# Patient Record
Sex: Female | Born: 1950
Health system: Southern US, Community
[De-identification: ages and names within clinical notes are randomized; demographics above are authoritative.]

## PROBLEM LIST (undated history)

## (undated) DIAGNOSIS — E559 Vitamin D deficiency, unspecified: Secondary | ICD-10-CM

## (undated) DIAGNOSIS — H269 Unspecified cataract: Secondary | ICD-10-CM

## (undated) DIAGNOSIS — E876 Hypokalemia: Secondary | ICD-10-CM

## (undated) DIAGNOSIS — J302 Other seasonal allergic rhinitis: Secondary | ICD-10-CM

## (undated) DIAGNOSIS — E785 Hyperlipidemia, unspecified: Secondary | ICD-10-CM

## (undated) DIAGNOSIS — Z5189 Encounter for other specified aftercare: Secondary | ICD-10-CM

## (undated) DIAGNOSIS — R011 Cardiac murmur, unspecified: Secondary | ICD-10-CM

## (undated) DIAGNOSIS — I10 Essential (primary) hypertension: Secondary | ICD-10-CM

## (undated) DIAGNOSIS — H9191 Unspecified hearing loss, right ear: Secondary | ICD-10-CM

## (undated) HISTORY — DX: Unspecified hearing loss, right ear: H91.91

## (undated) HISTORY — DX: Cardiac murmur, unspecified: R01.1

## (undated) HISTORY — DX: Other seasonal allergic rhinitis: J30.2

## (undated) HISTORY — DX: Vitamin D deficiency, unspecified: E55.9

## (undated) HISTORY — DX: Hypokalemia: E87.6

## (undated) HISTORY — PX: BUNIONECTOMY: SHX129

## (undated) HISTORY — DX: Hyperlipidemia, unspecified: E78.5

## (undated) HISTORY — DX: Encounter for other specified aftercare: Z51.89

## (undated) HISTORY — PX: COLONOSCOPY: SHX174

## (undated) HISTORY — PX: APPENDECTOMY: SHX54

## (undated) HISTORY — DX: Unspecified cataract: H26.9

---

## 1998-06-29 ENCOUNTER — Encounter (INDEPENDENT_AMBULATORY_CARE_PROVIDER_SITE_OTHER): Payer: Self-pay | Admitting: *Deleted

## 1998-07-07 ENCOUNTER — Encounter: Admission: RE | Admit: 1998-07-07 | Discharge: 1998-07-07 | Payer: Self-pay | Admitting: Family Medicine

## 1998-07-28 ENCOUNTER — Encounter: Admission: RE | Admit: 1998-07-28 | Discharge: 1998-07-28 | Payer: Self-pay | Admitting: Family Medicine

## 1998-08-12 ENCOUNTER — Encounter: Admission: RE | Admit: 1998-08-12 | Discharge: 1998-08-12 | Payer: Self-pay | Admitting: Family Medicine

## 1998-11-30 ENCOUNTER — Encounter: Admission: RE | Admit: 1998-11-30 | Discharge: 1998-11-30 | Payer: Self-pay | Admitting: Family Medicine

## 1998-12-15 ENCOUNTER — Encounter: Admission: RE | Admit: 1998-12-15 | Discharge: 1998-12-15 | Payer: Self-pay | Admitting: Family Medicine

## 1999-05-17 ENCOUNTER — Encounter: Admission: RE | Admit: 1999-05-17 | Discharge: 1999-05-17 | Payer: Self-pay | Admitting: Family Medicine

## 1999-09-17 ENCOUNTER — Emergency Department (HOSPITAL_COMMUNITY): Admission: EM | Admit: 1999-09-17 | Discharge: 1999-09-17 | Payer: Self-pay

## 2000-01-11 ENCOUNTER — Encounter: Admission: RE | Admit: 2000-01-11 | Discharge: 2000-01-11 | Payer: Self-pay | Admitting: Family Medicine

## 2000-02-07 ENCOUNTER — Encounter: Admission: RE | Admit: 2000-02-07 | Discharge: 2000-02-07 | Payer: Self-pay | Admitting: Family Medicine

## 2000-04-21 ENCOUNTER — Encounter: Payer: Self-pay | Admitting: Emergency Medicine

## 2000-04-21 ENCOUNTER — Emergency Department (HOSPITAL_COMMUNITY): Admission: EM | Admit: 2000-04-21 | Discharge: 2000-04-21 | Payer: Self-pay | Admitting: Emergency Medicine

## 2001-10-13 ENCOUNTER — Emergency Department (HOSPITAL_COMMUNITY): Admission: EM | Admit: 2001-10-13 | Discharge: 2001-10-13 | Payer: Self-pay

## 2003-09-20 ENCOUNTER — Emergency Department (HOSPITAL_COMMUNITY): Admission: EM | Admit: 2003-09-20 | Discharge: 2003-09-20 | Payer: Self-pay | Admitting: Emergency Medicine

## 2003-12-31 ENCOUNTER — Emergency Department (HOSPITAL_COMMUNITY): Admission: EM | Admit: 2003-12-31 | Discharge: 2003-12-31 | Payer: Self-pay | Admitting: *Deleted

## 2004-02-29 ENCOUNTER — Ambulatory Visit (HOSPITAL_COMMUNITY): Admission: RE | Admit: 2004-02-29 | Discharge: 2004-02-29 | Payer: Self-pay | Admitting: Family Medicine

## 2004-05-11 ENCOUNTER — Ambulatory Visit (HOSPITAL_COMMUNITY): Admission: RE | Admit: 2004-05-11 | Discharge: 2004-05-11 | Payer: Self-pay | Admitting: Family Medicine

## 2004-08-04 ENCOUNTER — Ambulatory Visit: Payer: Self-pay | Admitting: *Deleted

## 2004-08-16 ENCOUNTER — Ambulatory Visit: Payer: Self-pay | Admitting: Family Medicine

## 2004-08-18 ENCOUNTER — Ambulatory Visit: Payer: Self-pay | Admitting: Family Medicine

## 2004-09-18 ENCOUNTER — Ambulatory Visit: Payer: Self-pay | Admitting: Family Medicine

## 2004-09-28 ENCOUNTER — Ambulatory Visit: Payer: Self-pay | Admitting: Family Medicine

## 2004-12-19 ENCOUNTER — Ambulatory Visit: Payer: Self-pay | Admitting: Family Medicine

## 2005-01-25 ENCOUNTER — Ambulatory Visit: Payer: Self-pay | Admitting: Family Medicine

## 2005-02-19 ENCOUNTER — Ambulatory Visit: Payer: Self-pay | Admitting: Internal Medicine

## 2005-03-09 ENCOUNTER — Ambulatory Visit: Payer: Self-pay | Admitting: Family Medicine

## 2005-06-13 ENCOUNTER — Ambulatory Visit: Payer: Self-pay | Admitting: Family Medicine

## 2005-09-11 ENCOUNTER — Ambulatory Visit: Payer: Self-pay | Admitting: Family Medicine

## 2005-09-26 ENCOUNTER — Ambulatory Visit: Payer: Self-pay | Admitting: Internal Medicine

## 2005-11-29 ENCOUNTER — Ambulatory Visit: Payer: Self-pay | Admitting: Family Medicine

## 2006-02-26 ENCOUNTER — Emergency Department (HOSPITAL_COMMUNITY): Admission: EM | Admit: 2006-02-26 | Discharge: 2006-02-26 | Payer: Self-pay | Admitting: Emergency Medicine

## 2006-03-05 ENCOUNTER — Ambulatory Visit: Payer: Self-pay | Admitting: Family Medicine

## 2006-06-11 ENCOUNTER — Emergency Department (HOSPITAL_COMMUNITY): Admission: EM | Admit: 2006-06-11 | Discharge: 2006-06-11 | Payer: Self-pay | Admitting: Emergency Medicine

## 2006-10-08 ENCOUNTER — Emergency Department (HOSPITAL_COMMUNITY): Admission: EM | Admit: 2006-10-08 | Discharge: 2006-10-08 | Payer: Self-pay | Admitting: Emergency Medicine

## 2006-12-27 ENCOUNTER — Encounter (INDEPENDENT_AMBULATORY_CARE_PROVIDER_SITE_OTHER): Payer: Self-pay | Admitting: *Deleted

## 2009-10-24 ENCOUNTER — Encounter: Payer: Self-pay | Admitting: Family Medicine

## 2009-11-24 ENCOUNTER — Encounter: Payer: Self-pay | Admitting: *Deleted

## 2010-11-19 ENCOUNTER — Encounter: Payer: Self-pay | Admitting: Internal Medicine

## 2010-11-28 NOTE — Miscellaneous (Signed)
Summary: Do Not Reschedule  Pt missed NP appt.  Per [facility] policy is not allowed to reschedule.  Dennison Nancy RN  November 24, 2009 10:40 AM

## 2015-02-18 ENCOUNTER — Encounter (HOSPITAL_COMMUNITY): Payer: Self-pay | Admitting: Emergency Medicine

## 2015-02-18 ENCOUNTER — Emergency Department (HOSPITAL_COMMUNITY): Payer: Self-pay

## 2015-02-18 ENCOUNTER — Emergency Department (HOSPITAL_COMMUNITY)
Admission: EM | Admit: 2015-02-18 | Discharge: 2015-02-18 | Disposition: A | Discharge status: Home / Self Care | Payer: Self-pay | Attending: Emergency Medicine | Admitting: Emergency Medicine

## 2015-02-18 DIAGNOSIS — Z9889 Other specified postprocedural states: Secondary | ICD-10-CM | POA: Insufficient documentation

## 2015-02-18 DIAGNOSIS — Z87891 Personal history of nicotine dependence: Secondary | ICD-10-CM | POA: Insufficient documentation

## 2015-02-18 DIAGNOSIS — R079 Chest pain, unspecified: Secondary | ICD-10-CM | POA: Insufficient documentation

## 2015-02-18 DIAGNOSIS — I10 Essential (primary) hypertension: Secondary | ICD-10-CM | POA: Insufficient documentation

## 2015-02-18 DIAGNOSIS — J069 Acute upper respiratory infection, unspecified: Secondary | ICD-10-CM | POA: Insufficient documentation

## 2015-02-18 HISTORY — DX: Essential (primary) hypertension: I10

## 2015-02-18 MED ORDER — BENZONATATE 100 MG PO CAPS: 100.0000 mg | ORAL_CAPSULE | Freq: Three times a day (TID) | ORAL | Status: DC

## 2015-02-18 NOTE — ED Provider Notes (Signed)
CSN: [id number]     Arrival date & time 02/18/15  8341 History   First MD Initiated Contact with Patient 02/18/15 0901     Chief Complaint  Patient presents with  . Cough     (Consider location/radiation/quality/duration/timing/severity/associated sxs/prior Treatment) HPI Comments: Patient presents with cough. She states the cough started 2 days ago. Spin worsening since that time. Her cough is productive of some yellow sputum. She has some soreness in the center of her chest which she describes as a sharp pain that only happens with the coughing. She denies any pain with breathing. She denies any shortness of breath. She denies any fevers but she is having some chills. She has been using some Mucinex without relief. She denies any leg pain or swelling. She denies any history of lung disease. She's a former smoker but quit smoking about 6 years ago.  Patient is a 64 y.o. female presenting with cough.  Cough Associated symptoms: chest pain   Associated symptoms: no chills, no diaphoresis, no fever, no headaches, no rash, no rhinorrhea and no shortness of breath     Past Medical History  Diagnosis Date  . Hypertension    History reviewed. No pertinent past surgical history. No family history on file. History  Substance Use Topics  . Smoking status: Former Smoker -- 2.00 packs/day for 20 years    Types: Cigarettes    Quit date: 01/27/2009  . Smokeless tobacco: Not on file  . Alcohol Use: No   OB History    No data available     Review of Systems  Constitutional: Negative for fever, chills, diaphoresis and fatigue.  HENT: Negative for congestion, rhinorrhea and sneezing.   Eyes: Negative.   Respiratory: Positive for cough. Negative for chest tightness and shortness of breath.   Cardiovascular: Positive for chest pain. Negative for leg swelling.  Gastrointestinal: Negative for nausea, vomiting, abdominal pain, diarrhea and blood in stool.  Genitourinary: Negative for frequency,  hematuria, flank pain and difficulty urinating.  Musculoskeletal: Negative for back pain and arthralgias.  Skin: Negative for rash.  Neurological: Negative for dizziness, speech difficulty, weakness, numbness and headaches.      Allergies  Review of patient's allergies indicates no known allergies.  Home Medications   Prior to Admission medications   Medication Sig Start Date End Date Taking? Authorizing Provider  dextromethorphan-guaiFENesin (MUCINEX DM) 30-600 MG per 12 hr tablet Take 1 tablet by mouth every 12 (twelve) hours as needed for cough (congestion).    Yes Historical Provider, MD  hydrochlorothiazide (HYDRODIURIL) 25 MG tablet Take 25 mg by mouth daily.   Yes Historical Provider, MD  naproxen sodium (ANAPROX) 220 MG tablet Take 220 mg by mouth every 6 (six) hours as needed (headache).   Yes Historical Provider, MD  pravastatin (PRAVACHOL) 80 MG tablet Take 80 mg by mouth daily.   Yes Historical Provider, MD  verapamil (VERELAN PM) 360 MG 24 hr capsule Take 360 mg by mouth daily.   Yes Historical Provider, MD  benzonatate (TESSALON) 100 MG capsule Take 1 capsule (100 mg total) by mouth every 8 (eight) hours. 02/18/15   Malvin Johns, MD   BP 121/74 mmHg  Pulse 90  Temp(Src) 98.5 F (36.9 C) (Oral)  Resp 21  Ht 5\' 4"  (1.626 m)  Wt 209 lb (94.802 kg)  BMI 35.86 kg/m2  SpO2 94% Physical Exam  Constitutional: She is oriented to person, place, and time. She appears well-developed and well-nourished.  HENT:  Head: Normocephalic and atraumatic.  Right Ear: External ear normal.  Left Ear: External ear normal.  Mouth/Throat: Oropharynx is clear and moist.  Eyes: Pupils are equal, round, and reactive to light.  Neck: Normal range of motion. Neck supple.  Cardiovascular: Normal rate, regular rhythm and normal heart sounds.   Pulmonary/Chest: Effort normal and breath sounds normal. No respiratory distress. She has no wheezes. She has no rales. She exhibits tenderness (mild  tenderness to the center of her chest on palpation).  Abdominal: Soft. Bowel sounds are normal. There is no tenderness. There is no rebound and no guarding.  Musculoskeletal: Normal range of motion. She exhibits no edema.  No edema or calf tenderness  Lymphadenopathy:    She has no cervical adenopathy.  Neurological: She is alert and oriented to person, place, and time.  Skin: Skin is warm and dry. No rash noted.  Psychiatric: She has a normal mood and affect.    ED Course  Procedures (including critical care time) Labs Review Labs Reviewed - No data to display  Imaging Review Dg Chest 2 View  02/18/2015   CLINICAL DATA:  64 year old female with a history of cough  EXAM: CHEST - 2 VIEW  COMPARISON:  No prior chest x-ray  FINDINGS: Cardiomediastinal silhouette within normal limits in size and contour.  Atherosclerotic calcification of the aortic arch.  No pulmonary vascular congestion. No pneumothorax. No pleural effusion. No confluent airspace disease.  No displaced fracture.  Unremarkable appearance of the upper abdomen.  IMPRESSION: No radiographic evidence of acute cardiopulmonary disease.  Atherosclerosis.  Signed,  Dulcy Fanny. Earleen Newport, DO  [facility]  [facility]   Electronically Signed   By: Corrie Mckusick D.O.   On: 02/18/2015 09:47     EKG Interpretation   Date/Time:  Friday February 18 2015 09:10:41 EDT Ventricular Rate:  100 PR Interval:  127 QRS Duration: 90 QT Interval:  388 QTC Calculation: 500 R Axis:   72 Text Interpretation:  Sinus tachycardia Borderline T abnormalities,  anterior leads Prolonged QT interval No old tracing to compare Confirmed  by Caliegh Middlekauff  MD, Kayvion Arneson ([id number]) on 02/18/2015 9:21:43 AM      MDM   Final diagnoses:  URI (upper respiratory infection)    Patient has no evidence of pneumonia. Her lungs are clear on exam. She has normal oxygen saturations. Her chest pain is reproducible and her EKG does not show  any ischemic changes. She has no suggestions of a PE. She likely has upper respiratory infection. She was discharged home in good condition. She was given prescription for [employer]. She was advised that she can continue using the Mucinex. She was advised to follow-up with her primary care physician if her symptoms are not improving or return here as needed for any worsening symptoms.    Malvin Johns, MD 02/18/15 [phone]

## 2015-02-18 NOTE — ED Notes (Addendum)
Pt states her symptoms started 2 days ago.  She has a cough associated with some mucous. The pain she states in her chest radiates to lower back, and she feels its related to her coughing.  She denies any SOB. Pt is alert and orientated.  Pt reports no BN & V.  She has taken Mucinex with no relief

## 2015-02-18 NOTE — ED Notes (Signed)
Patient is alert and orientated to discharge instructions.  She rates her pain score to a 5 in chest but it is from coughing.

## 2015-02-18 NOTE — Discharge Instructions (Signed)
Cough, Adult  A cough is a reflex that helps clear your throat and airways. It can help heal the body or may be a reaction to an irritated airway. A cough may only last 2 or 3 weeks (acute) or may last more than 8 weeks (chronic).  CAUSES Acute cough:  Viral or bacterial infections. Chronic cough:  Infections.  Allergies.  Asthma.  Post-nasal drip.  Smoking.  Heartburn or acid reflux.  Some medicines.  Chronic lung problems (COPD).  Cancer. SYMPTOMS   Cough.  Fever.  Chest pain.  Increased breathing rate.  High-pitched whistling sound when breathing (wheezing).  Colored mucus that you cough up (sputum). TREATMENT   A bacterial cough may be treated with antibiotic medicine.  A viral cough must run its course and will not respond to antibiotics.  Your caregiver may recommend other treatments if you have a chronic cough. HOME CARE INSTRUCTIONS   Only take over-the-counter or prescription medicines for pain, discomfort, or fever as directed by your caregiver. Use cough suppressants only as directed by your caregiver.  Use a cold steam vaporizer or humidifier in your bedroom or home to help loosen secretions.  Sleep in a semi-upright position if your cough is worse at night.  Rest as needed.  Stop smoking if you smoke. SEEK IMMEDIATE MEDICAL CARE IF:   You have pus in your sputum.  Your cough starts to worsen.  You cannot control your cough with suppressants and are losing sleep.  You begin coughing up blood.  You have difficulty breathing.  You develop pain which is getting worse or is uncontrolled with medicine.  You have a fever. MAKE SURE YOU:   Understand these instructions.  Will watch your condition.  Will get help right away if you are not doing well or get worse. Document Released: 04/13/2011 Document Revised: 01/07/2012 Document Reviewed: 04/13/2011 ExitCare Patient Information 2015 [employer]. This information is not intended  to replace advice given to you by your health care provider. Make sure you discuss any questions you have with your health care provider.  

## 2015-12-05 DIAGNOSIS — E669 Obesity, unspecified: Secondary | ICD-10-CM | POA: Insufficient documentation

## 2016-05-29 DIAGNOSIS — R7303 Prediabetes: Secondary | ICD-10-CM | POA: Insufficient documentation

## 2016-05-29 DIAGNOSIS — M545 Low back pain, unspecified: Secondary | ICD-10-CM | POA: Insufficient documentation

## 2016-07-09 ENCOUNTER — Emergency Department (HOSPITAL_COMMUNITY): Payer: Self-pay

## 2016-07-09 ENCOUNTER — Encounter (HOSPITAL_COMMUNITY): Payer: Self-pay

## 2016-07-09 DIAGNOSIS — S92312A Displaced fracture of first metatarsal bone, left foot, initial encounter for closed fracture: Secondary | ICD-10-CM | POA: Insufficient documentation

## 2016-07-09 DIAGNOSIS — Y9389 Activity, other specified: Secondary | ICD-10-CM | POA: Insufficient documentation

## 2016-07-09 DIAGNOSIS — Y999 Unspecified external cause status: Secondary | ICD-10-CM | POA: Insufficient documentation

## 2016-07-09 DIAGNOSIS — Z7982 Long term (current) use of aspirin: Secondary | ICD-10-CM | POA: Insufficient documentation

## 2016-07-09 DIAGNOSIS — S82492A Other fracture of shaft of left fibula, initial encounter for closed fracture: Secondary | ICD-10-CM | POA: Insufficient documentation

## 2016-07-09 DIAGNOSIS — Z87891 Personal history of nicotine dependence: Secondary | ICD-10-CM | POA: Insufficient documentation

## 2016-07-09 DIAGNOSIS — S92325A Nondisplaced fracture of second metatarsal bone, left foot, initial encounter for closed fracture: Secondary | ICD-10-CM | POA: Insufficient documentation

## 2016-07-09 DIAGNOSIS — I10 Essential (primary) hypertension: Secondary | ICD-10-CM | POA: Insufficient documentation

## 2016-07-09 DIAGNOSIS — Y92009 Unspecified place in unspecified non-institutional (private) residence as the place of occurrence of the external cause: Secondary | ICD-10-CM | POA: Insufficient documentation

## 2016-07-09 DIAGNOSIS — Z79899 Other long term (current) drug therapy: Secondary | ICD-10-CM | POA: Insufficient documentation

## 2016-07-09 DIAGNOSIS — S92332A Displaced fracture of third metatarsal bone, left foot, initial encounter for closed fracture: Secondary | ICD-10-CM | POA: Insufficient documentation

## 2016-07-09 DIAGNOSIS — W010XXA Fall on same level from slipping, tripping and stumbling without subsequent striking against object, initial encounter: Secondary | ICD-10-CM | POA: Insufficient documentation

## 2016-07-09 NOTE — ED Triage Notes (Signed)
Pt tripped over the gas pump this evening and she complains of left ankle and foot pain

## 2016-07-10 ENCOUNTER — Emergency Department (HOSPITAL_COMMUNITY): Payer: Self-pay

## 2016-07-10 ENCOUNTER — Emergency Department (HOSPITAL_COMMUNITY)
Admission: EM | Admit: 2016-07-10 | Discharge: 2016-07-10 | Disposition: A | Discharge status: Home / Self Care | Payer: Self-pay | Attending: Emergency Medicine | Admitting: Emergency Medicine

## 2016-07-10 ENCOUNTER — Other Ambulatory Visit: Payer: Self-pay | Admitting: Orthopaedic Surgery

## 2016-07-10 DIAGNOSIS — S92302A Fracture of unspecified metatarsal bone(s), left foot, initial encounter for closed fracture: Secondary | ICD-10-CM

## 2016-07-10 DIAGNOSIS — S82402A Unspecified fracture of shaft of left fibula, initial encounter for closed fracture: Secondary | ICD-10-CM

## 2016-07-10 MED ORDER — OXYCODONE-ACETAMINOPHEN 5-325 MG PO TABS
1.0000 | ORAL_TABLET | Freq: Once | ORAL | Status: AC
  Administered 2016-07-10: 1 via ORAL
  Filled 2016-07-10: qty 1

## 2016-07-10 MED ORDER — OXYCODONE-ACETAMINOPHEN 5-325 MG PO TABS: 2.0000 | ORAL_TABLET | ORAL | 0 refills | Status: DC | PRN

## 2016-07-10 MED ORDER — OXYCODONE-ACETAMINOPHEN 5-325 MG PO TABS
1.0000 | ORAL_TABLET | Freq: Once | ORAL | Status: AC
Start: 2016-07-10 — End: 2016-07-10
  Administered 2016-07-10: 1 via ORAL
  Filled 2016-07-10: qty 1

## 2016-07-10 MED ORDER — IBUPROFEN 200 MG PO TABS
600.0000 mg | ORAL_TABLET | Freq: Once | ORAL | Status: AC
Start: 2016-07-10 — End: 2016-07-10
  Administered 2016-07-10: 600 mg via ORAL
  Filled 2016-07-10: qty 3

## 2016-07-10 NOTE — ED Provider Notes (Signed)
[facility] DEPT Provider Note   CSN: [id number] Arrival date & time: 07/09/16  2249  By signing my name below, I, Ephriam Jenkins, attest that this documentation has been prepared under the direction and in the presence of [employer].  Electronically Signed: Ephriam Jenkins, ED Scribe. 07/10/16. 1:15 AM.  History   Chief Complaint Chief Complaint  Patient presents with  . Foot Pain    HPI HPI Comments: Teresa Collier is a 64 y.o. female who presents to the Emergency Department s/p an injury to her left foot approximately two hours ago. 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 She currently reports significant pain and swelling to her left foot with exacerbating pain upon ROM. She also reports exacerbated pain while bearing weight. Pt denies any deceased sensation of the extremity.      Past Medical History:  Diagnosis Date  . Hypertension     There are no active problems to display for this patient.   History reviewed. No pertinent surgical history.  OB History    No data available       Home Medications    Prior to Admission medications   Medication Sig Start Date End Date Taking? Authorizing Provider  aspirin-acetaminophen-caffeine (EXCEDRIN MIGRAINE) [phone] MG tablet Take 1-2 tablets by mouth every 6 (six) hours as needed for headache.   Yes Historical Provider, MD  hydrochlorothiazide (HYDRODIURIL) 25 MG tablet Take 25 mg by mouth daily.   Yes Historical Provider, MD  Multiple Vitamin (MULTIVITAMIN WITH MINERALS) TABS tablet Take 1 tablet by mouth daily.   Yes Historical Provider, MD  pravastatin (PRAVACHOL) 80 MG tablet Take 80 mg by mouth daily.   Yes Historical Provider, MD  verapamil (VERELAN PM) 360 MG 24 hr capsule Take 360 mg by mouth daily.   Yes Historical Provider, MD  oxyCODONE-acetaminophen (PERCOCET/ROXICET) 5-325 MG tablet Take 2 tablets by mouth every 4 (four) hours as  needed for severe pain. 07/10/16   Okey Regal, PA-C    Family History History reviewed. No pertinent family history.  Social History Social History  Substance Use Topics  . Smoking status: Former Smoker    Packs/day: 2.00    Years: 20.00    Types: Cigarettes    Quit date: 01/27/2009  . Smokeless tobacco: Never Used  . Alcohol use No     Allergies   Review of patient's allergies indicates no known allergies.   Review of Systems Review of Systems  Musculoskeletal: Positive for arthralgias (left foot, left ankle) and joint swelling (left ankle).  Neurological: Negative for numbness.  All other systems reviewed and are negative.    Physical Exam Updated Vital Signs BP 158/78 (BP Location: Left Arm)   Pulse 84   Temp 98.2 F (36.8 C) (Oral)   Resp 20   Ht 5\' 3"  (1.6 m)   Wt 180 lb (81.6 kg)   SpO2 97%   BMI 31.89 kg/m   Physical Exam  Constitutional: She is oriented to person, place, and time. She appears well-developed and well-nourished. No distress.  HENT:  Head: Normocephalic and atraumatic.  Neck: Normal range of motion.  Pulmonary/Chest: Effort normal.  Musculoskeletal: She exhibits edema and tenderness.  Significant swelling to left dorsal foot and lateral ankle. ROM intact. TTP of the dorsal foot and lateral ankle. Cap refill less than 3 sec. No signs of compartment syndrome. Sensation intact. No TTP of proximal fibula  Neurological: She is  alert and oriented to person, place, and time.  Skin: Skin is warm and dry. She is not diaphoretic.  Psychiatric: She has a normal mood and affect. Judgment normal.  Nursing note and vitals reviewed.    ED Treatments / Results  DIAGNOSTIC STUDIES: Oxygen Saturation is 97% on RA, normal by my interpretation.  COORDINATION OF CARE: 1:04 AM-Will order imaging of the foot. Discussed treatment plan with pt at bedside and pt agreed to plan.   Labs (all labs ordered are listed, but only abnormal results are  displayed) Labs Reviewed - No data to display  EKG  EKG Interpretation None       Radiology Dg Ankle Complete Left  Result Date: 07/09/2016 CLINICAL DATA:  Trip and fall injury at home tonight. EXAM: LEFT ANKLE COMPLETE - 3+ VIEW COMPARISON:  None. FINDINGS: There is an acute oblique distal fibular fracture with 1 cortex width posterolateral displacement. Good alignment. Mortise is symmetric. No other acute fracture is evident. IMPRESSION: Acute fracture of the distal fibula Electronically Signed   By: Andreas Newport M.D.   On: 07/09/2016 23:49   Ct Foot Left Wo Contrast  Result Date: 07/10/2016 CLINICAL DATA:  Tripped over gas pump this evening. EXAM: CT OF THE LEFT FOOT WITHOUT CONTRAST TECHNIQUE: Multidetector CT imaging of the left foot was performed according to the standard protocol. Multiplanar CT image reconstructions were also generated. COMPARISON:  None. FINDINGS: There is an acute nondisplaced posterior malleolar fracture in the coronal plane. Distal fibular fracture is again evident with 1 mm fracture fragment separation. Subtalar joints are intact.  Talus and calcaneus are intact. Minute nondisplaced chip fracture at the dorsal lateral margin of the cuboid. Mildly comminuted first metatarsal base fracture with 9 mm dorsal-medial displacement and subluxation at the first tarsometatarsal articulation. There also is a nondisplaced well aligned second metatarsal base fracture without disruption of the second tarsometatarsal articulation. The other metatarsal bases and tarsometatarsal articulations are intact. Mildly impacted fractures of the second and third metatarsal necks. IMPRESSION: 1. Mildly comminuted first metatarsal base fracture with dorsal-medial articular distraction of the first tarsometatarsal joint. The other tarsometatarsal joints are intact although there is a nondisplaced second metatarsal base fracture. 2. Mildly impacted fractures of the second and third metatarsal  necks. 3. Coronal fractures through the distal fibula and posterior malleolus. Electronically Signed   By: Andreas Newport M.D.   On: 07/10/2016 04:36   Dg Foot Complete Left  Result Date: 07/10/2016 CLINICAL DATA:  Foot pain and swelling. Injury, trip and fall over a gas line. Bruising and swelling. Known ankle fracture. EXAM: LEFT FOOT - COMPLETE 3+ VIEW COMPARISON:  Ankle radiographs 2 hours prior FINDINGS: Displaced proximal first metatarsal fracture without 6 mm of osseous distraction distally, mild comminution proximally. Fracture involves the tarsal metatarsal articulation, with likely 1 cm articular distraction. Comminuted fracture of the second metatarsal head neck extends into the metatarsal phalangeal joint. Impaction fracture of the third metatarsal neck without definite intra-articular extension. Dorsal soft tissue edema is seen. Distal fibular fractures better assessed on ankle exam. IMPRESSION: 1. Displaced mildly comminuted intra-articular fracture proximal first metatarsal involving the tarsal metatarsal joint. 2. Second and third metatarsal neck fractures, second metatarsal fracture extends to the metatarsal phalangeal joint. Electronically Signed   By: Jeb Levering M.D.   On: 07/10/2016 01:48    Procedures Procedures (including critical care time)  Medications Ordered in ED Medications  ibuprofen (ADVIL,MOTRIN) tablet 600 mg (600 mg Oral Given 07/10/16 0145)  oxyCODONE-acetaminophen (PERCOCET/ROXICET) 5-325  MG per tablet 1 tablet (1 tablet Oral Given 07/10/16 0145)  oxyCODONE-acetaminophen (PERCOCET/ROXICET) 5-325 MG per tablet 1 tablet (1 tablet Oral Given 07/10/16 0519)     Initial Impression / Assessment and Plan / ED Course  I have reviewed the triage vital signs and the nursing notes.  Pertinent labs & imaging results that were available during my care of the patient were reviewed by me and considered in my medical decision making (see chart for details).  Clinical  Course     Final Clinical Impressions(s) / ED Diagnoses   Final diagnoses:  Metatarsal fracture, left, closed, initial encounter  Fibula fracture, left, closed, initial encounter   Labs:  Imaging:CT foot, DG foot, DG ankle  Consults: XU  Therapeutics: Percocet  Discharge Meds: Percocet  Assessment/Plan:  65 year old female presents today with significant lower extremity fractures. Patient has 3 metatarsal fractures with intra-articular articular involvement with questionable Lisfranc injury. Patient also has distal fibula fracture. Patient consult with on-call orthopedics Dr. Erlinda Hong who instructed to have CT foot, Jones dressing and follow-up in the clinic. Patient has no signs of compartment syndrome, she will be discharged home with close follow-up, and strict return precautions. She verbalized understanding and agreement to today's plan had no further questions or concerns at time of discharge    New Prescriptions Discharge Medication List as of 07/10/2016  4:53 AM    I personally performed the services described in this documentation, which was scribed in my presence. The recorded information has been reviewed and is accurate.    Okey Regal, PA-C 07/10/16 0548    Veatrice Kells, MD 07/10/16 [phone]

## 2016-07-10 NOTE — ED Notes (Signed)
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 Pt reports pain with movement and unable to bear weight.

## 2016-07-10 NOTE — ED Notes (Signed)
Bulky jones dressing applied to left foot/ankle.

## 2016-07-10 NOTE — ED Notes (Signed)
Pt reporting improvement in pain currently awaiting consult with Orthopedics.

## 2016-07-10 NOTE — ED Notes (Signed)
Verbal order from Angie PA for Motrin 600mg  PO and Percocet 5/325mg  PO

## 2016-07-10 NOTE — Discharge Instructions (Signed)
Please contact Dr. Erlinda Hong today and schedule follow-up evaluation. Please return the emergency room if any new or worsening signs or symptoms present

## 2016-07-11 ENCOUNTER — Encounter (HOSPITAL_BASED_OUTPATIENT_CLINIC_OR_DEPARTMENT_OTHER): Payer: Self-pay | Admitting: *Deleted

## 2016-07-12 ENCOUNTER — Encounter (HOSPITAL_BASED_OUTPATIENT_CLINIC_OR_DEPARTMENT_OTHER)
Admission: RE | Admit: 2016-07-12 | Discharge: 2016-07-12 | Disposition: A | Discharge status: Home / Self Care | Payer: Self-pay | Source: Ambulatory Visit | Attending: Orthopaedic Surgery | Admitting: Orthopaedic Surgery

## 2016-07-12 ENCOUNTER — Other Ambulatory Visit: Payer: Self-pay

## 2016-07-12 DIAGNOSIS — I10 Essential (primary) hypertension: Secondary | ICD-10-CM | POA: Insufficient documentation

## 2016-07-12 LAB — BASIC METABOLIC PANEL
Anion gap: 8 (ref 5–15)
BUN: 8 mg/dL (ref 6–20)
CHLORIDE: 104 mmol/L (ref 101–111)
CO2: 29 mmol/L (ref 22–32)
Calcium: 9.9 mg/dL (ref 8.9–10.3)
Creatinine, Ser: 0.96 mg/dL (ref 0.44–1.00)
GFR calc Af Amer: >60 mL/min (ref >60)
GFR calc non Af Amer: >60 mL/min (ref >60)
GLUCOSE: 87 mg/dL (ref 65–99)
POTASSIUM: 4.9 mmol/L (ref 3.5–5.1)
Sodium: 141 mmol/L (ref 135–145)

## 2016-07-12 NOTE — Progress Notes (Signed)
ekg reviewed by dr Zenia Resides no further treatment

## 2016-07-17 ENCOUNTER — Encounter (HOSPITAL_BASED_OUTPATIENT_CLINIC_OR_DEPARTMENT_OTHER): Payer: Self-pay | Admitting: *Deleted

## 2016-07-25 ENCOUNTER — Encounter (HOSPITAL_BASED_OUTPATIENT_CLINIC_OR_DEPARTMENT_OTHER)
Admission: RE | Disposition: A | Discharge status: Home / Self Care | Payer: Self-pay | Source: Ambulatory Visit | Attending: Orthopaedic Surgery

## 2016-07-25 ENCOUNTER — Ambulatory Visit (HOSPITAL_BASED_OUTPATIENT_CLINIC_OR_DEPARTMENT_OTHER): Payer: Self-pay | Admitting: Anesthesiology

## 2016-07-25 ENCOUNTER — Encounter (HOSPITAL_BASED_OUTPATIENT_CLINIC_OR_DEPARTMENT_OTHER): Payer: Self-pay | Admitting: *Deleted

## 2016-07-25 ENCOUNTER — Ambulatory Visit (HOSPITAL_BASED_OUTPATIENT_CLINIC_OR_DEPARTMENT_OTHER)
Admission: RE | Admit: 2016-07-25 | Discharge: 2016-07-25 | Disposition: A | Discharge status: Home / Self Care | Payer: Self-pay | Source: Ambulatory Visit | Attending: Orthopaedic Surgery | Admitting: Orthopaedic Surgery

## 2016-07-25 ENCOUNTER — Ambulatory Visit (HOSPITAL_COMMUNITY): Payer: Self-pay

## 2016-07-25 DIAGNOSIS — S92312A Displaced fracture of first metatarsal bone, left foot, initial encounter for closed fracture: Secondary | ICD-10-CM | POA: Insufficient documentation

## 2016-07-25 DIAGNOSIS — X58XXXA Exposure to other specified factors, initial encounter: Secondary | ICD-10-CM | POA: Insufficient documentation

## 2016-07-25 DIAGNOSIS — S82842A Displaced bimalleolar fracture of left lower leg, initial encounter for closed fracture: Secondary | ICD-10-CM | POA: Insufficient documentation

## 2016-07-25 DIAGNOSIS — Z419 Encounter for procedure for purposes other than remedying health state, unspecified: Secondary | ICD-10-CM

## 2016-07-25 DIAGNOSIS — I10 Essential (primary) hypertension: Secondary | ICD-10-CM | POA: Insufficient documentation

## 2016-07-25 DIAGNOSIS — Z87891 Personal history of nicotine dependence: Secondary | ICD-10-CM | POA: Insufficient documentation

## 2016-07-25 HISTORY — PX: ORIF ANKLE FRACTURE: SHX5408

## 2016-07-25 HISTORY — PX: OPEN REDUCTION INTERNAL FIXATION (ORIF) FOOT LISFRANC FRACTURE: SHX5990

## 2016-07-25 SURGERY — OPEN REDUCTION INTERNAL FIXATION (ORIF) ANKLE FRACTURE
Anesthesia: Regional | Site: Foot | Laterality: Left

## 2016-07-25 MED ORDER — GLYCOPYRROLATE 0.2 MG/ML IJ SOLN: 0.2000 mg | Freq: Once | INTRAMUSCULAR | Status: DC | PRN

## 2016-07-25 MED ORDER — FENTANYL CITRATE (PF) 100 MCG/2ML IJ SOLN
50.0000 ug | INTRAMUSCULAR | Status: AC | PRN
  Administered 2016-07-25 (×2): 25 ug via INTRAVENOUS
  Administered 2016-07-25: 100 ug via INTRAVENOUS
  Administered 2016-07-25 (×2): 25 ug via INTRAVENOUS
  Administered 2016-07-25: 50 ug via INTRAVENOUS

## 2016-07-25 MED ORDER — CEFAZOLIN SODIUM-DEXTROSE 2-4 GM/100ML-% IV SOLN
INTRAVENOUS | Status: AC
  Filled 2016-07-25: qty 100

## 2016-07-25 MED ORDER — SENNOSIDES-DOCUSATE SODIUM 8.6-50 MG PO TABS: 1.0000 | ORAL_TABLET | Freq: Every evening | ORAL | 1 refills | Status: DC | PRN

## 2016-07-25 MED ORDER — ONDANSETRON HCL 4 MG/2ML IJ SOLN
INTRAMUSCULAR | Status: AC
  Filled 2016-07-25: qty 2

## 2016-07-25 MED ORDER — FENTANYL CITRATE (PF) 100 MCG/2ML IJ SOLN
INTRAMUSCULAR | Status: AC
Start: 2016-07-25 — End: 2016-07-25
  Filled 2016-07-25: qty 2

## 2016-07-25 MED ORDER — LIDOCAINE HCL (PF) 1 % IJ SOLN
INTRAMUSCULAR | Status: AC
  Filled 2016-07-25: qty 30

## 2016-07-25 MED ORDER — FENTANYL CITRATE (PF) 100 MCG/2ML IJ SOLN
INTRAMUSCULAR | Status: AC
  Filled 2016-07-25: qty 2

## 2016-07-25 MED ORDER — DEXAMETHASONE SODIUM PHOSPHATE 10 MG/ML IJ SOLN
INTRAMUSCULAR | Status: AC
  Filled 2016-07-25: qty 1

## 2016-07-25 MED ORDER — SCOPOLAMINE 1 MG/3DAYS TD PT72
1.0000 | MEDICATED_PATCH | Freq: Once | TRANSDERMAL | Status: DC | PRN
Start: 2016-07-25 — End: 2016-07-25

## 2016-07-25 MED ORDER — BUPIVACAINE-EPINEPHRINE (PF) 0.5% -1:200000 IJ SOLN
INTRAMUSCULAR | Status: DC | PRN
  Administered 2016-07-25: 30 mL via PERINEURAL

## 2016-07-25 MED ORDER — LACTATED RINGERS IV SOLN
INTRAVENOUS | Status: DC
  Administered 2016-07-25 (×2): via INTRAVENOUS

## 2016-07-25 MED ORDER — EPHEDRINE SULFATE 50 MG/ML IJ SOLN
INTRAMUSCULAR | Status: DC | PRN
  Administered 2016-07-25: 10 mg via INTRAVENOUS

## 2016-07-25 MED ORDER — OXYCODONE HCL ER 10 MG PO T12A: 10.0000 mg | EXTENDED_RELEASE_TABLET | Freq: Two times a day (BID) | ORAL | 0 refills | Status: DC

## 2016-07-25 MED ORDER — LIDOCAINE 2% (20 MG/ML) 5 ML SYRINGE
INTRAMUSCULAR | Status: AC
  Filled 2016-07-25: qty 5

## 2016-07-25 MED ORDER — MIDAZOLAM HCL 2 MG/2ML IJ SOLN
INTRAMUSCULAR | Status: AC
  Filled 2016-07-25: qty 2

## 2016-07-25 MED ORDER — MIDAZOLAM HCL 2 MG/2ML IJ SOLN
1.0000 mg | INTRAMUSCULAR | Status: DC | PRN
  Administered 2016-07-25 (×2): 1 mg via INTRAVENOUS

## 2016-07-25 MED ORDER — ONDANSETRON HCL 4 MG/2ML IJ SOLN
INTRAMUSCULAR | Status: DC | PRN
  Administered 2016-07-25: 4 mg via INTRAVENOUS

## 2016-07-25 MED ORDER — CEFAZOLIN SODIUM-DEXTROSE 2-4 GM/100ML-% IV SOLN
2.0000 g | INTRAVENOUS | Status: AC
  Administered 2016-07-25: 2 g via INTRAVENOUS

## 2016-07-25 MED ORDER — METHOCARBAMOL 750 MG PO TABS: 750.0000 mg | ORAL_TABLET | Freq: Two times a day (BID) | ORAL | 0 refills | Status: DC | PRN

## 2016-07-25 MED ORDER — BUPIVACAINE HCL (PF) 0.5 % IJ SOLN
INTRAMUSCULAR | Status: DC | PRN
  Administered 2016-07-25: 10 mL

## 2016-07-25 MED ORDER — LIDOCAINE 2% (20 MG/ML) 5 ML SYRINGE
INTRAMUSCULAR | Status: DC | PRN
  Administered 2016-07-25: 40 mg via INTRAVENOUS

## 2016-07-25 MED ORDER — HYDROMORPHONE HCL 1 MG/ML IJ SOLN: 0.2500 mg | INTRAMUSCULAR | Status: DC | PRN

## 2016-07-25 MED ORDER — ONDANSETRON HCL 4 MG PO TABS: 4.0000 mg | ORAL_TABLET | Freq: Three times a day (TID) | ORAL | 0 refills | Status: DC | PRN

## 2016-07-25 MED ORDER — PROPOFOL 10 MG/ML IV BOLUS
INTRAVENOUS | Status: DC | PRN
  Administered 2016-07-25: 150 mg via INTRAVENOUS

## 2016-07-25 MED ORDER — BUPIVACAINE HCL (PF) 0.5 % IJ SOLN
INTRAMUSCULAR | Status: AC
  Filled 2016-07-25: qty 30

## 2016-07-25 MED ORDER — DEXAMETHASONE SODIUM PHOSPHATE 4 MG/ML IJ SOLN
INTRAMUSCULAR | Status: DC | PRN
  Administered 2016-07-25: 5 mg via INTRAVENOUS

## 2016-07-25 MED ORDER — ASPIRIN EC 325 MG PO TBEC: 325.0000 mg | DELAYED_RELEASE_TABLET | Freq: Two times a day (BID) | ORAL | 0 refills | Status: DC

## 2016-07-25 MED ORDER — OXYCODONE HCL 5 MG PO TABS: 5.0000 mg | ORAL_TABLET | ORAL | 0 refills | Status: DC | PRN

## 2016-07-25 MED ORDER — PROPOFOL 10 MG/ML IV BOLUS
INTRAVENOUS | Status: AC
  Filled 2016-07-25: qty 20

## 2016-07-25 SURGICAL SUPPLY — 100 items
2.5MM DRILLBIT ×4 IMPLANT
BANDAGE ACE 4X5 VEL STRL LF (GAUZE/BANDAGES/DRESSINGS) IMPLANT
BANDAGE ACE 6X5 VEL STRL LF (GAUZE/BANDAGES/DRESSINGS) ×4 IMPLANT
BANDAGE ESMARK 6X9 LF (GAUZE/BANDAGES/DRESSINGS) ×2 IMPLANT
BIT DRILL CANN 2.7 (BIT) ×2
BIT DRILL CANN 2.7MM (BIT) ×1
BIT DRILL SRG 2.7XCANN AO CPLG (BIT) ×2 IMPLANT
BIT DRL SRG 2.7XCANN AO CPLNG (BIT) ×2
BLADE HEX COATED 2.75 (ELECTRODE) ×4 IMPLANT
BLADE SURG 15 STRL LF DISP TIS (BLADE) ×4 IMPLANT
BLADE SURG 15 STRL SS (BLADE) ×8
BNDG CMPR 9X6 STRL LF SNTH (GAUZE/BANDAGES/DRESSINGS) ×2
BNDG COHESIVE 6X5 TAN STRL LF (GAUZE/BANDAGES/DRESSINGS) ×4 IMPLANT
BNDG ESMARK 6X9 LF (GAUZE/BANDAGES/DRESSINGS) ×4
BRUSH SCRUB EZ PLAIN DRY (MISCELLANEOUS) ×4 IMPLANT
CANISTER SUCT 1200ML W/VALVE (MISCELLANEOUS) ×4 IMPLANT
COVER BACK TABLE 60X90IN (DRAPES) ×4 IMPLANT
COVER MAYO STAND STRL (DRAPES) ×4 IMPLANT
CUFF TOURNIQUET SINGLE 34IN LL (TOURNIQUET CUFF) IMPLANT
DECANTER SPIKE VIAL GLASS SM (MISCELLANEOUS) IMPLANT
DRAPE C-ARM 42X72 X-RAY (DRAPES) ×4 IMPLANT
DRAPE C-ARMOR (DRAPES) ×8 IMPLANT
DRAPE EXTREMITY T 121X128X90 (DRAPE) ×4 IMPLANT
DRAPE IMP U-DRAPE 54X76 (DRAPES) ×4 IMPLANT
DRAPE SURG 17X23 STRL (DRAPES) ×8 IMPLANT
DRILL 2.6X122MM WL AO SHAFT (BIT) ×4 IMPLANT
DRSG PAD ABDOMINAL 8X10 ST (GAUZE/BANDAGES/DRESSINGS) ×8 IMPLANT
DURAPREP 26ML APPLICATOR (WOUND CARE) ×8 IMPLANT
ELECT REM PT RETURN 9FT ADLT (ELECTROSURGICAL) ×4
ELECTRODE REM PT RTRN 9FT ADLT (ELECTROSURGICAL) ×2 IMPLANT
GAUZE SPONGE 4X4 12PLY STRL (GAUZE/BANDAGES/DRESSINGS) ×4 IMPLANT
GAUZE XEROFORM 1X8 LF (GAUZE/BANDAGES/DRESSINGS) ×4 IMPLANT
GLOVE BIO SURGEON STRL SZ 6.5 (GLOVE) ×3 IMPLANT
GLOVE BIO SURGEON STRL SZ7.5 (GLOVE) ×4 IMPLANT
GLOVE BIO SURGEONS STRL SZ 6.5 (GLOVE) ×1
GLOVE BIOGEL PI IND STRL 7.0 (GLOVE) ×2 IMPLANT
GLOVE BIOGEL PI IND STRL 8 (GLOVE) ×2 IMPLANT
GLOVE BIOGEL PI INDICATOR 7.0 (GLOVE) ×2
GLOVE BIOGEL PI INDICATOR 8 (GLOVE) ×2
GLOVE SKINSENSE NS SZ7.5 (GLOVE) ×2
GLOVE SKINSENSE STRL SZ7.5 (GLOVE) ×2 IMPLANT
GLOVE SURG SYN 7.5  E (GLOVE) ×2
GLOVE SURG SYN 7.5 E (GLOVE) ×2 IMPLANT
GOWN STRL REIN XL XLG (GOWN DISPOSABLE) ×4 IMPLANT
GOWN STRL REUS W/ TWL LRG LVL3 (GOWN DISPOSABLE) ×2 IMPLANT
GOWN STRL REUS W/TWL LRG LVL3 (GOWN DISPOSABLE) ×4
K-WIRE .035X4 (WIRE) ×8 IMPLANT
K-WIRE ORTHOPEDIC 1.4X150L (WIRE) ×20
KWIRE ORTHOPEDIC 1.4X150L (WIRE) ×10 IMPLANT
NEEDLE HYPO 22GX1.5 SAFETY (NEEDLE) IMPLANT
NS IRRIG 1000ML POUR BTL (IV SOLUTION) ×4 IMPLANT
PACK BASIN DAY SURGERY FS (CUSTOM PROCEDURE TRAY) ×4 IMPLANT
PAD CAST 3X4 CTTN HI CHSV (CAST SUPPLIES) IMPLANT
PAD CAST 4YDX4 CTTN HI CHSV (CAST SUPPLIES) IMPLANT
PADDING CAST COTTON 3X4 STRL (CAST SUPPLIES)
PADDING CAST COTTON 4X4 STRL (CAST SUPPLIES)
PADDING CAST COTTON 6X4 STRL (CAST SUPPLIES) IMPLANT
PADDING CAST SYN 6 (CAST SUPPLIES) ×2
PADDING CAST SYNTHETIC 4 (CAST SUPPLIES) ×2
PADDING CAST SYNTHETIC 4X4 STR (CAST SUPPLIES) ×2 IMPLANT
PADDING CAST SYNTHETIC 6X4 NS (CAST SUPPLIES) ×2 IMPLANT
PENCIL BUTTON HOLSTER BLD 10FT (ELECTRODE) ×4 IMPLANT
PLATE L.LAP STEP 1 (Plate) ×3 IMPLANT
PLATE STR 6HOLE (Plate) ×4 IMPLANT
SCREW ASNIS COUNTERSINK (BIT) ×4 IMPLANT
SCREW BN STD T8 14X3.5XTXHD (Screw) ×2 IMPLANT
SCREW BONE 14MMX3.5MM (Screw) ×4 IMPLANT
SCREW BONE 18 (Screw) ×4 IMPLANT
SCREW BONE 3.5X14MM (Screw) ×3 IMPLANT
SCREW BONE 3.5X20MM (Screw) ×4 IMPLANT
SCREW BONE 3.5X26MM (Screw) ×8 IMPLANT
SCREW BONE NON-LCKING 3.5X12MM (Screw) ×8 IMPLANT
SCREW CANNULATED 28MMX4.0MM (Screw) ×4 IMPLANT
SCREW LOCK 3.5X20MM (Screw) ×4 IMPLANT
SCREW LOCKING 3.5X12 (Screw) ×4 IMPLANT
SCREW LOCKING 3.5X16MM (Screw) ×8 IMPLANT
SCREW LOCKING 3.5X22MAX (Screw) ×12 IMPLANT
SCREW NON LOCK 3.5X16MM (Screw) ×4 IMPLANT
SHEET MEDIUM DRAPE 40X70 STRL (DRAPES) ×4 IMPLANT
SLEEVE SCD COMPRESS KNEE MED (MISCELLANEOUS) ×4 IMPLANT
SPLINT FIBERGLASS 4X30 (CAST SUPPLIES) ×8 IMPLANT
SPONGE LAP 18X18 X RAY DECT (DISPOSABLE) ×4 IMPLANT
SUCTION FRAZIER HANDLE 10FR (MISCELLANEOUS) ×2
SUCTION TUBE FRAZIER 10FR DISP (MISCELLANEOUS) ×2 IMPLANT
SUT ETHILON 3 0 PS 1 (SUTURE) IMPLANT
SUT VIC AB 0 CT1 27 (SUTURE)
SUT VIC AB 0 CT1 27XBRD ANBCTR (SUTURE) IMPLANT
SUT VIC AB 2-0 CT1 27 (SUTURE) ×4
SUT VIC AB 2-0 CT1 TAPERPNT 27 (SUTURE) ×2 IMPLANT
SUT VIC AB 3-0 SH 27 (SUTURE)
SUT VIC AB 3-0 SH 27X BRD (SUTURE) IMPLANT
SYR BULB 3OZ (MISCELLANEOUS) ×4 IMPLANT
SYR CONTROL 10ML LL (SYRINGE) IMPLANT
TOWEL OR 17X24 6PK STRL BLUE (TOWEL DISPOSABLE) ×4 IMPLANT
TOWEL OR NON WOVEN STRL DISP B (DISPOSABLE) ×4 IMPLANT
TRAY DSU PREP LF (CUSTOM PROCEDURE TRAY) ×4 IMPLANT
TUBE CONNECTING 20'X1/4 (TUBING) ×1
TUBE CONNECTING 20X1/4 (TUBING) ×3 IMPLANT
UNDERPAD 30X30 (UNDERPADS AND DIAPERS) ×4 IMPLANT
YANKAUER SUCT BULB TIP NO VENT (SUCTIONS) ×4 IMPLANT

## 2016-07-25 NOTE — Anesthesia Preprocedure Evaluation (Addendum)
Anesthesia Evaluation  Patient identified by MRN, date of birth, ID band Patient awake    Reviewed: Allergy & Precautions, H&P , NPO status , Patient's Chart, lab work & pertinent test results  Airway Mallampati: II  TM Distance: >3 FB Neck ROM: Full    Dental no notable dental hx. (+) Upper Dentures, Edentulous Lower, Dental Advisory Given   Pulmonary neg pulmonary ROS, former smoker,    Pulmonary exam normal breath sounds clear to auscultation       Cardiovascular hypertension, Pt. on medications  Rhythm:Regular Rate:Normal     Neuro/Psych negative neurological ROS  negative psych ROS   GI/Hepatic negative GI ROS, Neg liver ROS,   Endo/Other  negative endocrine ROS  Renal/GU negative Renal ROS  negative genitourinary   Musculoskeletal   Abdominal   Peds  Hematology negative hematology ROS (+)   Anesthesia Other Findings   Reproductive/Obstetrics negative OB ROS                            Anesthesia Physical Anesthesia Plan  ASA: II  Anesthesia Plan: General and Regional   Post-op Pain Management: GA combined w/ Regional for post-op pain   Induction: Intravenous  Airway Management Planned: LMA and Oral ETT  Additional Equipment:   Intra-op Plan:   Post-operative Plan: Extubation in OR  Informed Consent: I have reviewed the patients History and Physical, chart, labs and discussed the procedure including the risks, benefits and alternatives for the proposed anesthesia with the patient or authorized representative who has indicated his/her understanding and acceptance.   Dental advisory given  Plan Discussed with: CRNA  Anesthesia Plan Comments:        Anesthesia Quick Evaluation

## 2016-07-25 NOTE — Op Note (Signed)
   Date of Surgery: 07/25/2016  INDICATIONS: Teresa Collier is a 65 y.o.-year-old female with a left first metatarsal fracture and the bimalleolar ankle fracture;  The patient did consent to the procedure after discussion of the risks and benefits.  PREOPERATIVE DIAGNOSIS: 1. Left displaced first metatarsal fracture with disruption of first TMT joint 2. Left bimalleolar ankle fracture  POSTOPERATIVE DIAGNOSIS: Same.  PROCEDURE:  1. Open reduction internal fixation of left bimalleolar ankle fracture 2. Open reduction internal fixation of first metatarsal fracture including TMT joint  SURGEON: N. Eduard Roux, M.D.  ASSIST: None.  ANESTHESIA:  general, regional  IV FLUIDS AND URINE: See anesthesia.  ESTIMATED BLOOD LOSS: Minimal mL.  IMPLANTS: [employer]  DRAINS: None  COMPLICATIONS: None.  DESCRIPTION OF PROCEDURE: The patient was brought to the operating room and placed supine on the operating table.  The patient had been signed prior to the procedure and this was documented. The patient had the anesthesia placed by the anesthesiologist.  A time-out was performed to confirm that this was the correct patient, site, side and location. The patient did receive antibiotics prior to the incision and was re-dosed during the procedure as needed at indicated intervals.  A tourniquet was placed.  The patient had the operative extremity prepped and draped in the standard surgical fashion.    We first began with the Lisfranc injury. A dorsal incision over the first metatarsal was created. Dissection was carried down to the first TMT joint. Branches of the deep peroneal nerve were identified and mobilized. Some branches had to be sacrificed in order to gain exposure. Subperiosteal elevation was then performed and the fracture was exposed. There was widening of the Lisfranc complex from the base of the first metatarsal to the second cuneiform. Organized hematoma was removed. Immature callus was also  removed. The fracture was then reduced and held provisionally using K wires. With the first TMT joint reduced I then placed a medial plate spanning the cuneiform and base of the first metatarsal. This was confirmed under fluoroscopy. We then placed a series of nonlocking and locking screws in order to reconstruct the first TMT joint. I then placed a independent compression screw from the base of the first metatarsal into the first cuneiform in order to gain compression across the TMT joint.  I then turned my attention to the ankle fracture. A lateral incision over the distal fibula was created. Dissection was carried down to the bone. Subperiosteal elevation was performed. Organized hematoma and immature callus was removed. The fracture was then reduced and clamped. A posterior to anterior lag screw was placed. The clamp was removed. We then placed a semitubular straight plate on the lateral aspect of the fibula. Teresa Collier bone quality was poor therefore we had to use a few locking screws through the plate into the bone. Final x-rays were then taken. The wounds were thoroughly irrigated. The wounds were closed in layer fashion using using 3-0 Vicryl, 3-0 nylon for the foot and 0 Vicryl, 2-0 Vicryl, 3-0 nylon for the ankle. Sterile dressings were applied. Foot was immobilized in a short leg splint. Patient tolerated the procedure well and no immediate competitions.  POSTOPERATIVE PLAN: Nonweight bearing  N. Eduard Roux, MD [facility] 12:06 PM

## 2016-07-25 NOTE — Anesthesia Procedure Notes (Signed)
Procedure Name: LMA Insertion Date/Time: 07/25/2016 9:38 AM Performed by: Bufford Spikes Pre-anesthesia Checklist: Patient identified, Timeout performed, Emergency Drugs available, Suction available and Patient being monitored Patient Re-evaluated:Patient Re-evaluated prior to inductionOxygen Delivery Method: Circle system utilized Preoxygenation: Pre-oxygenation with 100% oxygen Intubation Type: IV induction Ventilation: Mask ventilation without difficulty LMA: LMA inserted LMA Size: 4.0 Tube type: Oral Number of attempts: 1 Placement Confirmation: positive ETCO2 Tube secured with: Tape Dental Injury: Teeth and Oropharynx as per pre-operative assessment

## 2016-07-25 NOTE — Discharge Instructions (Signed)
° ° °  1. Keep splint clean and dry °2. Elevate foot above level of the heart °3. Take aspirin to prevent blood clots °4. Take pain meds as needed °5. Strict non weight bearing to operative extremity ° ° ° ° °Regional Anesthesia Blocks ° °1. Numbness or the inability to move the "blocked" extremity may last from 3-48 hours after placement. The length of time depends on the medication injected and your individual response to the medication. If the numbness is not going away after 48 hours, call your surgeon. ° °2. The extremity that is blocked will need to be protected until the numbness is gone and the  Strength has returned. Because you cannot feel it, you will need to take extra care to avoid injury. Because it may be weak, you may have difficulty moving it or using it. You may not know what position it is in without looking at it while the block is in effect. ° °3. For blocks in the legs and feet, returning to weight bearing and walking needs to be done carefully. You will need to wait until the numbness is entirely gone and the strength has returned. You should be able to move your leg and foot normally before you try and bear weight or walk. You will need someone to be with you when you first try to ensure you do not fall and possibly risk injury. ° °4. Bruising and tenderness at the needle site are common side effects and will resolve in a few days. ° °5. Persistent numbness or new problems with movement should be communicated to the surgeon or the [facility] ([phone])/ [facility] ([phone]). ° ° ° ° ° °Post Anesthesia Home Care Instructions ° °Activity: °Get plenty of rest for the remainder of the day. A responsible adult should stay with you for 24 hours following the procedure.  °For the next 24 hours, DO NOT: °-Drive a car °-Operate machinery °-Drink alcoholic beverages °-Take any medication unless instructed by your physician °-Make any legal decisions or sign important  papers. ° °Meals: °Start with liquid foods such as gelatin or soup. Progress to regular foods as tolerated. Avoid greasy, spicy, heavy foods. If nausea and/or vomiting occur, drink only clear liquids until the nausea and/or vomiting subsides. Call your physician if vomiting continues. ° °Special Instructions/Symptoms: °Your throat may feel dry or sore from the anesthesia or the breathing tube placed in your throat during surgery. If this causes discomfort, gargle with warm salt water. The discomfort should disappear within 24 hours. ° °If you had a scopolamine patch placed behind your ear for the management of post- operative nausea and/or vomiting: ° °1. The medication in the patch is effective for 72 hours, after which it should be removed.  Wrap patch in a tissue and discard in the trash. Wash hands thoroughly with soap and water. °2. You may remove the patch earlier than 72 hours if you experience unpleasant side effects which may include dry mouth, dizziness or visual disturbances. °3. Avoid touching the patch. Wash your hands with soap and water after contact with the patch. °  ° °

## 2016-07-25 NOTE — Transfer of Care (Signed)
Immediate Anesthesia Transfer of Care Note  Patient: Teresa Collier  Procedure(s) Performed: Procedure(s): OPEN REDUCTION INTERNAL FIXATION (ORIF) LEFT BIMALLEOLAR  ANKLE AND LISFRANC FRACTURES (Left) OPEN REDUCTION INTERNAL FIXATION (ORIF) FOOT LISFRANC FRACTURE (Left)  Patient Location: PACU  Anesthesia Type:GA combined with regional for post-op pain  Level of Consciousness: awake, alert , oriented and patient cooperative  Airway & Oxygen Therapy: Patient Spontanous Breathing and Patient connected to face mask oxygen  Post-op Assessment: Report given to RN and Post -op Vital signs reviewed and stable  Post vital signs: Reviewed and stable  Last Vitals:  Vitals:   07/25/16 0855 07/25/16 0900  BP: 119/71 (!) 107/94  Pulse: 69 74  Resp: 19 19  Temp:      Last Pain:  Vitals:   07/25/16 0749  TempSrc: Oral         Complications: No apparent anesthesia complications

## 2016-07-25 NOTE — Anesthesia Procedure Notes (Addendum)
Anesthesia Regional Block:  Popliteal block  Pre-Anesthetic Checklist: ,, timeout performed, Correct Patient, Correct Site, Correct Laterality, Correct Procedure, Correct Position, site marked, Risks and benefits discussed, pre-op evaluation,  At surgeon's request and post-op pain management  Laterality: Left  Prep: Maximum Sterile Barrier Precautions used, chloraprep       Needles:  Injection technique: Single-shot  Needle Type: Echogenic Stimulator Needle     Needle Length: 9cm 9 cm Needle Gauge: 21 and 21 G    Additional Needles:  Procedures: ultrasound guided (picture in chart) and nerve stimulator Popliteal block  Nerve Stimulator or Paresthesia:  Response: Peroneal,  Response: Tibial,   Additional Responses:   Narrative:  Start time: 07/25/2016 8:43 AM End time: 07/25/2016 8:53 AM Injection made incrementally with aspirations every 5 mL. Anesthesiologist: Roderic Palau  Additional Notes: 2% Lidocaine skin wheel. Saphenous block with 10cc of 0.5% Bupivicaine plain.

## 2016-07-25 NOTE — Progress Notes (Signed)
Assisted Dr. Edmond Fitzgerald with left, ultrasound guided, popliteal/saphenous block. Side rails up, monitors on throughout procedure. See vital signs in flow sheet. Tolerated Procedure well. 

## 2016-07-25 NOTE — H&P (Signed)
    PREOPERATIVE H&P  Chief Complaint: Left 1st metatarsal fracture, left bimalleolar ankle fracture  HPI: Teresa Collier is a 65 y.o. female who presents for surgical treatment of Left 1st metatarsal fracture, left bimalleolar ankle fracture.  She denies any changes in medical history.  Past Medical History:  Diagnosis Date  . Hypertension    Past Surgical History:  Procedure Laterality Date  . BUNIONECTOMY     Social History   Social History  . Marital status: Married    Spouse name: N/A  . Number of children: N/A  . Years of education: N/A   Social History Main Topics  . Smoking status: Former Smoker    Packs/day: 2.00    Years: 20.00    Types: Cigarettes    Quit date: 01/27/2009  . Smokeless tobacco: Never Used  . Alcohol use No  . Drug use: Unknown  . Sexual activity: Not Asked   Other Topics Concern  . None   Social History Narrative  . None   History reviewed. No pertinent family history. No Known Allergies Prior to Admission medications   Medication Sig Start Date End Date Taking? Authorizing Provider  hydrochlorothiazide (HYDRODIURIL) 25 MG tablet Take 25 mg by mouth daily.   Yes Historical Provider, MD  Multiple Vitamin (MULTIVITAMIN WITH MINERALS) TABS tablet Take 1 tablet by mouth daily.   Yes Historical Provider, MD  oxyCODONE-acetaminophen (PERCOCET/ROXICET) 5-325 MG tablet Take 2 tablets by mouth every 4 (four) hours as needed for severe pain. 07/10/16  Yes Jeffrey Hedges, PA-C  pravastatin (PRAVACHOL) 80 MG tablet Take 80 mg by mouth daily.   Yes Historical Provider, MD  verapamil (VERELAN PM) 360 MG 24 hr capsule Take 360 mg by mouth daily.   Yes Historical Provider, MD  aspirin-acetaminophen-caffeine (EXCEDRIN MIGRAINE) [phone] MG tablet Take 1-2 tablets by mouth every 6 (six) hours as needed for headache.    Historical Provider, MD     Positive ROS: All other systems have been reviewed and were otherwise negative with the exception of those  mentioned in the HPI and as above.  Physical Exam: General: Alert, no acute distress Cardiovascular: No pedal edema Respiratory: No cyanosis, no use of accessory musculature GI: abdomen soft Skin: No lesions in the area of chief complaint Neurologic: Sensation intact distally Psychiatric: Patient is competent for consent with normal mood and affect Lymphatic: no lymphedema  MUSCULOSKELETAL: exam stable  Assessment: Left 1st metatarsal fracture, left bimalleolar ankle fracture  Plan: Plan for Procedure(s): OPEN REDUCTION INTERNAL FIXATION (ORIF) LEFT BIMALLEOLAR  ANKLE AND LISFRANC FRACTURES OPEN REDUCTION INTERNAL FIXATION (ORIF) FOOT LISFRANC FRACTURE  The risks benefits and alternatives were discussed with the patient including but not limited to the risks of nonoperative treatment, versus surgical intervention including infection, bleeding, nerve injury,  blood clots, cardiopulmonary complications, morbidity, mortality, among others, and they were willing to proceed.   Marianna Payment, MD   07/25/2016 8:22 AM

## 2016-07-25 NOTE — Anesthesia Postprocedure Evaluation (Signed)
Anesthesia Post Note  Patient: Teresa Collier  Procedure(s) Performed: Procedure(s) (LRB): OPEN REDUCTION INTERNAL FIXATION (ORIF) LEFT BIMALLEOLAR  ANKLE AND LISFRANC FRACTURES (Left) OPEN REDUCTION INTERNAL FIXATION (ORIF) FOOT LISFRANC FRACTURE (Left)  Patient location during evaluation: PACU Anesthesia Type: General and Regional Level of consciousness: awake and alert Pain management: pain level controlled Vital Signs Assessment: post-procedure vital signs reviewed and stable Respiratory status: spontaneous breathing, nonlabored ventilation and respiratory function stable Cardiovascular status: blood pressure returned to baseline and stable Postop Assessment: no signs of nausea or vomiting Anesthetic complications: no    Last Vitals:  Vitals:   07/25/16 1245 07/25/16 1255  BP: 128/68   Pulse: 86 87  Resp: 14 15  Temp:      Last Pain:  Vitals:   07/25/16 1245  TempSrc:   PainSc: 0-No pain        RLE Motor Response: No movement due to regional block (07/25/16 1245) RLE Sensation: No sensation (absent) (07/25/16 1245)      Aarav Burgett,W. EDMOND

## 2016-07-27 ENCOUNTER — Encounter (HOSPITAL_BASED_OUTPATIENT_CLINIC_OR_DEPARTMENT_OTHER): Payer: Self-pay | Admitting: Orthopaedic Surgery

## 2016-07-31 ENCOUNTER — Inpatient Hospital Stay (INDEPENDENT_AMBULATORY_CARE_PROVIDER_SITE_OTHER): Payer: Self-pay | Admitting: Orthopaedic Surgery

## 2016-08-07 ENCOUNTER — Inpatient Hospital Stay (INDEPENDENT_AMBULATORY_CARE_PROVIDER_SITE_OTHER): Payer: Self-pay | Admitting: Orthopaedic Surgery

## 2016-08-07 DIAGNOSIS — S92312D Displaced fracture of first metatarsal bone, left foot, subsequent encounter for fracture with routine healing: Secondary | ICD-10-CM

## 2016-08-07 DIAGNOSIS — S82842D Displaced bimalleolar fracture of left lower leg, subsequent encounter for closed fracture with routine healing: Secondary | ICD-10-CM

## 2016-08-20 ENCOUNTER — Telehealth (INDEPENDENT_AMBULATORY_CARE_PROVIDER_SITE_OTHER): Payer: Self-pay | Admitting: Orthopaedic Surgery

## 2016-08-20 NOTE — Telephone Encounter (Signed)
She should come in.  It's hard to evaluate a new problem over the phone

## 2016-08-20 NOTE — Telephone Encounter (Signed)
Patient is having pain on her bunion and would like a follow call regarding this pain.

## 2016-08-21 NOTE — Telephone Encounter (Signed)
Called pt to offer appt for tomorrow so appt is scheduled.

## 2016-08-23 ENCOUNTER — Ambulatory Visit (INDEPENDENT_AMBULATORY_CARE_PROVIDER_SITE_OTHER): Payer: Self-pay

## 2016-08-23 ENCOUNTER — Encounter (INDEPENDENT_AMBULATORY_CARE_PROVIDER_SITE_OTHER): Payer: Self-pay | Admitting: Orthopaedic Surgery

## 2016-08-23 ENCOUNTER — Ambulatory Visit (INDEPENDENT_AMBULATORY_CARE_PROVIDER_SITE_OTHER): Payer: Self-pay | Admitting: Orthopaedic Surgery

## 2016-08-23 DIAGNOSIS — M25572 Pain in left ankle and joints of left foot: Secondary | ICD-10-CM

## 2016-08-23 DIAGNOSIS — S8262XD Displaced fracture of lateral malleolus of left fibula, subsequent encounter for closed fracture with routine healing: Secondary | ICD-10-CM

## 2016-08-23 NOTE — Progress Notes (Signed)
   Office Visit Note   Patient: Teresa Collier           Date of Birth: [birth year]           MRN: [id number] Visit Date: 08/23/2016              Requested by: No referring provider defined for this encounter. PCP: No PCP Per Patient   Assessment & Plan: Visit Diagnoses:  1. Closed disp fracture of left lateral malleolus with routine healing   2. Pain in left ankle and joints of left foot     Plan:  - begin WBAT in CAM walker with PT - progressing well - f/u 6 weeks - repeat 3 view xrays of foot and ankle  Follow-Up Instructions: Return in about 6 weeks (around 10/04/2016) for recheck foot and ankle.   Orders:  Orders Placed This Encounter  Procedures  . XR Foot Complete Left  . XR Ankle Complete Left  . Ambulatory referral to Physical Therapy   No orders of the defined types were placed in this encounter.     Procedures: No procedures performed   Clinical Data: No additional findings.   Subjective: Chief Complaint  Patient presents with  . Left Ankle - Routine Post Op, Follow-up    4 WEEKS PO.    4 week follow up for ORIF lisfranc and lateral malleolus.  She's been weight bearing to the heel.  Mainly c/o pain in the bunion area.  Taking ibuprofen and oxy.  Denies any f/c.      Review of Systems   Objective: Vital Signs: There were no vitals taken for this visit.  Physical Exam  Ortho Exam - healed surgical scars - moderate swelling of the foot - no signs of infection  Specialty Comments:  No specialty comments available.  Imaging: No results found.   PMFS History: Patient Active Problem List   Diagnosis Date Noted  . Closed disp fracture of left lateral malleolus with routine healing 08/23/2016   Past Medical History:  Diagnosis Date  . Hypertension     History reviewed. No pertinent family history.  Past Surgical History:  Procedure Laterality Date  . BUNIONECTOMY    . OPEN REDUCTION INTERNAL FIXATION (ORIF) FOOT LISFRANC  FRACTURE Left 07/25/2016   Procedure: OPEN REDUCTION INTERNAL FIXATION (ORIF) FOOT LISFRANC FRACTURE;  Surgeon: Leandrew Koyanagi, MD;  Location: [facility];  Service: Orthopedics;  Laterality: Left;  . ORIF ANKLE FRACTURE Left 07/25/2016   Procedure: OPEN REDUCTION INTERNAL FIXATION (ORIF) LEFT BIMALLEOLAR  ANKLE AND LISFRANC FRACTURES;  Surgeon: Leandrew Koyanagi, MD;  Location: [facility];  Service: Orthopedics;  Laterality: Left;   Social History   Occupational History  . Not on file.   Social History Main Topics  . Smoking status: Former Smoker    Packs/day: 2.00    Years: 20.00    Types: Cigarettes    Quit date: 01/27/2009  . Smokeless tobacco: Never Used  . Alcohol use No  . Drug use: Unknown  . Sexual activity: Not on file

## 2016-08-24 ENCOUNTER — Telehealth (INDEPENDENT_AMBULATORY_CARE_PROVIDER_SITE_OTHER): Payer: Self-pay | Admitting: Orthopaedic Surgery

## 2016-08-24 MED ORDER — HYDROCODONE-ACETAMINOPHEN 5-325 MG PO TABS: 1.0000 | ORAL_TABLET | Freq: Two times a day (BID) | ORAL | 0 refills | Status: AC | PRN

## 2016-08-24 NOTE — Telephone Encounter (Signed)
Please advise 

## 2016-08-24 NOTE — Telephone Encounter (Signed)
Refill norco 1 tab po bid prn. #30

## 2016-08-24 NOTE — Telephone Encounter (Signed)
Patient requesting a RX refill on pain medication.  Contact Info: [phone]

## 2016-08-24 NOTE — Telephone Encounter (Signed)
RX PRINTED AND IS READY FOR PICK UP AT THE FRONT DESK, CALLED Pt. PT AWARE

## 2016-08-28 ENCOUNTER — Ambulatory Visit: Payer: Self-pay | Attending: Orthopaedic Surgery

## 2016-08-28 DIAGNOSIS — M25672 Stiffness of left ankle, not elsewhere classified: Secondary | ICD-10-CM

## 2016-08-28 DIAGNOSIS — X58XXXA Exposure to other specified factors, initial encounter: Secondary | ICD-10-CM | POA: Insufficient documentation

## 2016-08-28 DIAGNOSIS — R600 Localized edema: Secondary | ICD-10-CM

## 2016-08-28 DIAGNOSIS — R262 Difficulty in walking, not elsewhere classified: Secondary | ICD-10-CM

## 2016-08-28 DIAGNOSIS — S82842A Displaced bimalleolar fracture of left lower leg, initial encounter for closed fracture: Secondary | ICD-10-CM

## 2016-08-28 DIAGNOSIS — M6281 Muscle weakness (generalized): Secondary | ICD-10-CM

## 2016-08-28 NOTE — Therapy (Signed)
[place], [place], [address] Phone: [phone]   Fax:  [phone]  Physical Therapy Evaluation  Patient Details  Name: Teresa Collier MRN: [id number] Date of Birth: 11-Jul-1951 Referring Provider: Frankey Shown MD  Encounter Date: 08/28/2016      PT End of Session - 08/28/16 1159    Visit Number 1   Number of Visits 16   Date for PT Re-Evaluation 10/26/16   PT Start Time 1100   PT Stop Time 1153   PT Time Calculation (min) 53 min   Activity Tolerance Patient tolerated treatment well;No increased pain   Behavior During Therapy WFL for tasks assessed/performed      Past Medical History:  Diagnosis Date  . Hypertension     Past Surgical History:  Procedure Laterality Date  . BUNIONECTOMY    . OPEN REDUCTION INTERNAL FIXATION (ORIF) FOOT LISFRANC FRACTURE Left 07/25/2016   Procedure: OPEN REDUCTION INTERNAL FIXATION (ORIF) FOOT LISFRANC FRACTURE;  Surgeon: Leandrew Koyanagi, MD;  Location: [facility];  Service: Orthopedics;  Laterality: Left;  . ORIF ANKLE FRACTURE Left 07/25/2016   Procedure: OPEN REDUCTION INTERNAL FIXATION (ORIF) LEFT BIMALLEOLAR  ANKLE AND LISFRANC FRACTURES;  Surgeon: Leandrew Koyanagi, MD;  Location: [facility];  Service: Orthopedics;  Laterality: Left;    There were no vitals filed for this visit.       Subjective Assessment - 08/28/16 1104    Subjective [name] reports foot and ankle fracture when she stepped over gas hose and  caught foot.  07/09/2016   Limitations Walking;House hold activities   How long can you sit comfortably? As needed   How long can you stand comfortably? 30 min more weight to RT    How long can you walk comfortably? 200 feet   Diagnostic tests NA   Patient Stated Goals To get boot off and walk regular   Currently in Pain? Yes   Pain Score 2    Pain Location Ankle   Pain Orientation Left   Pain Descriptors / Indicators  --  aggravating   Pain Type Chronic pain   Pain Onset More than a month ago   Pain Frequency Constant   Aggravating Factors  Not sure   Pain Relieving Factors Resting , medication   Multiple Pain Sites No            [facility] PT Assessment - 08/28/16 0001      Assessment   Medical Diagnosis ORIF LT ankle bimalleolar  fracture   Referring Provider Frankey Shown MD   Onset Date/Surgical Date 07/09/16  surgery date 07/25/16   Next MD Visit 10/04/2016   Prior Therapy No     Precautions   Precautions None     Restrictions   Weight Bearing Restrictions --  WBAT     Balance Screen   Has the patient fallen in the past 6 months Yes   How many times? 1  with injury   Has the patient had a decrease in activity level because of a fear of falling?  Yes   Is the patient reluctant to leave their home because of a fear of falling?  No     Home Environment   Living Environment Private residence   Living Arrangements Alone   Type of Teresa Collier to enter   Entrance Stairs-Number of Steps 1   [place] One level     Prior Function  Level of Independence Needs assistance with homemaking  cleaning at home she is not able to do   Vocation Unemployed     Cognition   Overall Cognitive Status Within Functional Limits for tasks assessed     Observation/Other Assessments-Edema    Edema Figure 8     Figure 8 Edema   Figure 8 - Right  50 cm   Figure 8 - Left  51.5 cm     ROM / Strength   AROM / PROM / Strength AROM;PROM;Strength     AROM   AROM Assessment Site Ankle   Right/Left Ankle Right;Left   Right Ankle Dorsiflexion 102   Right Ankle Plantar Flexion 52   Right Ankle Inversion 18   Right Ankle Eversion 12   Left Ankle Dorsiflexion 88   Left Ankle Plantar Flexion 20   Left Ankle Inversion 8   Left Ankle Eversion 0     PROM   PROM Assessment Site Ankle   Right/Left Ankle Left   Left Ankle Dorsiflexion 98   Left Ankle Plantar Flexion 22   Left Ankle  Inversion 24   Left Ankle Eversion 6     Strength   Strength Assessment Site Ankle   Right/Left Ankle Left   Left Ankle Dorsiflexion 5/5   Left Ankle Plantar Flexion 3+/5   Left Ankle Inversion 4+/5   Left Ankle Eversion 4/5     Palpation   Palpation comment decreased joint mobility Lt metatarsals and all joints of LT foot and ankle  compared to RT.     Walking decr weight to LT LE with single crutch                       PT Education - 08/28/16 1158    Education provided Yes   Education Details POC, AROM amd DF stretch   Person(s) Educated Patient   Methods Explanation;Demonstration;Tactile cues;Verbal cues;Handout   Comprehension Returned demonstration;Verbalized understanding          PT Short Term Goals - 08/28/16 1203      PT SHORT TERM GOAL #1   Title Independent with inital HEP   Time 3   Period Weeks   Status New     PT SHORT TERM GOAL #2   Title She will walk full time without assitive device in and out of home   Time 4   Period Weeks   Status New     PT SHORT TERM GOAL #3   Title Pain level will remain mild 1-3/10   Time 4   Period Weeks   Status New     PT SHORT TERM GOAL #4   Title DF wil incr to 102 degrees LT to facilitae walking out of  CAM walker    Time 4   Period Weeks   Status New           PT Long Term Goals - 08/28/16 1205      PT LONG TERM GOAL #1   Title She will be independent with all HEP issued as of last visit   Time 8   Period Weeks   Status New     PT LONG TERM GOAL #2   Title She will have intermittant mild pain after walking  to do normal shopping tasks   Time 8   Period Weeks   Status New     PT LONG TERM GOAL #3   Title She will be able to walk steps step  over step with one rail safely   Time 8   Period Weeks   Status New     PT LONG TERM GOAL #4   Title she will be able to stand single leg for at least 10 seconds to improve safety with walking   Time 8   Period Weeks   Status New      PT LONG TERM GOAL #5   Title She will return to normal home tasks with 1-2/10 pain   Time 8   Period Weeks   Status New               Plan - 08/28/16 1159    Clinical Impression Statement [name] presents for low complexity eval post LT bimalleolar ankle fracture with ORIF and metasrsal fracture with ORIF.   She has decreased ROM , weakness , swelling and pain leading to decreased weight bearing and difficulty wlaking and doing normal activity.   Rehab Potential Good   PT Frequency 2x / week   PT Duration 8 weeks   PT Treatment/Interventions Cryotherapy;Electrical Stimulation;Moist Heat;Vasopneumatic Device;Taping;Manual techniques;Passive range of motion;Patient/family education;Balance training;Therapeutic exercise;Gait training;Stair training   PT Next Visit Plan Add band exercises for home , manual , modalities,    PT Home Exercise Plan AROM exer and DF stretching   Consulted and Agree with Plan of Care Patient      Patient will benefit from skilled therapeutic intervention in order to improve the following deficits and impairments:  Pain, Increased edema, Decreased strength, Decreased activity tolerance, Difficulty walking, Decreased range of motion  Visit Diagnosis: Closed bimalleolar fracture of left ankle, initial encounter - Plan: PT plan of care cert/re-cert  Stiffness of left ankle, not elsewhere classified - Plan: PT plan of care cert/re-cert  Localized edema - Plan: PT plan of care cert/re-cert  Muscle weakness (generalized) - Plan: PT plan of care cert/re-cert  Difficulty in walking, not elsewhere classified - Plan: PT plan of care cert/re-cert     Problem List Patient Active Problem List   Diagnosis Date Noted  . Closed disp fracture of left lateral malleolus with routine healing 08/23/2016    Darrel Hoover  PT 08/28/2016, 12:15 PM  [facility] [address] [place], [place],  [address] Phone: [phone]   Fax:  [phone]  Name: Macyn Rutz MRN: [id number] Date of Birth: May 07, 1951

## 2016-08-28 NOTE — Patient Instructions (Signed)
From cabinet issued Active 4 way ROM LT ankle 3x/day 3-10 reps 3-5 sec hold and also strap DF stretch

## 2016-09-03 ENCOUNTER — Ambulatory Visit: Payer: Self-pay | Attending: Orthopaedic Surgery

## 2016-09-03 DIAGNOSIS — M25672 Stiffness of left ankle, not elsewhere classified: Secondary | ICD-10-CM | POA: Insufficient documentation

## 2016-09-03 DIAGNOSIS — R600 Localized edema: Secondary | ICD-10-CM | POA: Insufficient documentation

## 2016-09-03 DIAGNOSIS — R262 Difficulty in walking, not elsewhere classified: Secondary | ICD-10-CM | POA: Insufficient documentation

## 2016-09-03 DIAGNOSIS — X58XXXA Exposure to other specified factors, initial encounter: Secondary | ICD-10-CM | POA: Insufficient documentation

## 2016-09-03 DIAGNOSIS — M6281 Muscle weakness (generalized): Secondary | ICD-10-CM | POA: Insufficient documentation

## 2016-09-03 DIAGNOSIS — S82842A Displaced bimalleolar fracture of left lower leg, initial encounter for closed fracture: Secondary | ICD-10-CM | POA: Insufficient documentation

## 2016-09-03 NOTE — Therapy (Signed)
[place], [place], [address] Phone: [phone]   Fax:  [phone]  Physical Therapy Treatment  Patient Details  Name: Teresa Collier MRN: [id number] Date of Birth: [birth year] Referring Provider: Frankey Shown MD  Encounter Date: 09/03/2016      PT End of Session - 09/03/16 1017    Visit Number 2   Number of Visits 16   Date for PT Re-Evaluation 10/26/16   PT Start Time [id number]   PT Stop Time 1103   PT Time Calculation (min) 48 min   Activity Tolerance Patient tolerated treatment well;No increased pain   Behavior During Therapy WFL for tasks assessed/performed      Past Medical History:  Diagnosis Date  . Hypertension     Past Surgical History:  Procedure Laterality Date  . BUNIONECTOMY    . OPEN REDUCTION INTERNAL FIXATION (ORIF) FOOT LISFRANC FRACTURE Left 07/25/2016   Procedure: OPEN REDUCTION INTERNAL FIXATION (ORIF) FOOT LISFRANC FRACTURE;  Surgeon: Leandrew Koyanagi, MD;  Location: [facility];  Service: Orthopedics;  Laterality: Left;  . ORIF ANKLE FRACTURE Left 07/25/2016   Procedure: OPEN REDUCTION INTERNAL FIXATION (ORIF) LEFT BIMALLEOLAR  ANKLE AND LISFRANC FRACTURES;  Surgeon: Leandrew Koyanagi, MD;  Location: [facility];  Service: Orthopedics;  Laterality: Left;    There were no vitals filed for this visit.          [facility] PT Assessment - 09/03/16 0001      AROM   Left Ankle Dorsiflexion 93  post stretching   Left Ankle Plantar Flexion 30  post stretch                     [facility] Adult PT Treatment/Exercise - 09/03/16 0001      Exercises   Exercises --     Modalities   Modalities Vasopneumatic     Vasopneumatic   Number Minutes Vasopneumatic  15 minutes   Vasopnuematic Location  Ankle   Vasopneumatic Pressure Low   Vasopneumatic Temperature  35 degrees     Manual Therapy   Manual Therapy Joint mobilization;Passive ROM;Manual Traction    Joint Mobilization AP and PA glides Gr 2-3    Passive ROM All 4 directions  20-30 sec   Manual Traction ankle joint with AP and PA glides and inversion/eversion  ROm     Ankle Exercises: Supine   Isometrics 5 sec x 10 4 way and gentle active assist for ROM      Also active ROM all directions and reviewed HEP and she was able to do this correctly           PT Education - 09/03/16 1050    Education provided Yes   Education Details disccussed placement of ice at home and to be sure her ankle has cold along with foot   Person(s) Educated Patient   Methods Explanation   Comprehension Verbalized understanding          PT Short Term Goals - 08/28/16 1203      PT SHORT TERM GOAL #1   Title Independent with inital HEP   Time 3   Period Weeks   Status New     PT SHORT TERM GOAL #2   Title She will walk full time without assitive device in and out of home   Time 4   Period Weeks   Status New     PT SHORT TERM GOAL #3   Title Pain  level will remain mild 1-3/10   Time 4   Period Weeks   Status New     PT SHORT TERM GOAL #4   Title DF wil incr to 102 degrees LT to facilitae walking out of  CAM walker    Time 4   Period Weeks   Status New           PT Long Term Goals - 08/28/16 1205      PT LONG TERM GOAL #1   Title She will be independent with all HEP issued as of last visit   Time 8   Period Weeks   Status New     PT LONG TERM GOAL #2   Title She will have intermittant mild pain after walking  to do normal shopping tasks   Time 8   Period Weeks   Status New     PT LONG TERM GOAL #3   Title She will be able to walk steps step over step with one rail safely   Time 8   Period Weeks   Status New     PT LONG TERM GOAL #4   Title she will be able to stand single leg for at least 10 seconds to improve safety with walking   Time 8   Period Weeks   Status New     PT LONG TERM GOAL #5   Title She will return to normal home tasks with 1-2/10 pain    Time 8   Period Weeks   Status New               Plan - 09/03/16 1019    Clinical Impression Statement Second visit . No significant changes except incr DF and PF ROM post manual. Pain managable    PT Treatment/Interventions Cryotherapy;Electrical Stimulation;Moist Heat;Vasopneumatic Device;Taping;Manual techniques;Passive range of motion;Patient/family education;Balance training;Therapeutic exercise;Gait training;Stair training   PT Next Visit Plan Add band exercises for home , manual , modalities,    PT Home Exercise Plan AROM exer and DF stretching   Consulted and Agree with Plan of Care Patient      Patient will benefit from skilled therapeutic intervention in order to improve the following deficits and impairments:  Pain, Increased edema, Decreased strength, Decreased activity tolerance, Difficulty walking, Decreased range of motion  Visit Diagnosis: Closed bimalleolar fracture of left ankle, initial encounter  Stiffness of left ankle, not elsewhere classified  Localized edema  Muscle weakness (generalized)  Difficulty in walking, not elsewhere classified     Problem List Patient Active Problem List   Diagnosis Date Noted  . Closed disp fracture of left lateral malleolus with routine healing 08/23/2016    Darrel Hoover  PT 09/03/2016, 10:57 AM  [facility] [address] [place], [place], [address] Phone: [phone]   Fax:  [phone]  Name: Teresa Collier MRN: [id number] Date of Birth: 08/23/1951

## 2016-09-04 ENCOUNTER — Ambulatory Visit (INDEPENDENT_AMBULATORY_CARE_PROVIDER_SITE_OTHER): Payer: Self-pay | Admitting: Orthopaedic Surgery

## 2016-09-05 ENCOUNTER — Ambulatory Visit: Payer: Self-pay

## 2016-09-05 DIAGNOSIS — S82842A Displaced bimalleolar fracture of left lower leg, initial encounter for closed fracture: Secondary | ICD-10-CM

## 2016-09-05 DIAGNOSIS — M25672 Stiffness of left ankle, not elsewhere classified: Secondary | ICD-10-CM

## 2016-09-05 DIAGNOSIS — M6281 Muscle weakness (generalized): Secondary | ICD-10-CM

## 2016-09-05 DIAGNOSIS — R262 Difficulty in walking, not elsewhere classified: Secondary | ICD-10-CM

## 2016-09-05 DIAGNOSIS — R600 Localized edema: Secondary | ICD-10-CM

## 2016-09-05 NOTE — Patient Instructions (Signed)
.  ankle red band exercises x 10-20 reps , 2x/day  4 way

## 2016-09-05 NOTE — Therapy (Signed)
[place], [place], [address] Phone: [phone]   Fax:  [phone]  Physical Therapy Treatment  Patient Details  Name: Teresa Collier MRN: [id number] Date of Birth: [birth year] Referring Provider: Frankey Shown MD  Encounter Date: 09/05/2016      PT End of Session - 09/05/16 0758    Visit Number 3   Number of Visits 16   Date for PT Re-Evaluation 10/26/16   PT Start Time 0750   PT Stop Time 0850   PT Time Calculation (min) 60 min   Activity Tolerance Patient tolerated treatment well   Behavior During Therapy [facility] for tasks assessed/performed      Past Medical History:  Diagnosis Date  . Hypertension     Past Surgical History:  Procedure Laterality Date  . BUNIONECTOMY    . OPEN REDUCTION INTERNAL FIXATION (ORIF) FOOT LISFRANC FRACTURE Left 07/25/2016   Procedure: OPEN REDUCTION INTERNAL FIXATION (ORIF) FOOT LISFRANC FRACTURE;  Surgeon: Leandrew Koyanagi, MD;  Location: [place];  Service: Orthopedics;  Laterality: Left;  . ORIF ANKLE FRACTURE Left 07/25/2016   Procedure: OPEN REDUCTION INTERNAL FIXATION (ORIF) LEFT BIMALLEOLAR  ANKLE AND LISFRANC FRACTURES;  Surgeon: Leandrew Koyanagi, MD;  Location: [facility];  Service: Orthopedics;  Laterality: Left;    There were no vitals filed for this visit.                       [facility] Adult PT Treatment/Exercise - 09/05/16 0802      Vasopneumatic   Number Minutes Vasopneumatic  15 minutes   Vasopnuematic Location  Ankle   Vasopneumatic Pressure Low   Vasopneumatic Temperature  35 degrees     Manual Therapy   Manual therapy comments STW to ant tib   Joint Mobilization AP and PA glides Gr 2-3    Passive ROM All 4 direstions  20-30 sec   Manual Traction ankle joint with AP and PA glides and inversion/eversion  ROm     Ankle Exercises: Aerobic   Stationary Bike Nustep L3 5 min LE only     Ankle Exercises:  Seated   Other Seated Ankle Exercises sit fit ROM all directions then active inversion and eversion x 33 reps with PT asssit to control knee motion to isolate ankle movement.    Other Seated Ankle Exercises 4 way red band x15 issued for home AROm post stretching x 10 x3 sets                PT Education - 09/05/16 0840    Education provided Yes   Education Details band exercises   Person(s) Educated Patient   Methods Explanation;Tactile cues;Verbal cues;Handout   Comprehension Returned demonstration;Verbalized understanding          PT Short Term Goals - 08/28/16 1203      PT SHORT TERM GOAL #1   Title Independent with inital HEP   Time 3   Period Weeks   Status New     PT SHORT TERM GOAL #2   Title She will walk full time without assitive device in and out of home   Time 4   Period Weeks   Status New     PT SHORT TERM GOAL #3   Title Pain level will remain mild 1-3/10   Time 4   Period Weeks   Status New     PT SHORT TERM GOAL #4   Title DF wil  incr to 102 degrees LT to facilitae walking out of  CAM walker    Time 4   Period Weeks   Status New           PT Long Term Goals - 08/28/16 1205      PT LONG TERM GOAL #1   Title She will be independent with all HEP issued as of last visit   Time 8   Period Weeks   Status New     PT LONG TERM GOAL #2   Title She will have intermittant mild pain after walking  to do normal shopping tasks   Time 8   Period Weeks   Status New     PT LONG TERM GOAL #3   Title She will be able to walk steps step over step with one rail safely   Time 8   Period Weeks   Status New     PT LONG TERM GOAL #4   Title she will be able to stand single leg for at least 10 seconds to improve safety with walking   Time 8   Period Weeks   Status New     PT LONG TERM GOAL #5   Title She will return to normal home tasks with 1-2/10 pain   Time 8   Period Weeks   Status New               Plan - 09/05/16 0758     Clinical Impression Statement No inicreased pain . Range appears improved activity . Tolerated band exercises so issued for home .    PT Treatment/Interventions Cryotherapy;Electrical Stimulation;Moist Heat;Vasopneumatic Device;Taping;Manual techniques;Passive range of motion;Patient/family education;Balance training;Therapeutic exercise;Gait training;Stair training   PT Next Visit Plan  band exercises for home review , manual , modalities, closed chaing /weight bearing exercise if not too painful   PT Home Exercise Plan AROM exer and DF stretching, Red band ankle strength 4 way   Consulted and Agree with Plan of Care Patient      Patient will benefit from skilled therapeutic intervention in order to improve the following deficits and impairments:  Pain, Increased edema, Decreased strength, Decreased activity tolerance, Difficulty walking, Decreased range of motion  Visit Diagnosis: Closed bimalleolar fracture of left ankle, initial encounter  Stiffness of left ankle, not elsewhere classified  Localized edema  Muscle weakness (generalized)  Difficulty in walking, not elsewhere classified     Problem List Patient Active Problem List   Diagnosis Date Noted  . Closed disp fracture of left lateral malleolus with routine healing 08/23/2016    Darrel Hoover PT 09/05/2016, 8:43 AM  [facility] [address] [place], [place], [address] Phone: [phone]   Fax:  [phone]  Name: Teresa Collier MRN: [id number] Date of Birth: 05-Oct-1951

## 2016-09-07 ENCOUNTER — Telehealth (INDEPENDENT_AMBULATORY_CARE_PROVIDER_SITE_OTHER): Payer: Self-pay | Admitting: *Deleted

## 2016-09-07 NOTE — Telephone Encounter (Signed)
Pt. Called stating she is having trouble with rehab, she doesn't think he knows about the plate in her leg because she is in a lot of pain after? Also pt is requesting a refill on hydrocodone. Pt call back number is [phone]

## 2016-09-07 NOTE — Telephone Encounter (Signed)
PT. Called back about message below

## 2016-09-07 NOTE — Telephone Encounter (Signed)
Pt. Called stating she was having problems at therapy and wanted to talk to someone about this, also pt is requesting refill of hydrocodone.

## 2016-09-07 NOTE — Telephone Encounter (Signed)
Pt. Called back about this RX

## 2016-09-07 NOTE — Telephone Encounter (Signed)
ERROR

## 2016-09-10 ENCOUNTER — Telehealth (INDEPENDENT_AMBULATORY_CARE_PROVIDER_SITE_OTHER): Payer: Self-pay | Admitting: Orthopaedic Surgery

## 2016-09-10 ENCOUNTER — Encounter (HOSPITAL_COMMUNITY): Payer: Self-pay | Admitting: Emergency Medicine

## 2016-09-10 ENCOUNTER — Ambulatory Visit: Payer: Self-pay

## 2016-09-10 ENCOUNTER — Ambulatory Visit (HOSPITAL_COMMUNITY)
Admission: EM | Admit: 2016-09-10 | Discharge: 2016-09-10 | Disposition: A | Discharge status: Home / Self Care | Payer: Self-pay | Attending: Emergency Medicine | Admitting: Emergency Medicine

## 2016-09-10 DIAGNOSIS — Z760 Encounter for issue of repeat prescription: Secondary | ICD-10-CM

## 2016-09-10 DIAGNOSIS — I10 Essential (primary) hypertension: Secondary | ICD-10-CM

## 2016-09-10 MED ORDER — VERAPAMIL HCL ER 360 MG PO CP24: 360.0000 mg | ORAL_CAPSULE | Freq: Every day | ORAL | 0 refills | Status: DC

## 2016-09-10 MED ORDER — PRAVASTATIN SODIUM 80 MG PO TABS: 80.0000 mg | ORAL_TABLET | Freq: Every day | ORAL | 0 refills | Status: DC

## 2016-09-10 MED ORDER — HYDROCODONE-ACETAMINOPHEN 5-325 MG PO TABS: 1.0000 | ORAL_TABLET | Freq: Four times a day (QID) | ORAL | 0 refills | Status: DC | PRN

## 2016-09-10 NOTE — Telephone Encounter (Signed)
Patient called needing Rx refilled (hydrocodone)  The number to contact her is [phone]

## 2016-09-10 NOTE — ED Triage Notes (Signed)
Pt is here for a refill of her Pravastatin and Verapamil.  She has an appointment with a new PCP on November 27.  Pt denies any issues at this time and is in NAD.

## 2016-09-10 NOTE — Telephone Encounter (Signed)
See message below °

## 2016-09-10 NOTE — ED Provider Notes (Signed)
HPI  SUBJECTIVE:  Teresa Collier is a 65 y.o. female who presents with for medication refill of her verapamil SR 360 mg daily and pravastatin 80 mg daily. She states that she has not yet run out of these medications, but she has an initial appointment with the [facility] to establish care on November 27 and states that she does not have enough medications to last her until then. She states that she does not need refills on any of her other medications. She currently has no complaints. States that she is compliant with all of her medications. She has a past medical history of hypertension hypercholesterolemia. PMD: [facility].    Past Medical History:  Diagnosis Date  . Hypertension     Past Surgical History:  Procedure Laterality Date  . BUNIONECTOMY    . OPEN REDUCTION INTERNAL FIXATION (ORIF) FOOT LISFRANC FRACTURE Left 07/25/2016   Procedure: OPEN REDUCTION INTERNAL FIXATION (ORIF) FOOT LISFRANC FRACTURE;  Surgeon: Leandrew Koyanagi, MD;  Location: [facility];  Service: Orthopedics;  Laterality: Left;  . ORIF ANKLE FRACTURE Left 07/25/2016   Procedure: OPEN REDUCTION INTERNAL FIXATION (ORIF) LEFT BIMALLEOLAR  ANKLE AND LISFRANC FRACTURES;  Surgeon: Leandrew Koyanagi, MD;  Location: [facility];  Service: Orthopedics;  Laterality: Left;    History reviewed. No pertinent family history.  Social History  Substance Use Topics  . Smoking status: Former Smoker    Packs/day: 2.00    Years: 20.00    Types: Cigarettes    Quit date: 01/27/2009  . Smokeless tobacco: Never Used  . Alcohol use No    No current facility-administered medications for this encounter.   Current Outpatient Prescriptions:  .  aspirin EC 325 MG tablet, Take 1 tablet (325 mg total) by mouth 2 (two) times daily., Disp: 84 tablet, Rfl: 0 .  hydrochlorothiazide (HYDRODIURIL) 25 MG tablet, Take 25 mg by mouth daily., Disp: , Rfl:  .  HYDROcodone-acetaminophen  (NORCO/VICODIN) 5-325 MG tablet, Take 1 tablet by mouth 2 (two) times daily as needed for moderate pain., Disp: 30 tablet, Rfl: 0 .  methocarbamol (ROBAXIN) 750 MG tablet, Take 1 tablet (750 mg total) by mouth 2 (two) times daily as needed for muscle spasms., Disp: 60 tablet, Rfl: 0 .  senna-docusate (SENOKOT S) 8.6-50 MG tablet, Take 1 tablet by mouth at bedtime as needed., Disp: 30 tablet, Rfl: 1 .  aspirin-acetaminophen-caffeine (EXCEDRIN MIGRAINE) 250-250-65 MG tablet, Take 1-2 tablets by mouth every 6 (six) hours as needed for headache., Disp: , Rfl:  .  Multiple Vitamin (MULTIVITAMIN WITH MINERALS) TABS tablet, Take 1 tablet by mouth daily., Disp: , Rfl:  .  pravastatin (PRAVACHOL) 80 MG tablet, Take 1 tablet (80 mg total) by mouth daily., Disp: 30 tablet, Rfl: 0 .  verapamil (VERELAN PM) 360 MG 24 hr capsule, Take 1 capsule (360 mg total) by mouth daily., Disp: 30 capsule, Rfl: 0  No Known Allergies   ROS  As noted in HPI.   Physical Exam  BP 156/79 (BP Location: Left Arm)   Pulse 87   Temp 98.6 F (37 C) (Oral)   SpO2 99%   Constitutional: Well developed, well nourished, no acute distress Eyes:  EOMI, conjunctiva normal bilaterally HENT: Normocephalic, atraumatic,mucus membranes moist Respiratory: Normal inspiratory effort Cardiovascular: Normal rate GI: nondistended skin: No rash, skin intact Musculoskeletal: no deformities Neurologic: Alert & oriented x 3, no focal neuro deficits Psychiatric: Speech and behavior appropriate   ED  Course   Medications - No data to display  No orders of the defined types were placed in this encounter.   No results found for this or any previous visit (from the past 24 hour(s)). No results found.  ED Clinical Impression  Medication refill   ED Assessment/Plan  Providing one month of the verapamil SR 360 mg daily at bedtime, pravastatin 80 mg daily. Follow up with her primary care physician as scheduled.  Meds ordered this  encounter  Medications  . pravastatin (PRAVACHOL) 80 MG tablet    Sig: Take 1 tablet (80 mg total) by mouth daily.    Dispense:  30 tablet    Refill:  0  . verapamil (VERELAN PM) 360 MG 24 hr capsule    Sig: Take 1 capsule (360 mg total) by mouth daily.    Dispense:  30 capsule    Refill:  0    *This clinic note was created using [employer]. Therefore, there may be occasional mistakes despite careful proofreading.  ?   Melynda Ripple, MD 09/10/16 1424

## 2016-09-10 NOTE — Telephone Encounter (Signed)
See other message

## 2016-09-10 NOTE — Telephone Encounter (Signed)
Refill #30

## 2016-09-10 NOTE — Discharge Instructions (Signed)
Go to www.goodrx.com to look up your medications. This will give you a list of where you can find your prescriptions at the most affordable prices.   The prasvastatin is $9.99 at [facility] with a free membership. The verapamil is $46.06 at [facility] aid but $35.53 at [facility].

## 2016-09-11 NOTE — Telephone Encounter (Signed)
Patient came to pick up rx

## 2016-09-12 ENCOUNTER — Ambulatory Visit: Payer: Self-pay

## 2016-09-12 DIAGNOSIS — M25672 Stiffness of left ankle, not elsewhere classified: Secondary | ICD-10-CM

## 2016-09-12 DIAGNOSIS — S82842A Displaced bimalleolar fracture of left lower leg, initial encounter for closed fracture: Secondary | ICD-10-CM

## 2016-09-12 DIAGNOSIS — M6281 Muscle weakness (generalized): Secondary | ICD-10-CM

## 2016-09-12 DIAGNOSIS — R262 Difficulty in walking, not elsewhere classified: Secondary | ICD-10-CM

## 2016-09-12 DIAGNOSIS — R600 Localized edema: Secondary | ICD-10-CM

## 2016-09-12 NOTE — Therapy (Signed)
[place], [place], [address] Phone: [phone]   Fax:  [phone]  Physical Therapy Treatment  Patient Details  Name: Teresa Collier MRN: [id number] Date of Birth: 04-Oct-1951 Referring Provider: Frankey Shown MD  Encounter Date: 09/12/2016      PT End of Session - 09/12/16 1105    Visit Number 4   Number of Visits 16   Date for PT Re-Evaluation 10/26/16   PT Start Time [id number]   PT Stop Time 1100   PT Time Calculation (min) 45 min   Activity Tolerance Patient tolerated treatment well;No increased pain   Behavior During Therapy WFL for tasks assessed/performed      Past Medical History:  Diagnosis Date  . Hypertension     Past Surgical History:  Procedure Laterality Date  . BUNIONECTOMY    . OPEN REDUCTION INTERNAL FIXATION (ORIF) FOOT LISFRANC FRACTURE Left 07/25/2016   Procedure: OPEN REDUCTION INTERNAL FIXATION (ORIF) FOOT LISFRANC FRACTURE;  Surgeon: Leandrew Koyanagi, MD;  Location: [place];  Service: Orthopedics;  Laterality: Left;  . ORIF ANKLE FRACTURE Left 07/25/2016   Procedure: OPEN REDUCTION INTERNAL FIXATION (ORIF) LEFT BIMALLEOLAR  ANKLE AND LISFRANC FRACTURES;  Surgeon: Leandrew Koyanagi, MD;  Location: [place];  Service: Orthopedics;  Laterality: Left;    There were no vitals filed for this visit.      Subjective Assessment - 09/12/16 1026    Subjective Doing Ok today ( Came in without device for walking. )   Currently in Pain? No/denies            [facility] PT Assessment - 09/12/16 0001      AROM   Left Ankle Dorsiflexion 98   Left Ankle Plantar Flexion 27   Left Ankle Inversion 20   Left Ankle Eversion 20     PROM   Left Ankle Plantar Flexion 27                     [facility] Adult PT Treatment/Exercise - 09/12/16 1027      Manual Therapy   Joint Mobilization AP and PA glides Gr 3-4 for DF and PF LT ankle    Passive ROM All 4  direstions  20-30 sec 5 reps each   Manual Traction ankle joint with distraction and talar/calcania tilts /stretching and inversion/eversion  ROm     Ankle Exercises: Aerobic   Stationary Bike Nustep L4 6 min LE only     Ankle Exercises: Seated   Other Seated Ankle Exercises sit fit ROM all directions then active inversion and eversion x 33 reps with PT asssit to control knee motion to isolate ankle movement.    Other Seated Ankle Exercises --                PT Education - 09/12/16 1104    Education provided Yes   Education Details DF stretch in standing   Person(s) Educated Patient   Methods Explanation;Verbal cues;Handout;Demonstration   Comprehension Returned demonstration;Verbalized understanding          PT Short Term Goals - 08/28/16 1203      PT SHORT TERM GOAL #1   Title Independent with inital HEP   Time 3   Period Weeks   Status New     PT SHORT TERM GOAL #2   Title She will walk full time without assitive device in and out of home   Time 4   Period Weeks  Status New     PT SHORT TERM GOAL #3   Title Pain level will remain mild 1-3/10   Time 4   Period Weeks   Status New     PT SHORT TERM GOAL #4   Title DF wil incr to 102 degrees LT to facilitae walking out of  CAM walker    Time 4   Period Weeks   Status New           PT Long Term Goals - 08/28/16 1205      PT LONG TERM GOAL #1   Title She will be independent with all HEP issued as of last visit   Time 8   Period Weeks   Status New     PT LONG TERM GOAL #2   Title She will have intermittant mild pain after walking  to do normal shopping tasks   Time 8   Period Weeks   Status New     PT LONG TERM GOAL #3   Title She will be able to walk steps step over step with one rail safely   Time 8   Period Weeks   Status New     PT LONG TERM GOAL #4   Title she will be able to stand single leg for at least 10 seconds to improve safety with walking   Time 8   Period Weeks   Status  New     PT LONG TERM GOAL #5   Title She will return to normal home tasks with 1-2/10 pain   Time 8   Period Weeks   Status New               Plan - 09/12/16 1105    Clinical Impression Statement No pain after session . She is able to walk comfortably without device now and her ROM is improveing activitly. PF is most painfut motion   PT Treatment/Interventions Cryotherapy;Electrical Stimulation;Moist Heat;Vasopneumatic Device;Taping;Manual techniques;Passive range of motion;Patient/family education;Balance training;Therapeutic exercise;Gait training;Stair training   PT Next Visit Plan  band exercises for home review , manual , modalities, closed chaing /weight bearing exercise if not too painful   PT Home Exercise Plan AROM exer and DF stretching, Red band ankle strength 4 way, standing DF stretch   Consulted and Agree with Plan of Care Patient      Patient will benefit from skilled therapeutic intervention in order to improve the following deficits and impairments:  Pain, Increased edema, Decreased strength, Decreased activity tolerance, Difficulty walking, Decreased range of motion  Visit Diagnosis: Closed bimalleolar fracture of left ankle, initial encounter  Stiffness of left ankle, not elsewhere classified  Localized edema  Muscle weakness (generalized)  Difficulty in walking, not elsewhere classified     Problem List Patient Active Problem List   Diagnosis Date Noted  . Closed disp fracture of left lateral malleolus with routine healing 08/23/2016    Darrel Hoover  PT 09/12/2016, 11:09 AM  [facility] [address] [place], [place], [address] Phone: [phone]   Fax:  [phone]  Name: Teresa Collier MRN: [id number] Date of Birth: [birth year]

## 2016-09-12 NOTE — Patient Instructions (Signed)
Standing DF stretch with knee extended and flexed  20-30 sec x 2-3 reps 2x/day without boot

## 2016-09-17 ENCOUNTER — Ambulatory Visit: Payer: Self-pay | Admitting: Physical Therapy

## 2016-09-17 DIAGNOSIS — M25672 Stiffness of left ankle, not elsewhere classified: Secondary | ICD-10-CM

## 2016-09-17 DIAGNOSIS — M6281 Muscle weakness (generalized): Secondary | ICD-10-CM

## 2016-09-17 DIAGNOSIS — S82842A Displaced bimalleolar fracture of left lower leg, initial encounter for closed fracture: Secondary | ICD-10-CM

## 2016-09-17 DIAGNOSIS — R600 Localized edema: Secondary | ICD-10-CM

## 2016-09-17 DIAGNOSIS — R262 Difficulty in walking, not elsewhere classified: Secondary | ICD-10-CM

## 2016-09-17 NOTE — Therapy (Signed)
[place], [place], [address] Phone: [phone]   Fax:  [phone]  Physical Therapy Treatment  Patient Details  Name: Teresa Collier MRN: [id number] Date of Birth: 10/09/1951 Referring Provider: Frankey Shown MD  Encounter Date: 09/17/2016    Past Medical History:  Diagnosis Date  . Hypertension     Past Surgical History:  Procedure Laterality Date  . BUNIONECTOMY    . OPEN REDUCTION INTERNAL FIXATION (ORIF) FOOT LISFRANC FRACTURE Left 07/25/2016   Procedure: OPEN REDUCTION INTERNAL FIXATION (ORIF) FOOT LISFRANC FRACTURE;  Surgeon: Leandrew Koyanagi, MD;  Location: [place];  Service: Orthopedics;  Laterality: Left;  . ORIF ANKLE FRACTURE Left 07/25/2016   Procedure: OPEN REDUCTION INTERNAL FIXATION (ORIF) LEFT BIMALLEOLAR  ANKLE AND LISFRANC FRACTURES;  Surgeon: Leandrew Koyanagi, MD;  Location: [place];  Service: Orthopedics;  Laterality: Left;    There were no vitals filed for this visit.      Subjective Assessment - 09/17/16 1026    Subjective I have started walking on my foot in the house a little.  Barefoot.  I wear my boot when I am outside.  5/10 pain today.  Swelling is improved in the AM increases as day goes on.  She is able to walk in community with boot.   Currently in Pain? Yes   Pain Score 5    Pain Location Ankle   Pain Orientation Medial;Anterior   Pain Descriptors / Indicators Shooting;Aching   Pain Type Chronic pain   Pain Frequency Intermittent   Aggravating Factors  weightbearing,  when boot is removed at night.    Pain Relieving Factors resting, elevation, medication , 2 pillows with sidelyin.    Effect of Pain on Daily Activities Has not done steps too much, needs rail. with assist.   Multiple Pain Sites No            [facility] PT Assessment - 09/17/16 0001      Figure 8 Edema   Figure 8 - Right  --  25 cm malloli Left, right 25 cm.                      [facility] Adult PT Treatment/Exercise - 09/17/16 0001      Manual Therapy   Manual therapy comments gentle stretch foot , toes,  foot is stiff     Ankle Exercises: Aerobic   Stationary Bike Nustep L4 6 min LE only     Ankle Exercises: Seated   Heel Raises 10 reps  toes stiff   Heel Slides 10 reps  flexion and extension,  stretch end ranges   Other Seated Ankle Exercises 3 way red band 10 X each   Other Seated Ankle Exercises hamstring flexion red band  10 x                PT Education - 09/17/16 1102    Education provided Yes   Education Details use tennis ball at night for foot mobility,  stretch toes   Person(s) Educated Patient   Methods Explanation;Demonstration   Comprehension Verbalized understanding;Returned demonstration          PT Short Term Goals - 09/17/16 1102      PT SHORT TERM GOAL #1   Title Independent with inital HEP   Baseline minor cues   Time 3   Period Weeks   Status On-going     PT SHORT TERM GOAL #2  Title She will walk full time without assitive device in and out of home   Time 4   Period Weeks   Status On-going     PT SHORT TERM GOAL #3   Title Pain level will remain mild 1-3/10   Baseline pain varies   Time 4   Period Weeks   Status On-going     PT SHORT TERM GOAL #4   Title DF wil incr to 102 degrees LT to facilitae walking out of  CAM walker    Time 4   Period Weeks   Status Unable to assess           PT Long Term Goals - 08/28/16 1205      PT LONG TERM GOAL #1   Title She will be independent with all HEP issued as of last visit   Time 8   Period Weeks   Status New     PT LONG TERM GOAL #2   Title She will have intermittant mild pain after walking  to do normal shopping tasks   Time 8   Period Weeks   Status New     PT LONG TERM GOAL #3   Title She will be able to walk steps step over step with one rail safely   Time 8   Period Weeks   Status New     PT LONG TERM GOAL  #4   Title she will be able to stand single leg for at least 10 seconds to improve safety with walking   Time 8   Period Weeks   Status New     PT LONG TERM GOAL #5   Title She will return to normal home tasks with 1-2/10 pain   Time 8   Period Weeks   Status New             Patient will benefit from skilled therapeutic intervention in order to improve the following deficits and impairments:     Visit Diagnosis: Closed bimalleolar fracture of left ankle, initial encounter  Stiffness of left ankle, not elsewhere classified  Localized edema  Muscle weakness (generalized)  Difficulty in walking, not elsewhere classified     Problem List Patient Active Problem List   Diagnosis Date Noted  . Closed disp fracture of left lateral malleolus with routine healing 08/23/2016    HARRIS,KAREN PTA 09/17/2016, 11:04 AM  [facility] [address] [place], [place], [address] Phone: [phone]   Fax:  [phone]  Name: Teresa Collier MRN: [id number] Date of Birth: [birth year]

## 2016-09-19 ENCOUNTER — Ambulatory Visit: Payer: Self-pay

## 2016-09-19 ENCOUNTER — Telehealth (INDEPENDENT_AMBULATORY_CARE_PROVIDER_SITE_OTHER): Payer: Self-pay | Admitting: *Deleted

## 2016-09-19 NOTE — Telephone Encounter (Signed)
Pt called stating she has some questions about her boot. She is wanting to know if this can be taken off Sunday for her to go to church. CB: [phone]

## 2016-09-19 NOTE — Telephone Encounter (Signed)
Called pt to advise

## 2016-09-24 ENCOUNTER — Encounter: Payer: Self-pay | Admitting: Family Medicine

## 2016-09-24 ENCOUNTER — Ambulatory Visit: Payer: Self-pay

## 2016-09-24 ENCOUNTER — Ambulatory Visit (INDEPENDENT_AMBULATORY_CARE_PROVIDER_SITE_OTHER): Payer: Self-pay | Admitting: Family Medicine

## 2016-09-24 VITALS — BP 121/67 | HR 82 | Temp 98.2°F | Resp 15 | Ht 64.0 in | Wt 175.0 lb

## 2016-09-24 DIAGNOSIS — R600 Localized edema: Secondary | ICD-10-CM

## 2016-09-24 DIAGNOSIS — R262 Difficulty in walking, not elsewhere classified: Secondary | ICD-10-CM

## 2016-09-24 DIAGNOSIS — S82842A Displaced bimalleolar fracture of left lower leg, initial encounter for closed fracture: Secondary | ICD-10-CM

## 2016-09-24 DIAGNOSIS — M25672 Stiffness of left ankle, not elsewhere classified: Secondary | ICD-10-CM

## 2016-09-24 DIAGNOSIS — Z1239 Encounter for other screening for malignant neoplasm of breast: Secondary | ICD-10-CM

## 2016-09-24 DIAGNOSIS — Z1231 Encounter for screening mammogram for malignant neoplasm of breast: Secondary | ICD-10-CM

## 2016-09-24 DIAGNOSIS — Z0000 Encounter for general adult medical examination without abnormal findings: Secondary | ICD-10-CM

## 2016-09-24 DIAGNOSIS — M6281 Muscle weakness (generalized): Secondary | ICD-10-CM

## 2016-09-24 DIAGNOSIS — Z23 Encounter for immunization: Secondary | ICD-10-CM

## 2016-09-24 LAB — CBC WITH DIFFERENTIAL/PLATELET
BASOS PCT: 0 %
Basophils Absolute: 0 cells/uL (ref 0–200)
EOS ABS: 96 {cells}/uL (ref 15–500)
Eosinophils Relative: 1 %
HEMATOCRIT: 38.4 % (ref 35.0–45.0)
Hemoglobin: 12.6 g/dL (ref 11.7–15.5)
Lymphocytes Relative: 45 %
Lymphs Abs: 4320 cells/uL — ABNORMAL HIGH (ref 850–3900)
MCH: 29.8 pg (ref 27.0–33.0)
MCHC: 32.8 g/dL (ref 32.0–36.0)
MCV: 90.8 fL (ref 80.0–100.0)
MONO ABS: 576 {cells}/uL (ref 200–950)
MPV: 10.1 fL (ref 7.5–12.5)
Monocytes Relative: 6 %
NEUTROS ABS: 4608 {cells}/uL (ref 1500–7800)
Neutrophils Relative %: 48 %
PLATELETS: 307 10*3/uL (ref 140–400)
RBC: 4.23 MIL/uL (ref 3.80–5.10)
RDW: 14.1 % (ref 11.0–15.0)
WBC: 9.6 10*3/uL (ref 3.8–10.8)

## 2016-09-24 LAB — LIPID PANEL
CHOL/HDL RATIO: 3.4 ratio (ref <5.0)
Cholesterol: 150 mg/dL (ref <200)
HDL: 44 mg/dL — AB (ref >50)
LDL CALC: 70 mg/dL (ref <100)
Triglycerides: 181 mg/dL — ABNORMAL HIGH (ref <150)
VLDL: 36 mg/dL — ABNORMAL HIGH (ref <30)

## 2016-09-24 LAB — HEMOGLOBIN A1C
HEMOGLOBIN A1C: 5.4 % (ref <5.7)
Mean Plasma Glucose: 108 mg/dL

## 2016-09-24 MED ORDER — VERAPAMIL HCL ER 360 MG PO CP24: 360.0000 mg | ORAL_CAPSULE | Freq: Every day | ORAL | 1 refills | Status: DC

## 2016-09-24 MED ORDER — PRAVASTATIN SODIUM 80 MG PO TABS: 80.0000 mg | ORAL_TABLET | Freq: Every day | ORAL | 0 refills | Status: DC

## 2016-09-24 MED ORDER — HYDROCHLOROTHIAZIDE 25 MG PO TABS: 25.0000 mg | ORAL_TABLET | Freq: Every day | ORAL | 1 refills | Status: DC

## 2016-09-24 MED ORDER — PNEUMOCOCCAL 13-VAL CONJ VACC IM SUSP
0.5000 mL | INTRAMUSCULAR | Status: AC
  Administered 2016-09-24: 0.5 mL via INTRAMUSCULAR

## 2016-09-24 MED FILL — HYDROCHLOROTHIAZIDE 25 MG T: 25 | 30 days supply | Qty: 30 | Fill #0

## 2016-09-24 MED FILL — VERAPAMIL 360 MG CAP PELLET: 360 | 90 days supply | Qty: 90 | Fill #0

## 2016-09-24 MED FILL — PRAVASTATIN NA 80 MG TAB: 80 | 30 days supply | Qty: 30 | Fill #0

## 2016-09-24 NOTE — Progress Notes (Addendum)
Teresa Collier, is a 65 y.o. female  [id number]  [id number]  DOB - Mar 14, 1951  CC:  Chief Complaint  Patient presents with  . new patient/get established    needs refill on meds, verapamil, HCTZ and pravastatin, Dr Erlinda Hong ortho did surgery 07/25/16 [facility] still wearing boot but improving       HPI: Teresa Collier is a 65 y.o. female here to establish care. She has a history of hypertension and hypercholesterolemia. She needs refills on HCtZ, verapamil and pravachol. She also takes ASA. She reports no other complaints today.  Health maintenance: Needs tdap and pneumonia. Needs PAP and mammogram, needs screening for colon cancer.   No Known Allergies Past Medical History:  Diagnosis Date  . Hyperlipidemia   . Hypertension    Current Outpatient Prescriptions on File Prior to Visit  Medication Sig Dispense Refill  . aspirin EC 325 MG tablet Take 1 tablet (325 mg total) by mouth 2 (two) times daily. 84 tablet 0   No current facility-administered medications on file prior to visit.    History reviewed. No pertinent family history. Social History   Social History  . Marital status: Married    Spouse name: N/A  . Number of children: N/A  . Years of education: N/A   Occupational History  . Not on file.   Social History Main Topics  . Smoking status: Former Smoker    Packs/day: 2.00    Years: 20.00    Types: Cigarettes    Quit date: 01/27/2009  . Smokeless tobacco: Never Used  . Alcohol use No     Comment: stopped drinking 2010  . Drug use: No  . Sexual activity: Not on file   Other Topics Concern  . Not on file   Social History Narrative  . No narrative on file    Review of Systems: Constitutional: Negative Skin: Negative HENT: Negative  Eyes: Negative  Neck: Negative Respiratory: Negative Cardiovascular: Negative Gastrointestinal: Negative Genitourinary: Negative  Musculoskeletal: Negative except pain in left leg and foot following surgery  recently Neurological: Negative  Hematological: Negative  Psychiatric/Behavioral: Negative    Objective:   Vitals:   09/24/16 1417  BP: 121/67  Pulse: 82  Resp: 15  Temp: 98.2 F (36.8 C)    Physical Exam: Constitutional: Patient appears well-developed and well-nourished. No distress. HENT: Normocephalic, atraumatic, External right and left ear normal. Oropharynx is clear and moist.  Eyes: Conjunctivae and EOM are normal. PERRLA, no scleral icterus. Neck: Normal ROM. Neck supple. No lymphadenopathy, No thyromegaly. CVS: RRR, S1/S2 +, no murmurs, no gallops, no rubs Pulmonary: Effort and breath sounds normal, no stridor, rhonchi, wheezes, rales.  Abdominal: Soft. Normoactive BS,, no distension, tenderness, rebound or guarding.  Musculoskeletal: Normal range of motion. No edema and no tenderness. L. Lower leg in splint Neuro: Alert.Normal muscle tone coordination. Non-focal Skin: Skin is warm and dry. No rash noted. Not diaphoretic. No erythema. No pallor. Psychiatric: Normal mood and affect. Behavior, judgment, thought content normal.  Lab Results  Component Value Date   WBC 9.6 09/24/2016   HGB 12.6 09/24/2016   HCT 38.4 09/24/2016   MCV 90.8 09/24/2016   PLT 307 09/24/2016   Lab Results  Component Value Date   CREATININE 0.96 07/12/2016   BUN 8 07/12/2016   NA 141 07/12/2016   K 4.9 07/12/2016   CL 104 07/12/2016   CO2 29 07/12/2016    Lab Results  Component Value Date   HGBA1C 5.4 09/24/2016   Lipid Panel  Component Value Date/Time   CHOL 150 09/24/2016 1509   TRIG 181 (H) 09/24/2016 1509   HDL 44 (L) 09/24/2016 1509   CHOLHDL 3.4 09/24/2016 1509   VLDL 36 (H) 09/24/2016 1509   LDLCALC 70 09/24/2016 1509        Assessment and plan:   1. Healthcare maintenance  - Hemoglobin A1c - CBC with Differential - Lipid panel - Hepatitis C Antibody - POC Hemoccult Bld/Stl (3-Cd Home Screen); Future  2. Screening for breast cancer  - MM DIGITAL  SCREENING BILATERAL; Future  3. Need for prophylactic vaccination against Streptococcus pneumoniae (pneumococcus)  - pneumococcal 13-valent conjugate vaccine (PREVNAR 13) injection 0.5 mL; Inject 0.5 mLs into the muscle tomorrow at 10 am.  4. Need for Tdap vaccination  - Tdap vaccine greater than or equal to 7yo IM   Return in about 6 months (around 03/24/2017) for PAP.  The patient was given clear instructions to go to ER or return to medical center if symptoms don't improve, worsen or new problems develop. The patient verbalized understanding.    Micheline Chapman FNP  09/25/2016, 8:02 AM

## 2016-09-24 NOTE — Therapy (Signed)
[place], [place], [address] Phone: [phone]   Fax:  [phone]  Physical Therapy Treatment  Patient Details  Name: Maili Shutters MRN: [id number] Date of Birth: 09-10-1951 Referring Provider: Frankey Shown MD  Encounter Date: 09/24/2016      PT End of Session - 09/24/16 1023    Visit Number 5   Number of Visits 16   Date for PT Re-Evaluation 10/26/16   PT Start Time [id number]   PT Stop Time 1100   PT Time Calculation (min) 45 min   Activity Tolerance Patient tolerated treatment well;No increased pain   Behavior During Therapy WFL for tasks assessed/performed      Past Medical History:  Diagnosis Date  . Hypertension     Past Surgical History:  Procedure Laterality Date  . BUNIONECTOMY    . OPEN REDUCTION INTERNAL FIXATION (ORIF) FOOT LISFRANC FRACTURE Left 07/25/2016   Procedure: OPEN REDUCTION INTERNAL FIXATION (ORIF) FOOT LISFRANC FRACTURE;  Surgeon: Leandrew Koyanagi, MD;  Location: [place];  Service: Orthopedics;  Laterality: Left;  . ORIF ANKLE FRACTURE Left 07/25/2016   Procedure: OPEN REDUCTION INTERNAL FIXATION (ORIF) LEFT BIMALLEOLAR  ANKLE AND LISFRANC FRACTURES;  Surgeon: Leandrew Koyanagi, MD;  Location: [place];  Service: Orthopedics;  Laterality: Left;    There were no vitals filed for this visit.      Subjective Assessment - 09/24/16 1029    Subjective MD OK breif periods without CAM walker She went to church and did not have increased pain.    Currently in Pain? No/denies  pain mostly with with exercise and pressure to great toe .                         [facility] Adult PT Treatment/Exercise - 09/24/16 0001      Ambulation/Gait   Assistive device None   Gait Pattern Decreased trunk rotation;Antalgic;Decreased stance time - left;Decreased step length - right     Neuro Re-ed    Neuro Re-ed Details  Worked on gait pattern with slight  splay to both feet and work on DF then foot flat with weight shift RT and Lt to equalize patten and decr pain  into RT big toe.      Ankle Exercises: Aerobic   Stationary Bike Nustep L4    7 min LE only     Ankle Exercises: Supine   T-Band Green bband x 20 reps 4 way LT ankle.                 PT Education - 09/24/16 1100    Education provided Yes   Education Details walking with slight splay of feet with weight shift  equal R to L to R   Person(s) Educated Patient   Methods Explanation;Demonstration;Verbal cues   Comprehension Verbalized understanding;Returned demonstration          PT Short Term Goals - 09/24/16 1112      PT SHORT TERM GOAL #1   Title Independent with inital HEP   Baseline minor cues   Status Partially Met     PT SHORT TERM GOAL #2   Title She will walk full time without assitive device in and out of home   Baseline No UE support but still with CAM walker   Status Partially Met     PT SHORT TERM GOAL #3   Title Pain level will remain mild 1-3/10  Baseline ankle pain acheived , toe pain > 3/10 at times   Status Partially Met     PT SHORT TERM GOAL #4   Title DF wil incr to 102 degrees LT to facilitae walking out of  CAM walker    Status Unable to assess           PT Long Term Goals - 08/28/16 1205      PT LONG TERM GOAL #1   Title She will be independent with all HEP issued as of last visit   Time 8   Period Weeks   Status New     PT LONG TERM GOAL #2   Title She will have intermittant mild pain after walking  to do normal shopping tasks   Time 8   Period Weeks   Status New     PT LONG TERM GOAL #3   Title She will be able to walk steps step over step with one rail safely   Time 8   Period Weeks   Status New     PT LONG TERM GOAL #4   Title she will be able to stand single leg for at least 10 seconds to improve safety with walking   Time 8   Period Weeks   Status New     PT LONG TERM GOAL #5   Title She will return to  normal home tasks with 1-2/10 pain   Time 8   Period Weeks   Status New               Plan - 09/24/16 1023    Clinical Impression Statement MD allowed her to go brief periods without CAM walker per pt.    PAin in great toe causes difficulty walking without boot so we worked on pattern to decrease pressure to RT toe but to equalize  weight shifting and stride length . She will practice at home with something to have contact with for balance . She reported less pain with this pattern   PT Treatment/Interventions Cryotherapy;Electrical Stimulation;Moist Heat;Vasopneumatic Device;Taping;Manual techniques;Passive range of motion;Patient/family education;Balance training;Therapeutic exercise;Gait training;Stair training   PT Next Visit Plan  band exercises for home review , manual , modalities, closed chain /weight bearing exercise if not too painful, Measure ROM next visit   PT Home Exercise Plan AROM exer and DF stretching, Red band ankle strength 4 way, standing DF stretch, weight shifting RT to TL with stepping with slight splay feet to decr pressure to big toe   Consulted and Agree with Plan of Care Patient      Patient will benefit from skilled therapeutic intervention in order to improve the following deficits and impairments:  Pain, Increased edema, Decreased strength, Decreased activity tolerance, Difficulty walking, Decreased range of motion  Visit Diagnosis: Closed bimalleolar fracture of left ankle, initial encounter  Stiffness of left ankle, not elsewhere classified  Localized edema  Muscle weakness (generalized)  Difficulty in walking, not elsewhere classified     Problem List Patient Active Problem List   Diagnosis Date Noted  . Closed disp fracture of left lateral malleolus with routine healing 08/23/2016    Darrel Hoover  PT 09/24/2016, 11:15 AM  [facility] [address] [place], [place],  [address] Phone: [phone]   Fax:  [phone]  Name: Dandria Griego MRN: [id number] Date of Birth: Sep 03, 1951

## 2016-09-24 NOTE — Patient Instructions (Signed)
Continue current treatment at this time Return for PAP in next few weeks.

## 2016-09-25 LAB — HEPATITIS C ANTIBODY: HCV AB: NEGATIVE

## 2016-09-26 ENCOUNTER — Ambulatory Visit: Payer: Self-pay | Admitting: Physical Therapy

## 2016-09-26 DIAGNOSIS — R600 Localized edema: Secondary | ICD-10-CM

## 2016-09-26 DIAGNOSIS — R262 Difficulty in walking, not elsewhere classified: Secondary | ICD-10-CM

## 2016-09-26 DIAGNOSIS — M6281 Muscle weakness (generalized): Secondary | ICD-10-CM

## 2016-09-26 DIAGNOSIS — M25672 Stiffness of left ankle, not elsewhere classified: Secondary | ICD-10-CM

## 2016-09-26 DIAGNOSIS — S82842A Displaced bimalleolar fracture of left lower leg, initial encounter for closed fracture: Secondary | ICD-10-CM

## 2016-09-26 NOTE — Therapy (Signed)
[place], [place], [address] Phone: [phone]   Fax:  [phone]  Physical Therapy Treatment  Patient Details  Name: Teresa Collier MRN: [id number] Date of Birth: [birth year] Referring Provider: Frankey Shown MD  Encounter Date: 09/26/2016      PT End of Session - 09/26/16 1219    Visit Number 6   Number of Visits 16   Date for PT Re-Evaluation 10/26/16   PT Start Time 1017   PT Stop Time 1103   PT Time Calculation (min) 46 min   Activity Tolerance Patient tolerated treatment well   Behavior During Therapy [facility] for tasks assessed/performed      Past Medical History:  Diagnosis Date  . Hyperlipidemia   . Hypertension     Past Surgical History:  Procedure Laterality Date  . APPENDECTOMY    . BUNIONECTOMY    . OPEN REDUCTION INTERNAL FIXATION (ORIF) FOOT LISFRANC FRACTURE Left 07/25/2016   Procedure: OPEN REDUCTION INTERNAL FIXATION (ORIF) FOOT LISFRANC FRACTURE;  Surgeon: Leandrew Koyanagi, MD;  Location: [place];  Service: Orthopedics;  Laterality: Left;  . ORIF ANKLE FRACTURE Left 07/25/2016   Procedure: OPEN REDUCTION INTERNAL FIXATION (ORIF) LEFT BIMALLEOLAR  ANKLE AND LISFRANC FRACTURES;  Surgeon: Leandrew Koyanagi, MD;  Location: [place];  Service: Orthopedics;  Laterality: Left;    There were no vitals filed for this visit.      Subjective Assessment - 09/26/16 1031    Subjective Sees MD next week.  She has been walking some out of the boot.  She was out of the boot most of the day Yesterday.     Currently in Pain? --  Nonumber given when asked,  sore vs pain   Pain Orientation --  ball of foot, near great toe   Pain Descriptors / Indicators Sore  brief   Pain Frequency Intermittent   Aggravating Factors  weightbearing   Pain Relieving Factors shifting weight to the outside sometimes helps   Effect of Pain on Daily Activities boot intermittantly   Multiple Pain Sites No                         [facility] Adult PT Treatment/Exercise - 09/26/16 0001      Ambulation/Gait   Assistive device None  single point cane   Gait Comments walking practiced with 2 shoes .  with and without the cane,  patient able to improve her foot position and feeling of safety improved with the use of a cane..     Ankle Exercises: Seated   Heel Raises 10 reps  foot on airex   Heel Slides 10 reps  foot sliding on pillowcase for DF/PF     Ankle Exercises: Supine   T-Band Green band x 20 reps 4 way LT ankle.   issued green band for home     Ankle Exercises: Aerobic   Stationary Bike       Ankle Exercises: Stretches   Other Stretch 2 minutes ball rolling under foot .                PT Education - 09/26/16 1219    Education Details gait training   Person(s) Educated Patient   Methods Demonstration;Verbal cues   Comprehension Verbalized understanding;Returned demonstration          PT Short Term Goals - 09/24/16 1112      PT SHORT TERM GOAL #1  Title Independent with inital HEP   Baseline minor cues   Status Partially Met     PT SHORT TERM GOAL #2   Title She will walk full time without assitive device in and out of home   Baseline No UE support but still with CAM walker   Status Partially Met     PT SHORT TERM GOAL #3   Title Pain level will remain mild 1-3/10   Baseline ankle pain acheived , toe pain > 3/10 at times   Status Partially Met     PT SHORT TERM GOAL #4   Title DF wil incr to 102 degrees LT to facilitae walking out of  CAM walker    Status Unable to assess           PT Long Term Goals - 08/28/16 1205      PT LONG TERM GOAL #1   Title She will be independent with all HEP issued as of last visit   Time 8   Period Weeks   Status New     PT LONG TERM GOAL #2   Title She will have intermittant mild pain after walking  to do normal shopping tasks   Time 8   Period Weeks   Status New      PT LONG TERM GOAL #3   Title She will be able to walk steps step over step with one rail safely   Time 8   Period Weeks   Status New     PT LONG TERM GOAL #4   Title she will be able to stand single leg for at least 10 seconds to improve safety with walking   Time 8   Period Weeks   Status New     PT LONG TERM GOAL #5   Title She will return to normal home tasks with 1-2/10 pain   Time 8   Period Weeks   Status New               Plan - 09/26/16 1220    Clinical Impression Statement Patient had less splay of feet with use of cane.  Extra time required during session as patient works slowly and deliberately.  Progress toward gait goal.  Patient plans to get a cane.  N0 pain increased at end of session.   PT Next Visit Plan  Measure , manual , modalities, closed chain /weight bearing exercise if not too painful,   PT Home Exercise Plan AROM exer and DF stretching, Red band ankle strength 4 way, standing DF stretch, weight shifting RT to TL with stepping with slight splay feet to decr pressure to big toe      Patient will benefit from skilled therapeutic intervention in order to improve the following deficits and impairments:  Pain, Increased edema, Decreased strength, Decreased activity tolerance, Difficulty walking, Decreased range of motion  Visit Diagnosis: Closed bimalleolar fracture of left ankle, initial encounter  Stiffness of left ankle, not elsewhere classified  Localized edema  Muscle weakness (generalized)  Difficulty in walking, not elsewhere classified     Problem List Patient Active Problem List   Diagnosis Date Noted  . Closed disp fracture of left lateral malleolus with routine healing 08/23/2016    Khy Pitre PTA 09/26/2016, 12:24 PM  [facility] [address] [place], [place], [address] Phone: [phone]   Fax:  [phone]  Name: Teresa Collier MRN: [id number] Date of Birth:  1951/01/27

## 2016-10-02 ENCOUNTER — Ambulatory Visit: Payer: Self-pay | Attending: Orthopaedic Surgery | Admitting: Physical Therapy

## 2016-10-02 DIAGNOSIS — R262 Difficulty in walking, not elsewhere classified: Secondary | ICD-10-CM | POA: Insufficient documentation

## 2016-10-02 DIAGNOSIS — S82842A Displaced bimalleolar fracture of left lower leg, initial encounter for closed fracture: Secondary | ICD-10-CM | POA: Insufficient documentation

## 2016-10-02 DIAGNOSIS — M25672 Stiffness of left ankle, not elsewhere classified: Secondary | ICD-10-CM | POA: Insufficient documentation

## 2016-10-02 DIAGNOSIS — M6281 Muscle weakness (generalized): Secondary | ICD-10-CM | POA: Insufficient documentation

## 2016-10-02 DIAGNOSIS — R600 Localized edema: Secondary | ICD-10-CM | POA: Insufficient documentation

## 2016-10-02 NOTE — Therapy (Signed)
[place], [place], [address] Phone: [phone]   Fax:  [phone]  Physical Therapy Treatment  Patient Details  Name: Teresa Collier MRN: [id number] Date of Birth: 1951/01/29 Referring Provider: Frankey Shown MD  Encounter Date: 10/02/2016      PT End of Session - 10/02/16 0911    Visit Number 7   Number of Visits 16   Date for PT Re-Evaluation 10/26/16   PT Start Time 0800   PT Stop Time 0845   PT Time Calculation (min) 45 min   Activity Tolerance Patient tolerated treatment well   Behavior During Therapy [facility] for tasks assessed/performed      Past Medical History:  Diagnosis Date  . Hyperlipidemia   . Hypertension     Past Surgical History:  Procedure Laterality Date  . APPENDECTOMY    . BUNIONECTOMY    . OPEN REDUCTION INTERNAL FIXATION (ORIF) FOOT LISFRANC FRACTURE Left 07/25/2016   Procedure: OPEN REDUCTION INTERNAL FIXATION (ORIF) FOOT LISFRANC FRACTURE;  Surgeon: Leandrew Koyanagi, MD;  Location: [place];  Service: Orthopedics;  Laterality: Left;  . ORIF ANKLE FRACTURE Left 07/25/2016   Procedure: OPEN REDUCTION INTERNAL FIXATION (ORIF) LEFT BIMALLEOLAR  ANKLE AND LISFRANC FRACTURES;  Surgeon: Leandrew Koyanagi, MD;  Location: [place];  Service: Orthopedics;  Laterality: Left;    There were no vitals filed for this visit.      Subjective Assessment - 10/02/16 0808    Subjective Less pain with walking under the toes.  Able to wear regular boots to church, zip ups and I was just a little sore (2.5 hours)  Swelling is a lot better.   Currently in Pain? No/denies   Pain Score --  mild   Pain Location Ankle   Pain Orientation Left   Pain Descriptors / Indicators Sore   Pain Frequency Intermittent   Aggravating Factors  end of day,  weaning from boot to other shoes   Pain Relieving Factors rest, elevation, stretches   Effect of Pain on Daily Activities needs  boot mostly in community per MD per Patient            [facility] PT Assessment - 10/02/16 0001      Assessment   Medical Diagnosis `     AROM   Left Ankle Dorsiflexion 96   Left Ankle Plantar Flexion 30   Left Ankle Inversion 22   Left Ankle Eversion 20                     [facility] Adult PT Treatment/Exercise - 10/02/16 0001      Manual Therapy   Manual therapy comments scar tissue anerior foot.     Ankle Exercises: Stretches   Other Stretch 2 minutes ball rolling under foot .     Ankle Exercises: Supine   T-Band Green band x 20 reps 4 way LT ankle.   issued green band for home     Ankle Exercises: Seated   Towel Crunch --  10 X   Towel Inversion/Eversion --  10 x pulling 2, 1 LBS   Heel Raises 10 reps  5 LBS on knee 10 X 2 sets                  PT Short Term Goals - 10/02/16 [id number]      PT SHORT TERM GOAL #1   Title Independent with inital HEP   Baseline independent  Time 3   Period Weeks   Status Achieved     PT SHORT TERM GOAL #2   Title She will walk full time without assitive device in and out of home   Baseline uses cane to help with pain and balance   Time 4   Period Weeks   Status On-going     PT SHORT TERM GOAL #3   Title Pain level will remain mild 1-3/10   Baseline up to 4-5/10 nat end of the day.   Time 4     PT SHORT TERM GOAL #4   Title DF wil incr to 102 degrees LT to facilitae walking out of  CAM walker    Baseline 96   Time 4   Period Weeks   Status On-going           PT Long Term Goals - 08/28/16 1205      PT LONG TERM GOAL #1   Title She will be independent with all HEP issued as of last visit   Time 8   Period Weeks   Status New     PT LONG TERM GOAL #2   Title She will have intermittant mild pain after walking  to do normal shopping tasks   Time 8   Period Weeks   Status New     PT LONG TERM GOAL #3   Title She will be able to walk steps step over step with one rail safely   Time 8   Period  Weeks   Status New     PT LONG TERM GOAL #4   Title she will be able to stand single leg for at least 10 seconds to improve safety with walking   Time 8   Period Weeks   Status New     PT LONG TERM GOAL #5   Title She will return to normal home tasks with 1-2/10 pain   Time 8   Period Weeks   Status New               Plan - 10/02/16 1452    Clinical Impression Statement Patient forgot to bring her shoes to PT so her exercises were NWB.  ROM DF 6, PF 30 , IV 22, EV 20. progress toward ROM goals.  Patient has less pain and less fear of falling with use of cane.    PT Next Visit Plan Patrient to bring shoes for closed chain exercises, see what MD says.     PT Home Exercise Plan AROM exer and DF stretching, Red band ankle strength 4 way, standing DF stretch, weight shifting RT to TL with stepping with slight splay feet to decr pressure to big toe   Consulted and Agree with Plan of Care Patient      Patient will benefit from skilled therapeutic intervention in order to improve the following deficits and impairments:  Pain, Increased edema, Decreased strength, Decreased activity tolerance, Difficulty walking, Decreased range of motion  Visit Diagnosis: Closed bimalleolar fracture of left ankle, initial encounter  Stiffness of left ankle, not elsewhere classified  Localized edema  Muscle weakness (generalized)  Difficulty in walking, not elsewhere classified     Problem List Patient Active Problem List   Diagnosis Date Noted  . Closed disp fracture of left lateral malleolus with routine healing 08/23/2016    HARRIS,KAREN PTA 10/02/2016, 2:57 PM  [facility] [address] [place], [place], [address] Phone: [phone]   Fax:  [phone]  Name: Teresa Collier MRN: [id number] Date of Birth: 01/05/1951

## 2016-10-04 ENCOUNTER — Ambulatory Visit (INDEPENDENT_AMBULATORY_CARE_PROVIDER_SITE_OTHER): Payer: Self-pay | Admitting: Orthopaedic Surgery

## 2016-10-04 ENCOUNTER — Ambulatory Visit (INDEPENDENT_AMBULATORY_CARE_PROVIDER_SITE_OTHER): Payer: Self-pay

## 2016-10-04 ENCOUNTER — Encounter (INDEPENDENT_AMBULATORY_CARE_PROVIDER_SITE_OTHER): Payer: Self-pay | Admitting: Orthopaedic Surgery

## 2016-10-04 DIAGNOSIS — S8262XD Displaced fracture of lateral malleolus of left fibula, subsequent encounter for closed fracture with routine healing: Secondary | ICD-10-CM

## 2016-10-04 NOTE — Progress Notes (Signed)
Patient is 10 weeks status post ORIF left lateral malleolus fracture and Lisfranc fracture. She is doing much better now she is ambulating with a fracture boot. She is doing physical therapy twice a week and we'll like to wean the cam boot. She has mild pain at night. Physical exam the scars are all well-healed. She has no swelling. No signs of infection. X-ray show healed fractures with stable fixation. Plan is to wean the Cam Walker and continue with physical therapy to the end of this month follow up with me as needed she has reached MMI

## 2016-10-05 ENCOUNTER — Ambulatory Visit: Payer: Self-pay

## 2016-10-05 DIAGNOSIS — S82842A Displaced bimalleolar fracture of left lower leg, initial encounter for closed fracture: Secondary | ICD-10-CM

## 2016-10-05 DIAGNOSIS — R262 Difficulty in walking, not elsewhere classified: Secondary | ICD-10-CM

## 2016-10-05 DIAGNOSIS — M25672 Stiffness of left ankle, not elsewhere classified: Secondary | ICD-10-CM

## 2016-10-05 DIAGNOSIS — R600 Localized edema: Secondary | ICD-10-CM

## 2016-10-05 DIAGNOSIS — M6281 Muscle weakness (generalized): Secondary | ICD-10-CM

## 2016-10-05 NOTE — Therapy (Signed)
[place], [place], [address] Phone: [phone]   Fax:  [phone]  Physical Therapy Treatment  Patient Details  Name: Teresa Collier MRN: [id number] Date of Birth: [birth year] Referring Provider: Frankey Shown MD  Encounter Date: 10/05/2016      PT End of Session - 10/05/16 0805    Visit Number 8   Number of Visits 16   Date for PT Re-Evaluation 10/26/16   PT Start Time 0800   PT Stop Time 0845   PT Time Calculation (min) 45 min   Activity Tolerance Patient tolerated treatment well   Behavior During Therapy [facility] for tasks assessed/performed      Past Medical History:  Diagnosis Date  . Hyperlipidemia   . Hypertension     Past Surgical History:  Procedure Laterality Date  . APPENDECTOMY    . BUNIONECTOMY    . OPEN REDUCTION INTERNAL FIXATION (ORIF) FOOT LISFRANC FRACTURE Left 07/25/2016   Procedure: OPEN REDUCTION INTERNAL FIXATION (ORIF) FOOT LISFRANC FRACTURE;  Surgeon: Leandrew Koyanagi, MD;  Location: [place];  Service: Orthopedics;  Laterality: Left;  . ORIF ANKLE FRACTURE Left 07/25/2016   Procedure: OPEN REDUCTION INTERNAL FIXATION (ORIF) LEFT BIMALLEOLAR  ANKLE AND LISFRANC FRACTURES;  Surgeon: Leandrew Koyanagi, MD;  Location: [place];  Service: Orthopedics;  Laterality: Left;    There were no vitals filed for this visit.      Subjective Assessment - 10/05/16 6144    Subjective MD said everything looked good . Follow with him if needed in future . No visit  scheduled. Mild pain and wore shoe today   Pain Score --  mild                         [facility] Adult PT Treatment/Exercise - 10/05/16 0806      Ambulation/Gait   Assistive device Straight cane   Gait Pattern Step-through pattern   Ambulation Surface Level;Indoor   Gait Comments decr weight to LT/     Balance   Balance Assessed No     High Level Balance   High Level Balance  Activities Side stepping   High Level Balance Comments cross over RT and Lt  walking heel and toe side steps with weight shift RT and LT      Manual Therapy   Passive ROM PF stretching 30 sec x 5     Ankle Exercises: Aerobic   Stationary Bike Nustep L5 LE  5 min     Ankle Exercises: Standing   Heel Raises 15 reps   Toe Raise 15 reps   Tai Chi side steps with delaye weight shift RT and LT x 20 cued to      Ankle Exercises: Stretches   Soleus Stretch 3 reps;30 seconds   Gastroc Stretch 3 reps;30 seconds     Ankle Exercises: Seated   Other Seated Ankle Exercises on heel with active DF/PF/IV/EV stretching                 PT Education - 10/05/16 0846    Education provided Yes   Education Details asked to apply CAM boot if ankle gets sore with weight beariing or get off feet.    Person(s) Educated Patient   Methods Explanation   Comprehension Verbalized understanding          PT Short Term Goals - 10/02/16 0837      PT SHORT TERM GOAL #  1   Title Independent with inital HEP   Baseline independent   Time 3   Period Weeks   Status Achieved     PT SHORT TERM GOAL #2   Title She will walk full time without assitive device in and out of home   Baseline uses cane to help with pain and balance   Time 4   Period Weeks   Status On-going     PT SHORT TERM GOAL #3   Title Pain level will remain mild 1-3/10   Baseline up to 4-5/10 nat end of the day.   Time 4     PT SHORT TERM GOAL #4   Title DF wil incr to 102 degrees LT to facilitae walking out of  CAM walker    Baseline 96   Time 4   Period Weeks   Status On-going           PT Long Term Goals - 10/05/16 0848      PT LONG TERM GOAL #1   Title She will be independent with all HEP issued as of last visit   Status On-going     PT LONG TERM GOAL #2   Title She will have intermittant mild pain after walking  to do normal shopping tasks   Status On-going     PT LONG TERM GOAL #3   Title She will be able  to walk steps step over step with one rail safely   Status On-going     PT LONG TERM GOAL #4   Title she will be able to stand single leg for at least 10 seconds to improve safety with walking   Status On-going     PT LONG TERM GOAL #5   Title She will return to normal home tasks with 1-2/10 pain   Status On-going               Plan - 10/05/16 0758    Clinical Impression Statement She is doing very well on feet with balance and weight o LT leg though cont to use cane and decr weight when doing normal walking. Progressing well/   PT Treatment/Interventions Cryotherapy;Electrical Stimulation;Moist Heat;Vasopneumatic Device;Taping;Manual techniques;Passive range of motion;Patient/family education;Balance training;Therapeutic exercise;Gait training;Stair training   PT Home Exercise Plan AROM exer and DF stretching, Red band ankle strength 4 way, standing DF stretch, weight shifting RT to TL with stepping with slight splay feet to decr pressure to big toe   Consulted and Agree with Plan of Care Patient      Patient will benefit from skilled therapeutic intervention in order to improve the following deficits and impairments:  Pain, Increased edema, Decreased strength, Decreased activity tolerance, Difficulty walking, Decreased range of motion  Visit Diagnosis: Closed bimalleolar fracture of left ankle, initial encounter  Stiffness of left ankle, not elsewhere classified  Localized edema  Muscle weakness (generalized)  Difficulty in walking, not elsewhere classified     Problem List Patient Active Problem List   Diagnosis Date Noted  . Closed disp fracture of left lateral malleolus with routine healing 08/23/2016    Darrel Hoover  PT 10/05/2016, 8:49 AM  [facility] [address] [place], [place], [address] Phone: [phone]   Fax:  [phone]  Name: Teresa Collier MRN: [id number] Date of Birth:  12-Dec-1950

## 2016-10-09 ENCOUNTER — Ambulatory Visit: Payer: Self-pay | Attending: Internal Medicine

## 2016-10-09 ENCOUNTER — Ambulatory Visit: Payer: Self-pay | Admitting: Physical Therapy

## 2016-10-09 DIAGNOSIS — R262 Difficulty in walking, not elsewhere classified: Secondary | ICD-10-CM

## 2016-10-09 DIAGNOSIS — R600 Localized edema: Secondary | ICD-10-CM

## 2016-10-09 DIAGNOSIS — S82842A Displaced bimalleolar fracture of left lower leg, initial encounter for closed fracture: Secondary | ICD-10-CM

## 2016-10-09 DIAGNOSIS — M25672 Stiffness of left ankle, not elsewhere classified: Secondary | ICD-10-CM

## 2016-10-09 DIAGNOSIS — M6281 Muscle weakness (generalized): Secondary | ICD-10-CM

## 2016-10-09 NOTE — Therapy (Signed)
[place], [place], [address] Phone: [phone]   Fax:  [phone]  Physical Therapy Treatment  Patient Details  Name: Teresa Collier MRN: [id number] Date of Birth: 05-Jul-1951 Referring Provider: Frankey Shown MD  Encounter Date: 10/09/2016      PT End of Session - 10/09/16 1327    Visit Number 9   Number of Visits 16   Date for PT Re-Evaluation 10/26/16   PT Start Time 0803   PT Stop Time 0845   PT Time Calculation (min) 42 min   Activity Tolerance Patient tolerated treatment well   Behavior During Therapy [facility] for tasks assessed/performed      Past Medical History:  Diagnosis Date  . Hyperlipidemia   . Hypertension     Past Surgical History:  Procedure Laterality Date  . APPENDECTOMY    . BUNIONECTOMY    . OPEN REDUCTION INTERNAL FIXATION (ORIF) FOOT LISFRANC FRACTURE Left 07/25/2016   Procedure: OPEN REDUCTION INTERNAL FIXATION (ORIF) FOOT LISFRANC FRACTURE;  Surgeon: Leandrew Koyanagi, MD;  Location: [place];  Service: Orthopedics;  Laterality: Left;  . ORIF ANKLE FRACTURE Left 07/25/2016   Procedure: OPEN REDUCTION INTERNAL FIXATION (ORIF) LEFT BIMALLEOLAR  ANKLE AND LISFRANC FRACTURES;  Surgeon: Leandrew Koyanagi, MD;  Location: [place];  Service: Orthopedics;  Laterality: Left;    There were no vitals filed for this visit.      Subjective Assessment - 10/09/16 0841    Subjective I am having mild pain only.  I  quit the boot cold [place]!! I was so busy over the weekend I did not get a chance to do the exercises.  I want to work on walking today.   Currently in Pain? Yes   Pain Score --  mild   Pain Location Ankle   Pain Orientation Left;Anterior   Pain Descriptors / Indicators Burning;Sharp   Pain Frequency Intermittent   Aggravating Factors  end of day, and intermittantly    Pain Relieving Factors rest elevation   Effect of Pain on Daily Activities slow  gait                         [facility] Adult PT Treatment/Exercise - 10/09/16 0001      Manual Therapy   Manual therapy comments mobilization with movement to ankle joint grade 2 to assist DF wiile stretching in prostretch.     Ankle Exercises: Stretches   Other Stretch prostretch 4 minutes with intermittant manual     Ankle Exercises: Seated   Heel Raises 10 reps  cues for more great toe pressure      Ankle Exercises: Supine   T-Band red band 3 way 2 sets of 10     Ankle Exercises: Standing   Heel Raises 10 reps  on step dropping  heels and lifting as high as she can   Other Standing Ankle Exercises gait training in , out of parallel bars,  in clinic 100+ feet.  She was able to decrease pain with instruction and follow through.                PT Education - 10/09/16 1327    Education provided Yes   Education Details gait training   Person(s) Educated Patient   Methods Explanation;Demonstration;Verbal cues   Comprehension Verbalized understanding;Returned demonstration          PT Short Term Goals - 10/09/16 1437  PT SHORT TERM GOAL #1   Title Independent with inital HEP   Status Achieved     PT SHORT TERM GOAL #2   Title She will walk full time without assitive device in and out of home   Baseline No boot in and out of home, full time. cane intermittantly   Time 4   Period Weeks   Status On-going     PT SHORT TERM GOAL #3   Title Pain level will remain mild 1-3/10   Baseline 3/10 today, not sure if consistant   Time 4   Period Weeks   Status Partially Met     PT SHORT TERM GOAL #4   Title DF wil incr to 102 degrees LT to facilitae walking out of  CAM walker    Time 4   Period Weeks   Status Unable to assess           PT Long Term Goals - 10/05/16 0848      PT LONG TERM GOAL #1   Title She will be independent with all HEP issued as of last visit   Status On-going     PT LONG TERM GOAL #2   Title She will have  intermittant mild pain after walking  to do normal shopping tasks   Status On-going     PT LONG TERM GOAL #3   Title She will be able to walk steps step over step with one rail safely   Status On-going     PT LONG TERM GOAL #4   Title she will be able to stand single leg for at least 10 seconds to improve safety with walking   Status On-going     PT LONG TERM GOAL #5   Title She will return to normal home tasks with 1-2/10 pain   Status On-going               Plan - 10/09/16 1328    Clinical Impression Statement Patient able to walk without foot pain after session today.  Strength, stretch and gait focus.  Patrient has been able to wean from her boot.     PT Next Visit Plan   FOTO if not done earlier. continue gait training,  closed chain. stretch as able   PT Home Exercise Plan AROM exer and DF stretching, Red band ankle strength 4 way, standing DF stretch, weight shifting RT to TL with stepping with slight splay feet to decr pressure to big toe   Consulted and Agree with Plan of Care Patient      Patient will benefit from skilled therapeutic intervention in order to improve the following deficits and impairments:  Pain, Increased edema, Decreased strength, Decreased activity tolerance, Difficulty walking, Decreased range of motion  Visit Diagnosis: Closed bimalleolar fracture of left ankle, initial encounter  Stiffness of left ankle, not elsewhere classified  Localized edema  Muscle weakness (generalized)  Difficulty in walking, not elsewhere classified     Problem List Patient Active Problem List   Diagnosis Date Noted  . Closed disp fracture of left lateral malleolus with routine healing 08/23/2016    HARRIS,KAREN PTA 10/09/2016, 2:39 PM  [facility] [address] [place], [place], [address] Phone: [phone]   Fax:  [phone]  Name: Teresa Collier MRN: [id number] Date of Birth:  [birth year]

## 2016-10-11 ENCOUNTER — Ambulatory Visit: Payer: Self-pay | Admitting: Physical Therapy

## 2016-10-16 ENCOUNTER — Ambulatory Visit: Payer: Self-pay

## 2016-10-16 ENCOUNTER — Encounter: Payer: Self-pay | Admitting: Family Medicine

## 2016-10-16 ENCOUNTER — Ambulatory Visit (INDEPENDENT_AMBULATORY_CARE_PROVIDER_SITE_OTHER): Payer: Self-pay | Admitting: Family Medicine

## 2016-10-16 ENCOUNTER — Other Ambulatory Visit (HOSPITAL_COMMUNITY)
Admission: RE | Admit: 2016-10-16 | Discharge: 2016-10-16 | Disposition: A | Discharge status: Home / Self Care | Payer: Self-pay | Source: Ambulatory Visit | Attending: Family Medicine | Admitting: Family Medicine

## 2016-10-16 VITALS — BP 128/63 | HR 91 | Temp 98.7°F | Resp 18 | Ht 64.0 in | Wt 177.0 lb

## 2016-10-16 DIAGNOSIS — R600 Localized edema: Secondary | ICD-10-CM

## 2016-10-16 DIAGNOSIS — R262 Difficulty in walking, not elsewhere classified: Secondary | ICD-10-CM

## 2016-10-16 DIAGNOSIS — E785 Hyperlipidemia, unspecified: Secondary | ICD-10-CM

## 2016-10-16 DIAGNOSIS — Z113 Encounter for screening for infections with a predominantly sexual mode of transmission: Secondary | ICD-10-CM | POA: Insufficient documentation

## 2016-10-16 DIAGNOSIS — Z1211 Encounter for screening for malignant neoplasm of colon: Secondary | ICD-10-CM

## 2016-10-16 DIAGNOSIS — R82998 Other abnormal findings in urine: Secondary | ICD-10-CM

## 2016-10-16 DIAGNOSIS — M6281 Muscle weakness (generalized): Secondary | ICD-10-CM

## 2016-10-16 DIAGNOSIS — S82842A Displaced bimalleolar fracture of left lower leg, initial encounter for closed fracture: Secondary | ICD-10-CM

## 2016-10-16 DIAGNOSIS — M25672 Stiffness of left ankle, not elsewhere classified: Secondary | ICD-10-CM

## 2016-10-16 DIAGNOSIS — I10 Essential (primary) hypertension: Secondary | ICD-10-CM | POA: Insufficient documentation

## 2016-10-16 DIAGNOSIS — Z01419 Encounter for gynecological examination (general) (routine) without abnormal findings: Secondary | ICD-10-CM

## 2016-10-16 DIAGNOSIS — R8299 Other abnormal findings in urine: Secondary | ICD-10-CM

## 2016-10-16 LAB — POC HEMOCCULT BLD/STL (HOME/3-CARD/SCREEN)
Card #3 Fecal Occult Blood, POC: POSITIVE
FECAL OCCULT BLD: POSITIVE — AB

## 2016-10-16 LAB — POCT URINALYSIS DIP (DEVICE)
BILIRUBIN URINE: NEGATIVE
GLUCOSE, UA: NEGATIVE mg/dL
Hgb urine dipstick: NEGATIVE
KETONES UR: NEGATIVE mg/dL
NITRITE: NEGATIVE
PH: 5.5 (ref 5.0–8.0)
Protein, ur: NEGATIVE mg/dL
Specific Gravity, Urine: 1.025 (ref 1.005–1.030)
Urobilinogen, UA: 2 mg/dL — ABNORMAL HIGH (ref 0.0–1.0)

## 2016-10-16 NOTE — Patient Instructions (Addendum)
Dyslipidemia Dyslipidemia is an imbalance of waxy, fat-like substances (lipids) in the blood. The body needs lipids in small amounts. Dyslipidemia often involves a high level of cholesterol or triglycerides, which are types of lipids. Common forms of dyslipidemia include:  High levels of bad cholesterol (LDL cholesterol). LDL is the type of cholesterol that causes fatty deposits (plaques) to build up in the blood vessels that carry blood away from your heart (arteries).  Low levels of good cholesterol (HDL cholesterol). HDL cholesterol is the type of cholesterol that protects against heart disease. High levels of HDL remove the LDL buildup from arteries.  High levels of triglycerides. Triglycerides are a fatty substance in the blood that is linked to a buildup of plaques in the arteries. You can develop dyslipidemia because of the genes you are born with (primary dyslipidemia) or changes that occur during your life (secondary dyslipidemia), or as a side effect of certain medical treatments. What are the causes? Primary dyslipidemia is caused by changes (mutations) in genes that are passed down through families (inherited). These mutations cause several types of dyslipidemia. Mutations can result in disorders that make the body produce too much LDL cholesterol or triglycerides, or not enough HDL cholesterol. These disorders may lead to heart disease, arterial disease, or stroke at an early age. Causes of secondary dyslipidemia include certain lifestyle choices and diseases that lead to dyslipidemia, such as:  Eating a diet that is high in animal fat.  Not getting enough activity or exercise (having a sedentary lifestyle).  Having diabetes, kidney disease, liver disease, or thyroid disease.  Drinking large amounts of alcohol.  Using certain types of drugs. What increases the risk? You may be at greater risk for dyslipidemia if you are an older man or if you are a woman who has gone through  menopause. Other risk factors include:  Having a family history of dyslipidemia.  Taking certain medicines, including birth control pills, steroids, some diuretics, beta-blockers, and some medicines forHIV.  Smoking cigarettes.  Eating a high-fat diet.  Drinking large amounts of alcohol.  Having certain medical conditions such as diabetes, polycystic ovary syndrome (PCOS), pregnancy, kidney disease, liver disease, or hypothyroidism.  Not exercising regularly.  Being overweight or obese with too much belly fat. What are the signs or symptoms? Dyslipidemia does not usually cause any symptoms. Very high lipid levels can cause fatty bumps under the skin (xanthomas) or a white or gray ring around the black center (pupil) of the eye. Very high triglyceride levels can cause inflammation of the pancreas (pancreatitis). How is this diagnosed? Your health care provider may diagnose dyslipidemia based on a routine blood test (fasting blood test). Because most people do not have symptoms of the condition, this blood testing (lipid profile) is done on adults age 47 and older and is repeated every 5 years. This test checks:  Total cholesterol. This is a measure of the total amount of cholesterol in your blood, including LDL cholesterol, HDL cholesterol, and triglycerides. A healthy number is below 200.  LDL cholesterol. The target number for LDL cholesterol is different for each person, depending on individual risk factors. For most people, a number below 100 is healthy. Ask your health care provider what your LDL cholesterol number should be.  HDL cholesterol. An HDL level of 60 or higher is best because it helps to protect against heart disease. A number below 68 for men or below 69 for women increases the risk for heart disease.  Triglycerides. A healthy triglyceride number  is below 150. If your lipid profile is abnormal, your health care provider may do other blood tests to get more information  about your condition. How is this treated? Treatment depends on the type of dyslipidemia that you have and your other risk factors for heart disease and stroke. Your health care provider will have a target range for your lipid levels based on this information. For many people, treatment starts with lifestyle changes, such as diet and exercise. Your health care provider may recommend that you:  Get regular exercise.  Make changes to your diet.  Quit smoking if you smoke. If diet changes and exercise do not help you reach your goals, your health care provider may also prescribe medicine to lower lipids. The most commonly prescribed type of medicine lowers your LDL cholesterol (statin drug). If you have a high triglyceride level, your provider may prescribe another type of drug (fibrate) or an omega-3 fish oil supplement, or both. Follow these instructions at home:  Take over-the-counter and prescription medicines only as told by your health care provider. This includes supplements.  Get regular exercise. Start an aerobic exercise and strength training program as told by your health care provider. Ask your health care provider what activities are safe for you. Your health care provider may recommend:  30 minutes of aerobic activity 4-6 days a week. Brisk walking is an example of aerobic activity.  Strength training 2 days a week.  Eat a healthy diet as told by your health care provider. This can help you reach and maintain a healthy weight, lower your LDL cholesterol, and raise your HDL cholesterol. It may help to work with a diet and nutrition specialist (dietitian) to make a plan that is right for you. Your dietitian or health care provider may recommend:  Limiting your calories, if you are overweight.  Eating more fruits, vegetables, whole grains, fish, and lean meats.  Limiting saturated fat, trans fat, and cholesterol.  Follow instructions from your health care provider or dietitian  about eating or drinking restrictions.  Limit alcohol intake to no more than one drink per day for nonpregnant women and two drinks per day for men. One drink equals 12 oz of beer, 5 oz of wine, or 1 oz of hard liquor.  Do not use any products that contain nicotine or tobacco, such as cigarettes and e-cigarettes. If you need help quitting, ask your health care provider.  Keep all follow-up visits as told by your health care provider. This is important. Contact a health care provider if:  You are having trouble sticking to your exercise or diet plan.  You are struggling to quit smoking or control your use of alcohol. Summary  Dyslipidemia is an imbalance of waxy, fat-like substances (lipids) in the blood. The body needs lipids in small amounts. Dyslipidemia often involves a high level of cholesterol or triglycerides, which are types of lipids.  Treatment depends on the type of dyslipidemia that you have and your other risk factors for heart disease and stroke.  For many people, treatment starts with lifestyle changes, such as diet and exercise. Your health care provider may also prescribe medicine to lower lipids. This information is not intended to replace advice given to you by your health care provider. Make sure you discuss any questions you have with your health care provider. Document Released: 10/20/2013 Document Revised: 06/11/2016 Document Reviewed: 06/11/2016 Elsevier Interactive Patient Education  2017 [place].  Hypertension Hypertension is another name for high blood pressure. High  blood pressure forces your heart to work harder to pump blood. A blood pressure reading has two numbers, which includes a higher number over a lower number (example: 110/72). Follow these instructions at home:  Have your blood pressure rechecked by your doctor.  Only take medicine as told by your doctor. Follow the directions carefully. The medicine does not work as well if you skip doses.  Skipping doses also puts you at risk for problems.  Do not smoke.  Monitor your blood pressure at home as told by your doctor. Contact a doctor if:  You think you are having a reaction to the medicine you are taking.  You have repeat headaches or feel dizzy.  You have puffiness (swelling) in your ankles.  You have trouble with your vision. Get help right away if:  You get a very bad headache and are confused.  You feel weak, numb, or faint.  You get chest or belly (abdominal) pain.  You throw up (vomit).  You cannot breathe very well. This information is not intended to replace advice given to you by your health care provider. Make sure you discuss any questions you have with your health care provider. Document Released: 04/02/2008 Document Revised: 03/22/2016 Document Reviewed: 08/07/2013 Elsevier Interactive Patient Education  2017 [employer].

## 2016-10-16 NOTE — Therapy (Addendum)
[place], [place], [address] Phone: [phone]   Fax:  [phone]  Physical Therapy Treatment/Discharge  Patient Details  Name: Teresa Collier MRN: [id number] Date of Birth: [birth year] Referring Provider: Frankey Shown MD  Encounter Date: 10/16/2016      PT End of Session - 10/16/16 0756    Visit Number 10   Number of Visits 18   Date for PT Re-Evaluation 11/09/16   Authorization Type CAFA   PT Start Time 0754   PT Stop Time 0840   PT Time Calculation (min) 46 min   Activity Tolerance Patient tolerated treatment well;No increased pain   Behavior During Therapy WFL for tasks assessed/performed      Past Medical History:  Diagnosis Date  . Hyperlipidemia   . Hypertension     Past Surgical History:  Procedure Laterality Date  . APPENDECTOMY    . BUNIONECTOMY    . OPEN REDUCTION INTERNAL FIXATION (ORIF) FOOT LISFRANC FRACTURE Left 07/25/2016   Procedure: OPEN REDUCTION INTERNAL FIXATION (ORIF) FOOT LISFRANC FRACTURE;  Surgeon: Leandrew Koyanagi, MD;  Location: [facility];  Service: Orthopedics;  Laterality: Left;  . ORIF ANKLE FRACTURE Left 07/25/2016   Procedure: OPEN REDUCTION INTERNAL FIXATION (ORIF) LEFT BIMALLEOLAR  ANKLE AND LISFRANC FRACTURES;  Surgeon: Leandrew Koyanagi, MD;  Location: [facility];  Service: Orthopedics;  Laterality: Left;    There were no vitals filed for this visit.      Subjective Assessment - 10/16/16 0803    Subjective Trying to walk better. ( She looks better with walking incr weight )   Currently in Pain? Yes   Pain Score --  mild   Pain Location Ankle   Pain Orientation Left;Anterior   Pain Descriptors / Indicators Burning;Sharp   Pain Type Chronic pain   Pain Onset More than a month ago   Pain Frequency Intermittent   Aggravating Factors  end of day worse   Pain Relieving Factors rest elevation   Multiple Pain Sites No             [facility] PT Assessment - 10/16/16 0001      AROM   Left Ankle Dorsiflexion 93   Left Ankle Plantar Flexion 35   Left Ankle Inversion 20   Left Ankle Eversion 12     PROM   Left Ankle Dorsiflexion 99   Left Ankle Plantar Flexion 32   Left Ankle Inversion 30   Left Ankle Eversion 23     Strength   Left Ankle Dorsiflexion 5/5   Left Ankle Plantar Flexion 3+/5   Left Ankle Inversion 5/5   Left Ankle Eversion 5/5                     [facility] Adult PT Treatment/Exercise - 10/16/16 0001      Ambulation/Gait   Assistive device None   Gait Pattern Step-through pattern   Ambulation Surface Level;Indoor     Neuro Re-ed    Neuro Re-ed Details  single leg stance  practice RT and LT as both decr from norm. Cued to have weight to lateral foot and time incr 2 sec LT and RT.      Manual Therapy   Manual therapy comments mobilization with movement to ankle joint grade 4 to assist DF while stretching on step    Joint Mobilization AP and PA glides Gr 3-4 for DF and PF/IN/EV  LT ankle    Passive ROM  PF/ DF stretching 30 sec x 5     Ankle Exercises: Stretches   Soleus Stretch 3 reps;30 seconds   Gastroc Stretch 3 reps;30 seconds   Slant Board Stretch 3 reps;30 seconds     Ankle Exercises: Standing   Other Standing Ankle Exercises marching with slow knee reiases for balance and strength.                    PT Short Term Goals - 10/16/16 0818      PT SHORT TERM GOAL #1   Title Independent with inital HEP     PT SHORT TERM GOAL #2   Title She will walk full time without assitive device in and out of home   Baseline No boot in and out of home, full time. cane intermittantly   Status Partially Met     PT SHORT TERM GOAL #3   Title Pain level will remain mild 1-3/10   Status Achieved     PT SHORT TERM GOAL #4   Title DF wil incr to 102 degrees LT to facilitae walking out of  CAM walker    Status On-going           PT Long Term Goals - 10/16/16 0819       PT LONG TERM GOAL #1   Title She will be independent with all HEP issued as of last visit   Status On-going     PT LONG TERM GOAL #2   Title She will have intermittant mild pain after walking  to do normal shopping tasks   Status On-going     PT LONG TERM GOAL #3   Title She will be able to walk steps step over step with one rail safely   Baseline decr control on descent but able to do without LOB   Status Partially Met     PT LONG TERM GOAL #4   Title she will be able to stand single leg for at least 10 seconds to improve safety with walking   Baseline 3 sec max today   Status On-going     PT LONG TERM GOAL #5   Title She will return to normal home tasks with 1-2/10 pain   Status On-going               Plan - 10/16/16 0757    Clinical Impression Statement She is walking smoother but ROM limits makes stairs and squatting difficult . She is walking without cane 95% of time and swelling only at distal digit 1-2.  Declined mdalities  Goos progress but may need more sessions to max ROM to improve function on feet.    PT Treatment/Interventions Cryotherapy;Electrical Stimulation;Moist Heat;Vasopneumatic Device;Taping;Manual techniques;Passive range of motion;Patient/family education;Balance training;Therapeutic exercise;Gait training;Stair training   PT Next Visit Plan Continue manual and balance and stretching , gait   PT Home Exercise Plan AROM exer and DF stretching, Red band ankle strength 4 way, standing DF stretch, weight shifting RT to TL with stepping with slight splay feet to decr pressure to big toe   Consulted and Agree with Plan of Care Patient      Patient will benefit from skilled therapeutic intervention in order to improve the following deficits and impairments:  Pain, Increased edema, Decreased strength, Decreased activity tolerance, Difficulty walking, Decreased range of motion  Visit Diagnosis: Closed bimalleolar fracture of left ankle, initial encounter -  Plan: PT plan of care cert/re-cert  Stiffness of left ankle, not elsewhere classified -  Plan: PT plan of care cert/re-cert  Localized edema - Plan: PT plan of care cert/re-cert  Muscle weakness (generalized) - Plan: PT plan of care cert/re-cert  Difficulty in walking, not elsewhere classified - Plan: PT plan of care cert/re-cert     Problem List Patient Active Problem List   Diagnosis Date Noted  . Closed disp fracture of left lateral malleolus with routine healing 08/23/2016    Darrel Hoover  PT 10/16/2016, 8:49 AM  [facility] [address] [place], [place], [address] Phone: [phone]   Fax:  [phone]  Name: Teresa Collier MRN: [id number] Date of Birth: [birth year]   PHYSICAL THERAPY DISCHARGE SUMMARY  Visits from Start of Care: 10  Current functional level related to goals / functional outcomes: Unknown as she canceled 4 appointments for family emergency and has not returned. We would be happy to resume PT if warented. She will need a new order for PT    Remaining deficits: Unknown    Education / Equipment: HEP Plan:                                                    Patient goals were partially met. Patient is being discharged due to not returning since the last visit.  ?????   Lillette Boxer Adair Lemar PT  11/27/16    8:39 AM

## 2016-10-16 NOTE — Progress Notes (Signed)
Subjective:    Patient ID: Teresa Collier, female    DOB: [birth year], 65 y.o.   MRN: [id number]  Gynecologic Exam  The patient's pertinent negatives include no genital itching, genital lesions, genital odor, genital rash, missed menses, pelvic pain, vaginal bleeding or vaginal discharge. Pertinent negatives include no abdominal pain, anorexia, back pain, chills, constipation, diarrhea, discolored urine, dysuria, fever, headaches, joint swelling or nausea. She is not sexually active.  Hypertension  Pertinent negatives include no anxiety, blurred vision, headaches, orthopnea, palpitations or shortness of breath. Risk factors for coronary artery disease include sedentary lifestyle. Past treatments include diuretics and ACE inhibitors. There are no compliance problems.  There is no history of angina, kidney disease, CAD/MI, heart failure, left ventricular hypertrophy, PVD, renovascular disease or a thyroid problem. There is no history of chronic renal disease, coarctation of the aorta, hyperaldosteronism, hypercortisolism, hyperparathyroidism, a hypertension causing med, pheochromocytoma or sleep apnea.    Past Medical History:  Diagnosis Date  . Hyperlipidemia   . Hypertension    Social History   Social History Narrative  . No narrative on file   Immunization History  Administered Date(s) Administered  . Pneumococcal Conjugate-13 09/24/2016  . Tdap 09/24/2016   Review of Systems  Constitutional: Negative.  Negative for chills and fever.  HENT: Negative.   Eyes: Negative.  Negative for blurred vision.  Respiratory: Negative.  Negative for chest tightness and shortness of breath.   Cardiovascular: Negative.  Negative for palpitations, orthopnea and leg swelling.  Gastrointestinal: Negative.  Negative for abdominal pain, anorexia, constipation, diarrhea and nausea.  Endocrine: Negative.  Negative for polydipsia, polyphagia and polyuria.  Genitourinary: Negative.  Negative for dysuria,  missed menses, pelvic pain and vaginal discharge.  Musculoskeletal: Negative.  Negative for back pain.  Skin: Negative.   Allergic/Immunologic: Negative.  Negative for immunocompromised state.  Neurological: Negative.  Negative for headaches.  Hematological: Negative.   Psychiatric/Behavioral: Negative.       Objective:   Physical Exam  Constitutional: She appears well-developed and well-nourished.  HENT:  Head: Normocephalic and atraumatic.  Right Ear: External ear normal.  Left Ear: External ear normal.  Nose: Nose normal.  Mouth/Throat: Oropharynx is clear and moist.  Eyes: Conjunctivae and EOM are normal. Pupils are equal, round, and reactive to light.  Neck: Normal range of motion. Neck supple.  Abdominal: Soft. Bowel sounds are normal.  Genitourinary: There is no tenderness or lesion on the right labia. There is no tenderness or lesion on the left labia. Cervix exhibits discharge. Cervix exhibits no motion tenderness and no friability. No tenderness in the vagina. No vaginal discharge found.      BP 128/63 (BP Location: Left Arm, Patient Position: Sitting, Cuff Size: Normal)   Pulse 91   Temp 98.7 F (37.1 C) (Oral)   Resp 18   Ht 5\' 4"  (1.626 m)   Wt 177 lb (80.3 kg)   SpO2 100%   BMI 30.38 kg/m  Assessment & Plan:  1. Encounter for well woman exam with routine gynecological exam - Cytology - PAP [place] - POC Hemoccult Bld/Stl (3-Cd Home Screen)  2. Essential hypertension Blood pressure is at goal on current medication regimen.  - POCT urinalysis dip (device)  3. Hyperlipidemia, unspecified hyperlipidemia type Will continue statin therapy at current dosage. The patient is asked to make an attempt to improve diet and exercise patterns to aid in medical management of this problem.  4. Urine leukocytes - Urine culture  5. Colon cancer screening - Ambulatory  referral to Gastroenterology   RTC: 3 months for hypertension Hollis,Lachina M, FNP   The  patient was given clear instructions to go to ER or return to medical center if symptoms do not improve, worsen or new problems develop. The patient verbalized understanding. Will notify patient with laboratory results.

## 2016-10-16 NOTE — Progress Notes (Signed)
Patient is here for PAP. Patient denies pain at this time.  Patient has taken medication today. Patient has eaten today.

## 2016-10-17 ENCOUNTER — Other Ambulatory Visit: Payer: Self-pay | Admitting: Family Medicine

## 2016-10-17 LAB — CYTOLOGY - PAP
CHLAMYDIA, DNA PROBE: NEGATIVE
Diagnosis: NEGATIVE
NEISSERIA GONORRHEA: NEGATIVE

## 2016-10-17 LAB — URINE CULTURE

## 2016-10-18 ENCOUNTER — Encounter: Payer: Self-pay | Admitting: Gastroenterology

## 2016-10-18 ENCOUNTER — Ambulatory Visit: Payer: Self-pay | Admitting: Physical Therapy

## 2016-10-18 ENCOUNTER — Other Ambulatory Visit: Payer: Self-pay | Admitting: Family Medicine

## 2016-10-18 ENCOUNTER — Telehealth: Payer: Self-pay

## 2016-10-18 DIAGNOSIS — B952 Enterococcus as the cause of diseases classified elsewhere: Secondary | ICD-10-CM

## 2016-10-18 DIAGNOSIS — N390 Urinary tract infection, site not specified: Principal | ICD-10-CM

## 2016-10-18 MED ORDER — AMOXICILLIN 500 MG PO CAPS: 500.0000 mg | ORAL_CAPSULE | Freq: Two times a day (BID) | ORAL | 0 refills | Status: DC

## 2016-10-18 MED FILL — AMOXICILLIN 500 MG CAPSULE: 500 | 10 days supply | Qty: 20 | Fill #0

## 2016-10-18 NOTE — Progress Notes (Signed)
Called and left message advising that urine showed infection and she needs to start amoxicillin for this as prescribed [facility] Advised that Pap was normal and only needs to be repeated in 3 years. Asked if any other questions to call back. Thanks!

## 2016-10-18 NOTE — Telephone Encounter (Signed)
See previous note. I have called and left message for patient. Thanks!

## 2016-10-18 NOTE — Progress Notes (Signed)
Meds ordered this encounter  Medications  . amoxicillin (AMOXIL) 500 MG capsule    Sig: Take 1 capsule (500 mg total) by mouth 2 (two) times daily.    Dispense:  20 capsule    Refill:  0

## 2016-10-19 LAB — CERVICOVAGINAL ANCILLARY ONLY
BACTERIAL VAGINITIS: NEGATIVE
CANDIDA VAGINITIS: NEGATIVE

## 2016-10-24 ENCOUNTER — Ambulatory Visit: Payer: Self-pay | Admitting: Physical Therapy

## 2016-10-26 ENCOUNTER — Ambulatory Visit: Payer: Self-pay

## 2016-11-13 ENCOUNTER — Ambulatory Visit
Admission: RE | Admit: 2016-11-13 | Discharge: 2016-11-13 | Disposition: A | Discharge status: Home / Self Care | Payer: Medicaid Other | Source: Ambulatory Visit | Attending: Family Medicine | Admitting: Family Medicine

## 2016-11-13 DIAGNOSIS — Z1239 Encounter for other screening for malignant neoplasm of breast: Secondary | ICD-10-CM

## 2016-11-19 MED FILL — HYDROCHLOROTHIAZIDE 25 MG T: 25 | 30 days supply | Qty: 30 | Fill #1

## 2016-11-21 ENCOUNTER — Other Ambulatory Visit: Payer: Self-pay | Admitting: Family Medicine

## 2016-11-21 MED FILL — PRAVASTATIN NA 80 MG TAB: 80 | 30 days supply | Qty: 30 | Fill #0

## 2016-12-03 ENCOUNTER — Ambulatory Visit (AMBULATORY_SURGERY_CENTER): Payer: Self-pay | Admitting: *Deleted

## 2016-12-03 VITALS — Ht 63.0 in | Wt 181.6 lb

## 2016-12-03 DIAGNOSIS — Z1211 Encounter for screening for malignant neoplasm of colon: Secondary | ICD-10-CM

## 2016-12-03 MED ORDER — NA SULFATE-K SULFATE-MG SULF 17.5-3.13-1.6 GM/177ML PO SOLN: ORAL | 0 refills | Status: DC

## 2016-12-03 MED FILL — SUPREP BOWEL PREP KIT: 17.5-3.13-1 | 1 days supply | Qty: 354 | Fill #0

## 2016-12-03 NOTE — Progress Notes (Signed)
No egg or soy allergy  No anesthesia or intubation problems per pt  No diet medications taken  Registered in [place]  No home oxygen used or hx of sleep apnea  Pt is unsure of who did last colonoscopy, states "it was a long time ago."

## 2016-12-12 ENCOUNTER — Telehealth: Payer: Self-pay

## 2016-12-12 NOTE — Telephone Encounter (Signed)
Spoke with patient and she went ahead and picked up her prep at the pharmacy

## 2016-12-17 ENCOUNTER — Ambulatory Visit (AMBULATORY_SURGERY_CENTER): Payer: Medicaid Other | Admitting: Gastroenterology

## 2016-12-17 ENCOUNTER — Encounter: Payer: Self-pay | Admitting: Gastroenterology

## 2016-12-17 VITALS — BP 121/59 | HR 74 | Temp 96.9°F | Resp 22 | Ht 63.0 in | Wt 187.0 lb

## 2016-12-17 DIAGNOSIS — Z1212 Encounter for screening for malignant neoplasm of rectum: Secondary | ICD-10-CM | POA: Diagnosis not present

## 2016-12-17 DIAGNOSIS — Z1211 Encounter for screening for malignant neoplasm of colon: Secondary | ICD-10-CM | POA: Diagnosis not present

## 2016-12-17 DIAGNOSIS — K635 Polyp of colon: Secondary | ICD-10-CM

## 2016-12-17 DIAGNOSIS — D123 Benign neoplasm of transverse colon: Secondary | ICD-10-CM | POA: Diagnosis not present

## 2016-12-17 MED ORDER — SODIUM CHLORIDE 0.9 % IV SOLN: 500.0000 mL | INTRAVENOUS | Status: AC

## 2016-12-17 NOTE — Progress Notes (Signed)
Called to room to assist during endoscopic procedure.  Patient ID and intended procedure confirmed with present staff. Received instructions for my participation in the procedure from the performing physician.  

## 2016-12-17 NOTE — Op Note (Signed)
[name] Patient Name: Teresa Collier Procedure Date: 12/17/2016 12:06 PM MRN: [id number] Endoscopist: Remo Lipps P. Slyvester Latona MD, MD Age: 66 Referring MD:  Date of Birth: October 04, 1951 Gender: Female Account #: [id number] Procedure:                Colonoscopy Indications:              Screening for colorectal malignant neoplasm Medicines:                Monitored Anesthesia Care Procedure:                Pre-Anesthesia Assessment:                           - Prior to the procedure, a History and Physical                            was performed, and patient medications and                            allergies were reviewed. The patient's tolerance of                            previous anesthesia was also reviewed. The risks                            and benefits of the procedure and the sedation                            options and risks were discussed with the patient.                            All questions were answered, and informed consent                            was obtained. Prior Anticoagulants: The patient has                            taken no previous anticoagulant or antiplatelet                            agents. ASA Grade Assessment: II - A patient with                            mild systemic disease. After reviewing the risks                            and benefits, the patient was deemed in                            satisfactory condition to undergo the procedure.                           After obtaining informed consent, the colonoscope  was passed under direct vision. Throughout the                            procedure, the patient's blood pressure, pulse, and                            oxygen saturations were monitored continuously. The                            Model PCF-H190DL [id number]) scope was introduced                            through the anus and advanced to the the cecum,   identified by appendiceal orifice and ileocecal                            valve. The colonoscopy was performed without                            difficulty. The patient tolerated the procedure                            well. The quality of the bowel preparation was                            good. The ileocecal valve, appendiceal orifice, and                            rectum were photographed. Scope In: 1:32:20 PM Scope Out: 1:46:50 PM Scope Withdrawal Time: 0 hours 12 minutes 2 seconds  Total Procedure Duration: 0 hours 14 minutes 30 seconds  Findings:                 The perianal and digital rectal examinations were                            normal.                           A 6 mm polyp was found in the hepatic flexure. The                            polyp was sessile. The polyp was removed with a                            cold snare. Resection and retrieval were complete.                           The appendiceal orifice appeared atypical. Unclear                            if there was a diverticulum at the opening of the                            appendix (  there were multiple diverticulum in the                            cecum), but the base of it had an appearance                            concerning for possible sessile serrated polyp.                            Biopsies were taken with a cold forceps for                            histology.                           Many medium-mouthed diverticula were found in the                            entire colon.                           Internal hemorrhoids were found during retroflexion.                           The exam was otherwise without abnormality. Complications:            No immediate complications. Estimated blood loss:                            Minimal. Estimated Blood Loss:     Estimated blood loss was minimal. Impression:               - One 6 mm polyp at the hepatic flexure, removed                             with a cold snare. Resected and retrieved.                           - Mucosa at the appendiceal orifice as outlined                            above. Biopsied to ensure no adenomatous change.                           - Diverticulosis in the entire examined colon.                           - Internal hemorrhoids.                           - The examination was otherwise normal. Recommendation:           - Patient has a contact number available for                            emergencies. The signs and symptoms of potential  delayed complications were discussed with the                            patient. Return to normal activities tomorrow.                            Written discharge instructions were provided to the                            patient.                           - Resume previous diet.                           - Continue present medications.                           - No ibuprofen, naproxen, or other non-steroidal                            anti-inflammatory drugs for 2 weeks after polyp                            removal.                           - Await pathology results.                           - Repeat colonoscopy is recommended for                            surveillance. The colonoscopy date will be                            determined after pathology results from today's                            exam become available for review. Remo Lipps P. Alice Burnside MD, MD 12/17/2016 1:52:53 PM This report has been signed electronically.

## 2016-12-17 NOTE — Patient Instructions (Signed)
YOU HAD AN ENDOSCOPIC PROCEDURE TODAY AT THE [facility]:   Refer to the procedure report that was given to you for any specific questions about what was found during the examination.  If the procedure report does not answer your questions, please call your gastroenterologist to clarify.  If you requested that your care partner not be given the details of your procedure findings, then the procedure report has been included in a sealed envelope for you to review at your convenience later.  YOU SHOULD EXPECT: Some feelings of bloating in the abdomen. Passage of more gas than usual.  Walking can help get rid of the air that was put into your GI tract during the procedure and reduce the bloating. If you had a lower endoscopy (such as a colonoscopy or flexible sigmoidoscopy) you may notice spotting of blood in your stool or on the toilet paper. If you underwent a bowel prep for your procedure, you may not have a normal bowel movement for a few days.  Please Note:  You might notice some irritation and congestion in your nose or some drainage.  This is from the oxygen used during your procedure.  There is no need for concern and it should clear up in a day or so.  SYMPTOMS TO REPORT IMMEDIATELY:   Following lower endoscopy (colonoscopy or flexible sigmoidoscopy):  Excessive amounts of blood in the stool  Significant tenderness or worsening of abdominal pains  Swelling of the abdomen that is new, acute  Fever of 100F or higher   For urgent or emergent issues, a gastroenterologist can be reached at any hour by calling [phone].   DIET:  We do recommend a small meal at first, but then you may proceed to your regular diet.  Drink plenty of fluids but you should avoid alcoholic beverages for 24 hours. Try to increase the fiber in your diet, and drink plenty of water.  ACTIVITY:  You should plan to take it easy for the rest of today and you should NOT DRIVE or use heavy machinery until  tomorrow (because of the sedation medicines used during the test).    FOLLOW UP: Our staff will call the number listed on your records the next business day following your procedure to check on you and address any questions or concerns that you may have regarding the information given to you following your procedure. If we do not reach you, we will leave a message.  However, if you are feeling well and you are not experiencing any problems, there is no need to return our call.  We will assume that you have returned to your regular daily activities without incident.  If any biopsies were taken you will be contacted by phone or by letter within the next 1-3 weeks.  Please call us at [phone] if you have not heard about the biopsies in 3 weeks.    SIGNATURES/CONFIDENTIALITY: You and/or your care partner have signed paperwork which will be entered into your electronic medical record.  These signatures attest to the fact that that the information above on your After Visit Summary has been reviewed and is understood.  Full responsibility of the confidentiality of this discharge information lies with you and/or your care-partner.  Read all of the handouts given to you by your recovery room nurse. 

## 2016-12-18 ENCOUNTER — Telehealth: Payer: Self-pay | Admitting: *Deleted

## 2016-12-18 ENCOUNTER — Telehealth: Payer: Self-pay

## 2016-12-18 NOTE — Telephone Encounter (Signed)
Left message on answering machine. 

## 2016-12-18 NOTE — Telephone Encounter (Signed)
  Follow up Call-  Call back number 12/17/2016  Post procedure Call Back phone  # [phone]  Permission to leave phone message Yes  Some recent data might be hidden     Patient questions:  Do you have a fever, pain , or abdominal swelling? No. Pain Score  0 *  Have you tolerated food without any problems? Yes.    Have you been able to return to your normal activities? Yes.    Do you have any questions about your discharge instructions: Diet   No. Medications  No. Follow up visit  No.  Do you have questions or concerns about your Care? No.  Actions: * If pain score is 4 or above: No action needed, pain <4.

## 2016-12-19 MED FILL — HYDROCHLOROTHIAZIDE 25 MG T: 25 | 30 days supply | Qty: 30 | Fill #2

## 2016-12-20 ENCOUNTER — Other Ambulatory Visit: Payer: Self-pay | Admitting: Family Medicine

## 2016-12-20 MED FILL — PRAVASTATIN NA 80 MG TAB: 80 | 30 days supply | Qty: 30 | Fill #0

## 2016-12-21 ENCOUNTER — Encounter: Payer: Self-pay | Admitting: Gastroenterology

## 2016-12-25 MED FILL — VERAPAMIL 360 MG CAP PELLET: 360 | 90 days supply | Qty: 90 | Fill #1

## 2016-12-31 ENCOUNTER — Emergency Department (HOSPITAL_COMMUNITY)
Admission: EM | Admit: 2016-12-31 | Discharge: 2016-12-31 | Disposition: A | Discharge status: Home / Self Care | Payer: Medicaid Other | Attending: Emergency Medicine | Admitting: Emergency Medicine

## 2016-12-31 ENCOUNTER — Encounter (HOSPITAL_COMMUNITY): Payer: Self-pay | Admitting: Emergency Medicine

## 2016-12-31 DIAGNOSIS — R51 Headache: Secondary | ICD-10-CM | POA: Diagnosis present

## 2016-12-31 DIAGNOSIS — J0100 Acute maxillary sinusitis, unspecified: Secondary | ICD-10-CM | POA: Insufficient documentation

## 2016-12-31 DIAGNOSIS — I10 Essential (primary) hypertension: Secondary | ICD-10-CM | POA: Diagnosis not present

## 2016-12-31 DIAGNOSIS — Z87891 Personal history of nicotine dependence: Secondary | ICD-10-CM | POA: Diagnosis not present

## 2016-12-31 MED ORDER — FLUTICASONE PROPIONATE 50 MCG/ACT NA SUSP: 2.0000 | Freq: Every day | NASAL | 2 refills | Status: DC

## 2016-12-31 MED ORDER — LORATADINE 10 MG PO TABS: 10.0000 mg | ORAL_TABLET | Freq: Every day | ORAL | 0 refills | Status: DC

## 2016-12-31 MED FILL — FLUTICASONE PROP 50 MCG SPR: 50 | 30 days supply | Qty: 16 | Fill #0

## 2016-12-31 NOTE — ED Triage Notes (Signed)
Patient reports sinus pain and congestion since Friday. Patient has little cough that is non-productive.

## 2016-12-31 NOTE — ED Notes (Signed)
Discharge instructions, follow up care, and rx x2 reviewed with patient. Patient verbalized understanding. 

## 2016-12-31 NOTE — ED Provider Notes (Signed)
[facility] DEPT Provider Note   CSN: [id number] Arrival date & time: 12/31/16  0755    History   Chief Complaint Chief Complaint  Patient presents with  . Facial Pain    HPI Teresa Collier is a 66 y.o. female with a h/o of HTN who presents to the Emergency Department with complaints sinus congestion with associated sinus pain, sneezing, watery eyes, and  non-productive cough. She reports the symptoms have been worsening since they began 3 days ago. No fever, chills, rash, dyspnea, chest pain, sore throat, or headache.  She began treatment at home with Tylenol Cold & and Sinus, but d/c'ed the medication yesterday after her blood pressure elevated.   Sick contacts include her grandson who was recently seen by his PCP for similar symptoms. Pt's flu vaccination is UTD. She is a former smoker,   The history is provided by the patient. No language interpreter was used.    Past Medical History:  Diagnosis Date  . Blood transfusion without reported diagnosis    1973  . Cataract    "beginnings"  . Heart murmur   . Hyperlipidemia   . Hypertension     Patient Active Problem List   Diagnosis Date Noted  . Essential hypertension 10/16/2016  . Hyperlipidemia 10/16/2016  . Closed disp fracture of left lateral malleolus with routine healing 08/23/2016    Past Surgical History:  Procedure Laterality Date  . APPENDECTOMY    . BUNIONECTOMY    . COLONOSCOPY    . OPEN REDUCTION INTERNAL FIXATION (ORIF) FOOT LISFRANC FRACTURE Left 07/25/2016   Procedure: OPEN REDUCTION INTERNAL FIXATION (ORIF) FOOT LISFRANC FRACTURE;  Surgeon: Leandrew Koyanagi, MD;  Location: [facility];  Service: Orthopedics;  Laterality: Left;  . ORIF ANKLE FRACTURE Left 07/25/2016   Procedure: OPEN REDUCTION INTERNAL FIXATION (ORIF) LEFT BIMALLEOLAR  ANKLE AND LISFRANC FRACTURES;  Surgeon: Leandrew Koyanagi, MD;  Location: [facility];  Service: Orthopedics;  Laterality: Left;    OB History    No data available     Home Medications    Prior to Admission medications   Medication Sig Start Date End Date Taking? Authorizing Provider  hydrochlorothiazide (HYDRODIURIL) 25 MG tablet Take 1 tablet (25 mg total) by mouth daily. 09/24/16   Micheline Chapman, NP  pravastatin (PRAVACHOL) 80 MG tablet TAKE 1 TABLET BY MOUTH ONCE DAILY 12/20/16   Dorena Dew, FNP  verapamil (VERELAN PM) 360 MG 24 hr capsule Take 1 capsule (360 mg total) by mouth daily. 09/24/16   Micheline Chapman, NP    Family History Family History  Problem Relation Age of Onset  . Esophageal cancer Father   . Colon cancer Neg Hx   . Rectal cancer Neg Hx   . Stomach cancer Neg Hx     Social History Social History  Substance Use Topics  . Smoking status: Former Smoker    Packs/day: 2.00    Years: 20.00    Types: Cigarettes    Quit date: 01/27/2009  . Smokeless tobacco: Never Used  . Alcohol use No     Comment: stopped drinking 2010   Allergies   Patient has no known allergies.   Review of Systems Review of Systems  Constitutional: Negative for chills and fever.  HENT: Positive for congestion, rhinorrhea, sinus pain, sinus pressure and sneezing. Negative for ear pain and sore throat.   Eyes: Negative for discharge and visual disturbance.  Respiratory: Positive for cough. Negative for shortness of breath.  Cardiovascular: Negative for chest pain.  Gastrointestinal: Negative for abdominal pain, diarrhea, nausea and vomiting.  Musculoskeletal: Negative for arthralgias.  Skin: Negative for rash.  Neurological: Negative for headaches.  Hematological: Negative for adenopathy.   Physical Exam Updated Vital Signs BP 123/77 (BP Location: Left Arm)   Pulse 94   Temp 98.2 F (36.8 C) (Oral)   Resp 17   SpO2 98%   Physical Exam  Constitutional: She is oriented to person, place, and time. She appears well-developed and well-nourished.  HENT:  Head: Normocephalic and atraumatic.  Right Ear: External  ear normal.  Left Ear: External ear normal.  Nose: Mucosal edema and rhinorrhea present. No epistaxis. Right sinus exhibits maxillary sinus tenderness and frontal sinus tenderness. Left sinus exhibits maxillary sinus tenderness and frontal sinus tenderness.  Mouth/Throat: Uvula is midline, oropharynx is clear and moist and mucous membranes are normal. No oropharyngeal exudate or posterior oropharyngeal erythema.  Bilateral mucosal erythema. TTP over the bilateral maxillary sinuses. No tenderness over the bilateral frontal sinuses.   Eyes: Conjunctivae are normal. Right eye exhibits no discharge. Left eye exhibits no discharge.  Neck: Normal range of motion. Neck supple.  Cardiovascular: Normal rate, regular rhythm and normal heart sounds.  Exam reveals no gallop and no friction rub.   No murmur heard. Pulmonary/Chest: Effort normal and breath sounds normal. No respiratory distress. She has no wheezes. She has no rales. She exhibits no tenderness.  Neurological: She is alert and oriented to person, place, and time.  Skin: Skin is warm and dry. No rash noted. No erythema.  Nursing note and vitals reviewed.  ED Treatments / Results  Labs (all labs ordered are listed, but only abnormal results are displayed) Labs Reviewed - No data to display  Procedures Procedures (including critical care time)  Medications Ordered in ED Medications - No data to display   Initial Impression / Assessment and Plan / ED Course  I have reviewed the triage vital signs and the nursing notes.  Pertinent labs & imaging results that were available during my care of the patient were reviewed by me and considered in my medical decision making (see chart for details).    Teresa Collier is a 66 y.o. Female with a 3-day h/o of sinus congestion, sinus pain, sneezing, rhinorrhea, and a non-productive cough. She is TTP over the bilateral maxillary sinuses with bilateral erythema and edema of the nasal mucosa. No  tenderness to palpation over the bilateral frontal sinuses. No fever, chills, dyspnea, or rash.  Patient denies a  h/o of PUD, GI bleeds, or chronic kidney disease. She treated her symptoms at home with Tylenol Cold & Sinus at home for 2 days, but d/c'ed the medication yesterday after her blood pressure elevated. No other contraindications to NSAIDs. Avoid antibiotics at this time since symptoms have only been present for 3 days. Will treat symptoms with Flonase and Claritin.  Counseled the patient to return to the ED or follow-up with her PCP in one week if symptoms worsen or persist.   Final Clinical Impressions(s) / ED Diagnoses   Final diagnoses:  None    New Prescriptions New Prescriptions   No medications on file     Ravin Bendall Ophelia Charter, PA-C 12/31/16 [place], MD 12/31/16 1154

## 2016-12-31 NOTE — ED Provider Notes (Signed)
8:48 AM Patient seen in conjunction with McDonald PA-C.   Pt with h/o HTN, sinus pressure and congestion x 3 days. OTC meds made her BP high at home.   On exam, bilateral maxillary sinus tenderness. Well-appearing. BP 123/77 (BP Location: Left Arm)   Pulse 94   Temp 98.2 F (36.8 C) (Oral)   Resp 17   SpO2 98%   Will treat symptomatically. No contraindications to NSAIDs other than HTN but seems to be reasonably controlled. Avoid abx as symptoms have been present only 3 days.    Carlisle Cater, PA-C 12/31/16 [place], MD 12/31/16 1154

## 2017-01-15 ENCOUNTER — Ambulatory Visit: Payer: Self-pay | Admitting: Family Medicine

## 2017-01-18 ENCOUNTER — Ambulatory Visit: Payer: Self-pay | Admitting: Family Medicine

## 2017-01-21 MED FILL — HYDROCHLOROTHIAZIDE 25 MG T: 25 | 30 days supply | Qty: 30 | Fill #3

## 2017-02-06 ENCOUNTER — Other Ambulatory Visit: Payer: Self-pay | Admitting: Family Medicine

## 2017-02-06 MED FILL — PRAVASTATIN NA 80 MG TAB: 80 | 30 days supply | Qty: 30 | Fill #0

## 2017-02-21 MED FILL — HYDROCHLOROTHIAZIDE 25 MG T: 25 | 30 days supply | Qty: 30 | Fill #4

## 2017-03-13 ENCOUNTER — Other Ambulatory Visit: Payer: Self-pay | Admitting: Family Medicine

## 2017-03-15 ENCOUNTER — Other Ambulatory Visit: Payer: Self-pay | Admitting: Family Medicine

## 2017-03-18 MED FILL — PRAVASTATIN NA 80 MG TAB: 80 | 30 days supply | Qty: 30 | Fill #0

## 2017-04-01 MED FILL — HYDROCHLOROTHIAZIDE 25 MG T: 25 | 30 days supply | Qty: 30 | Fill #5

## 2017-04-03 ENCOUNTER — Other Ambulatory Visit: Payer: Self-pay | Admitting: Family Medicine

## 2017-04-03 ENCOUNTER — Telehealth: Payer: Self-pay

## 2017-04-03 MED ORDER — VERAPAMIL HCL ER 360 MG PO CP24: 360.0000 mg | ORAL_CAPSULE | Freq: Every day | ORAL | 0 refills | Status: DC

## 2017-04-03 NOTE — Telephone Encounter (Signed)
Refilled verapamil # 5 tablets only. Patient has an appointment scheduled 04/08/17

## 2017-04-04 MED FILL — VERAPAMIL ER 180 MG TABLET: 180 | 5 days supply | Qty: 10 | Fill #0

## 2017-04-08 ENCOUNTER — Encounter: Payer: Self-pay | Admitting: Family Medicine

## 2017-04-08 ENCOUNTER — Ambulatory Visit (INDEPENDENT_AMBULATORY_CARE_PROVIDER_SITE_OTHER): Payer: Medicaid Other | Admitting: Family Medicine

## 2017-04-08 VITALS — BP 136/68 | HR 78 | Temp 98.4°F | Resp 14 | Ht 63.0 in | Wt 193.0 lb

## 2017-04-08 DIAGNOSIS — E785 Hyperlipidemia, unspecified: Secondary | ICD-10-CM | POA: Diagnosis not present

## 2017-04-08 DIAGNOSIS — I10 Essential (primary) hypertension: Secondary | ICD-10-CM

## 2017-04-08 LAB — CBC WITH DIFFERENTIAL/PLATELET
BASOS PCT: 1 %
Basophils Absolute: 85 cells/uL (ref 0–200)
EOS ABS: 85 {cells}/uL (ref 15–500)
Eosinophils Relative: 1 %
HEMATOCRIT: 41.1 % (ref 35.0–45.0)
HEMOGLOBIN: 13.4 g/dL (ref 11.7–15.5)
LYMPHS ABS: 3910 {cells}/uL — AB (ref 850–3900)
LYMPHS PCT: 46 %
MCH: 30 pg (ref 27.0–33.0)
MCHC: 32.6 g/dL (ref 32.0–36.0)
MCV: 92.2 fL (ref 80.0–100.0)
MONO ABS: 510 {cells}/uL (ref 200–950)
MPV: 10.1 fL (ref 7.5–12.5)
Monocytes Relative: 6 %
NEUTROS PCT: 46 %
Neutro Abs: 3910 cells/uL (ref 1500–7800)
Platelets: 278 10*3/uL (ref 140–400)
RBC: 4.46 MIL/uL (ref 3.80–5.10)
RDW: 13.9 % (ref 11.0–15.0)
WBC: 8.5 10*3/uL (ref 3.8–10.8)

## 2017-04-08 LAB — POCT URINALYSIS DIP (DEVICE)
BILIRUBIN URINE: NEGATIVE
GLUCOSE, UA: NEGATIVE mg/dL
Hgb urine dipstick: NEGATIVE
KETONES UR: NEGATIVE mg/dL
NITRITE: NEGATIVE
PROTEIN: NEGATIVE mg/dL
Specific Gravity, Urine: 1.015 (ref 1.005–1.030)
Urobilinogen, UA: 0.2 mg/dL (ref 0.0–1.0)
pH: 7.5 (ref 5.0–8.0)

## 2017-04-08 LAB — COMPLETE METABOLIC PANEL WITH GFR
ALT: 10 U/L (ref 6–29)
AST: 12 U/L (ref 10–35)
Albumin: 4 g/dL (ref 3.6–5.1)
Alkaline Phosphatase: 108 U/L (ref 33–130)
BUN: 11 mg/dL (ref 7–25)
CALCIUM: 9.9 mg/dL (ref 8.6–10.4)
CHLORIDE: 103 mmol/L (ref 98–110)
CO2: 25 mmol/L (ref 20–31)
Creat: 1.01 mg/dL — ABNORMAL HIGH (ref 0.50–0.99)
GFR, EST NON AFRICAN AMERICAN: 59 mL/min — AB (ref >=60)
GFR, Est African American: 68 mL/min (ref >=60)
Glucose, Bld: 94 mg/dL (ref 65–99)
POTASSIUM: 3.7 mmol/L (ref 3.5–5.3)
Sodium: 139 mmol/L (ref 135–146)
Total Bilirubin: 0.4 mg/dL (ref 0.2–1.2)
Total Protein: 7 g/dL (ref 6.1–8.1)

## 2017-04-08 LAB — LIPID PANEL
CHOL/HDL RATIO: 3.2 ratio (ref <5.0)
CHOLESTEROL: 158 mg/dL (ref <200)
HDL: 50 mg/dL — ABNORMAL LOW (ref >50)
LDL Cholesterol: 82 mg/dL (ref <100)
Triglycerides: 128 mg/dL (ref <150)
VLDL: 26 mg/dL (ref <30)

## 2017-04-08 MED ORDER — VERAPAMIL HCL ER 360 MG PO CP24: 360.0000 mg | ORAL_CAPSULE | Freq: Every day | ORAL | 1 refills | Status: DC

## 2017-04-08 MED ORDER — LORATADINE 10 MG PO TABS: 10.0000 mg | ORAL_TABLET | Freq: Every day | ORAL | 1 refills | Status: DC

## 2017-04-08 MED ORDER — HYDROCHLOROTHIAZIDE 25 MG PO TABS: 25.0000 mg | ORAL_TABLET | Freq: Every day | ORAL | 1 refills | Status: DC

## 2017-04-08 MED ORDER — PRAVASTATIN SODIUM 80 MG PO TABS: 80.0000 mg | ORAL_TABLET | Freq: Every day | ORAL | 1 refills | Status: DC

## 2017-04-08 MED ORDER — FLUTICASONE PROPIONATE 50 MCG/ACT NA SUSP: 2.0000 | Freq: Every day | NASAL | 2 refills | Status: DC

## 2017-04-08 MED FILL — VERAPAMIL 360 MG CAP PELLET: 360 | 90 days supply | Qty: 90 | Fill #0

## 2017-04-08 MED FILL — PRAVASTATIN NA 80 MG TAB: 80 | 90 days supply | Qty: 90 | Fill #0

## 2017-04-08 NOTE — Patient Instructions (Signed)
Increase physical activity daily.  It is recommended for cardiovascular health is to engage in physical activity 150 minutes weekly.  Blood pressure is well controlled today.   Return for follow-up in 6 months.     Exercising to Lose Weight Exercising can help you to lose weight. In order to lose weight through exercise, you need to do vigorous-intensity exercise. You can tell that you are exercising with vigorous intensity if you are breathing very hard and fast and cannot hold a conversation while exercising. Moderate-intensity exercise helps to maintain your current weight. You can tell that you are exercising at a moderate level if you have a higher heart rate and faster breathing, but you are still able to hold a conversation. How often should I exercise? Choose an activity that you enjoy and set realistic goals. Your health care provider can help you to make an activity plan that works for you. Exercise regularly as directed by your health care provider. This may include:  Doing resistance training twice each week, such as: ? Push-ups. ? Sit-ups. ? Lifting weights. ? Using resistance bands.  Doing a given intensity of exercise for a given amount of time. Choose from these options: ? 150 minutes of moderate-intensity exercise every week. ? 75 minutes of vigorous-intensity exercise every week. ? A mix of moderate-intensity and vigorous-intensity exercise every week.  Children, pregnant women, people who are out of shape, people who are overweight, and older adults may need to consult a health care provider for individual recommendations. If you have any sort of medical condition, be sure to consult your health care provider before starting a new exercise program. What are some activities that can help me to lose weight?  Walking at a rate of at least 4.5 miles an hour.  Jogging or running at a rate of 5 miles per hour.  Biking at a rate of at least 10 miles per hour.  Lap  swimming.  Roller-skating or in-line skating.  Cross-country skiing.  Vigorous competitive sports, such as football, basketball, and soccer.  Jumping rope.  Aerobic dancing. How can I be more active in my day-to-day activities?  Use the stairs instead of the elevator.  Take a walk during your lunch break.  If you drive, park your car farther away from work or school.  If you take public transportation, get off one stop early and walk the rest of the way.  Make all of your phone calls while standing up and walking around.  Get up, stretch, and walk around every 30 minutes throughout the day. What guidelines should I follow while exercising?  Do not exercise so much that you hurt yourself, feel dizzy, or get very short of breath.  Consult your health care provider prior to starting a new exercise program.  Wear comfortable clothes and shoes with good support.  Drink plenty of water while you exercise to prevent dehydration or heat stroke. Body water is lost during exercise and must be replaced.  Work out until you breathe faster and your heart beats faster. This information is not intended to replace advice given to you by your health care provider. Make sure you discuss any questions you have with your health care provider. Document Released: 11/17/2010 Document Revised: 03/22/2016 Document Reviewed: 03/18/2014 Elsevier Interactive Patient Education  2018 [employer].  Hypertension Hypertension is another name for high blood pressure. High blood pressure forces your heart to work harder to pump blood. This can cause problems over time. There are two  numbers in a blood pressure reading. There is a top number (systolic) over a bottom number (diastolic). It is best to have a blood pressure below 120/80. Healthy choices can help lower your blood pressure. You may need medicine to help lower your blood pressure if:  Your blood pressure cannot be lowered with healthy  choices.  Your blood pressure is higher than 130/80.  Follow these instructions at home: Eating and drinking  If directed, follow the DASH eating plan. This diet includes: ? Filling half of your plate at each meal with fruits and vegetables. ? Filling one quarter of your plate at each meal with whole grains. Whole grains include whole wheat pasta, brown rice, and whole grain bread. ? Eating or drinking low-fat dairy products, such as skim milk or low-fat yogurt. ? Filling one quarter of your plate at each meal with low-fat (lean) proteins. Low-fat proteins include fish, skinless chicken, eggs, beans, and tofu. ? Avoiding fatty meat, cured and processed meat, or chicken with skin. ? Avoiding premade or processed food.  Eat less than 1,500 mg of salt (sodium) a day.  Limit alcohol use to no more than 1 drink a day for nonpregnant women and 2 drinks a day for men. One drink equals 12 oz of beer, 5 oz of wine, or 1 oz of hard liquor. Lifestyle  Work with your doctor to stay at a healthy weight or to lose weight. Ask your doctor what the best weight is for you.  Get at least 30 minutes of exercise that causes your heart to beat faster (aerobic exercise) most days of the week. This may include walking, swimming, or biking.  Get at least 30 minutes of exercise that strengthens your muscles (resistance exercise) at least 3 days a week. This may include lifting weights or pilates.  Do not use any products that contain nicotine or tobacco. This includes cigarettes and e-cigarettes. If you need help quitting, ask your doctor.  Check your blood pressure at home as told by your doctor.  Keep all follow-up visits as told by your doctor. This is important. Medicines  Take over-the-counter and prescription medicines only as told by your doctor. Follow directions carefully.  Do not skip doses of blood pressure medicine. The medicine does not work as well if you skip doses. Skipping doses also puts  you at risk for problems.  Ask your doctor about side effects or reactions to medicines that you should watch for. Contact a doctor if:  You think you are having a reaction to the medicine you are taking.  You have headaches that keep coming back (recurring).  You feel dizzy.  You have swelling in your ankles.  You have trouble with your vision. Get help right away if:  You get a very bad headache.  You start to feel confused.  You feel weak or numb.  You feel faint.  You get very bad pain in your: ? Chest. ? Belly (abdomen).  You throw up (vomit) more than once.  You have trouble breathing. Summary  Hypertension is another name for high blood pressure.  Making healthy choices can help lower blood pressure. If your blood pressure cannot be controlled with healthy choices, you may need to take medicine. This information is not intended to replace advice given to you by your health care provider. Make sure you discuss any questions you have with your health care provider. Document Released: 04/02/2008 Document Revised: 09/12/2016 Document Reviewed: 09/12/2016 Elsevier Interactive Patient Education  2018  [employer].

## 2017-04-08 NOTE — Progress Notes (Signed)
Patient ID: Teresa Collier, female    DOB: 1951/10/02, 66 y.o.   MRN: [id number]  PCP: Scot Jun, FNP  Chief Complaint  Patient presents with  . Follow-up    blood pressure  . Medication Refill    verapamil    Subjective:  HPI  Teresa Collier is a 66 y.o. female presents today for hypertension follow-up and medication management.  Hypertension/Hyperlipidemia  Teresa Collier reports no home monitoring of blood pressure. Reports adherence to blood pressure medications. Reports efforts to adhere to low sodium diet. Avoids fatty foods, eats more fruit, than vegetables, Making efforts presently to reduce weight, Body mass index is 34.19 kg/m. She is a nonsmoker, although has a history of smoking (quit 2010) . Denies any episodes of dizziness, headaches, swelling, shortness of breath, or chest pain. Requests that all medication be refilled for 90 days.    Social History   Social History  . Marital status: Married    Spouse name: N/A  . Number of children: N/A  . Years of education: N/A   Occupational History  . Not on file.   Social History Main Topics  . Smoking status: Former Smoker    Packs/day: 2.00    Years: 20.00    Types: Cigarettes    Quit date: 01/27/2009  . Smokeless tobacco: Never Used  . Alcohol use No     Comment: stopped drinking 2010  . Drug use: No  . Sexual activity: Not on file   Other Topics Concern  . Not on file   Social History Narrative  . No narrative on file    Family History  Problem Relation Age of Onset  . Esophageal cancer Father   . Colon cancer Neg Hx   . Rectal cancer Neg Hx   . Stomach cancer Neg Hx    Review of Systems See HPI  Patient Active Problem List   Diagnosis Date Noted  . Essential hypertension 10/16/2016  . Hyperlipidemia 10/16/2016  . Closed disp fracture of left lateral malleolus with routine healing 08/23/2016    No Known Allergies  Prior to Admission medications   Medication Sig Start  Date End Date Taking? Authorizing Provider  fluticasone (FLONASE) 50 MCG/ACT nasal spray Place 2 sprays into both nostrils daily. 12/31/16  Yes McDonald, Mia A, PA-C  hydrochlorothiazide (HYDRODIURIL) 25 MG tablet Take 1 tablet (25 mg total) by mouth daily. 09/24/16  Yes Micheline Chapman, NP  loratadine (CLARITIN) 10 MG tablet Take 1 tablet (10 mg total) by mouth daily. 12/31/16  Yes McDonald, Mia A, PA-C  pravastatin (PRAVACHOL) 80 MG tablet TAKE 1 TABLET BY MOUTH ONCE DAILY 03/18/17  Yes Dorena Dew, FNP  verapamil (VERELAN PM) 360 MG 24 hr capsule Take 1 capsule (360 mg total) by mouth daily. Need office visit for any additional refills 04/03/17  Yes Scot Jun, FNP    Past Medical, Surgical Family and Social History reviewed and updated.    Objective:   Today's Vitals   04/08/17 0835  BP: 136/68  Pulse: 78  Resp: 14  Temp: 98.4 F (36.9 C)  TempSrc: Oral  SpO2: 96%  Weight: 193 lb (87.5 kg)  Height: 5\' 3"  (1.6 m)    Wt Readings from Last 3 Encounters:  04/08/17 193 lb (87.5 kg)  12/17/16 187 lb (84.8 kg)  12/03/16 181 lb 9.6 oz (82.4 kg)    Physical Exam  Constitutional: She is oriented to person, place, and time. She appears well-developed and well-nourished.  HENT:  Head: Atraumatic.  Eyes: Conjunctivae and EOM are normal. Pupils are equal, round, and reactive to light.  Neck: Normal range of motion. Neck supple. No thyromegaly present.  Cardiovascular: Normal rate, regular rhythm, normal heart sounds and intact distal pulses.   Pulmonary/Chest: Effort normal and breath sounds normal.  Musculoskeletal: Normal range of motion.  Neurological: She is alert and oriented to person, place, and time. She has normal reflexes.  Skin: Skin is warm and dry.  Psychiatric: She has a normal mood and affect. Her behavior is normal. Thought content normal.    Assessment & Plan:  1. Essential hypertension - CBC with Differential - COMPLETE METABOLIC PANEL WITH GFR  2.  Hyperlipidemia, unspecified hyperlipidemia type - Thyroid Panel With TSH - Lipid panel  -It is recommended for cardiovascular health is to engage in physical activity 150 minutes weekly.  RTC: 6 month for hypertension follow-up  Teresa Sage. Kenton Kingfisher, MSN, FNP-C [facility]  [address] [place], [place] [address] [phone]

## 2017-04-09 LAB — THYROID PANEL WITH TSH
FREE THYROXINE INDEX: 2.1 (ref 1.4–3.8)
T3 UPTAKE: 26 % (ref 22–35)
T4, Total: 7.9 ug/dL (ref 4.5–12.0)
TSH: 1.28 mIU/L

## 2017-06-22 ENCOUNTER — Encounter (HOSPITAL_COMMUNITY): Payer: Self-pay | Admitting: Emergency Medicine

## 2017-06-22 ENCOUNTER — Emergency Department (HOSPITAL_COMMUNITY): Payer: Medicaid Other

## 2017-06-22 ENCOUNTER — Emergency Department (HOSPITAL_COMMUNITY)
Admission: EM | Admit: 2017-06-22 | Discharge: 2017-06-22 | Disposition: A | Discharge status: Home / Self Care | Payer: Medicaid Other | Attending: Emergency Medicine | Admitting: Emergency Medicine

## 2017-06-22 DIAGNOSIS — M25522 Pain in left elbow: Secondary | ICD-10-CM | POA: Insufficient documentation

## 2017-06-22 DIAGNOSIS — Z79899 Other long term (current) drug therapy: Secondary | ICD-10-CM | POA: Insufficient documentation

## 2017-06-22 DIAGNOSIS — I10 Essential (primary) hypertension: Secondary | ICD-10-CM | POA: Insufficient documentation

## 2017-06-22 DIAGNOSIS — Z7902 Long term (current) use of antithrombotics/antiplatelets: Secondary | ICD-10-CM | POA: Insufficient documentation

## 2017-06-22 DIAGNOSIS — M79632 Pain in left forearm: Secondary | ICD-10-CM | POA: Diagnosis present

## 2017-06-22 MED ORDER — PREDNISONE 20 MG PO TABS
60.0000 mg | ORAL_TABLET | ORAL | Status: AC
  Administered 2017-06-22: 60 mg via ORAL
  Filled 2017-06-22: qty 3

## 2017-06-22 MED ORDER — PREDNISONE 20 MG PO TABS: 40.0000 mg | ORAL_TABLET | Freq: Every day | ORAL | 0 refills | Status: DC

## 2017-06-22 NOTE — Discharge Instructions (Signed)
As discussed, your evaluation today has been largely reassuring.  But, it is important that you monitor your condition carefully, and do not hesitate to return to the ED if you develop new, or concerning changes in your condition. ? ?Otherwise, please follow-up with your physician for appropriate ongoing care. ? ?

## 2017-06-22 NOTE — ED Notes (Signed)
Patient transported to X-ray 

## 2017-06-22 NOTE — ED Triage Notes (Signed)
Pt reports L elbow pain for the past week. No known injury. Pain worse with movement and raising elbow.

## 2017-06-22 NOTE — ED Provider Notes (Signed)
[facility] DEPT Provider Note   CSN: [id number] Arrival date & time: 06/22/17  0750     History   Chief Complaint Chief Complaint  Patient presents with  . Elbow Pain    HPI Teresa Collier is a 66 y.o. female.  HPI  Patient presents with new proximal posterior forearm pain. Pain began about one week ago without identified precipitant. Since onset the pain is been focally in theproximal posterior forearm, not in the elbow. Pain is sore, severe, worse with attempted motion. Pain radiates superiorly. No focal shoulder or wrist pain. No loss of sensation or weakness in the hand. No fever, no chills, no skin changes. No other complaints. No relief with OTC medication.   Past Medical History:  Diagnosis Date  . Blood transfusion without reported diagnosis    1973  . Cataract    "beginnings"  . Heart murmur   . Hyperlipidemia   . Hypertension     Patient Active Problem List   Diagnosis Date Noted  . Essential hypertension 10/16/2016  . Hyperlipidemia 10/16/2016  . Closed disp fracture of left lateral malleolus with routine healing 08/23/2016    Past Surgical History:  Procedure Laterality Date  . APPENDECTOMY    . BUNIONECTOMY    . COLONOSCOPY    . OPEN REDUCTION INTERNAL FIXATION (ORIF) FOOT LISFRANC FRACTURE Left 07/25/2016   Procedure: OPEN REDUCTION INTERNAL FIXATION (ORIF) FOOT LISFRANC FRACTURE;  Surgeon: Leandrew Koyanagi, MD;  Location: [facility];  Service: Orthopedics;  Laterality: Left;  . ORIF ANKLE FRACTURE Left 07/25/2016   Procedure: OPEN REDUCTION INTERNAL FIXATION (ORIF) LEFT BIMALLEOLAR  ANKLE AND LISFRANC FRACTURES;  Surgeon: Leandrew Koyanagi, MD;  Location: [facility];  Service: Orthopedics;  Laterality: Left;    OB History    No data available       Home Medications    Prior to Admission medications   Medication Sig Start Date End Date Taking? Authorizing Provider  fluticasone (FLONASE) 50 MCG/ACT nasal  spray Place 2 sprays into both nostrils daily. 04/08/17   Scot Jun, FNP  hydrochlorothiazide (HYDRODIURIL) 25 MG tablet Take 1 tablet (25 mg total) by mouth daily. 04/08/17   Scot Jun, FNP  loratadine (CLARITIN) 10 MG tablet Take 1 tablet (10 mg total) by mouth daily. 04/08/17   Scot Jun, FNP  pravastatin (PRAVACHOL) 80 MG tablet Take 1 tablet (80 mg total) by mouth daily. 04/08/17   Scot Jun, FNP  verapamil (VERELAN PM) 360 MG 24 hr capsule Take 1 capsule (360 mg total) by mouth daily. Need office visit for any additional refills 04/08/17   Scot Jun, FNP    Family History Family History  Problem Relation Age of Onset  . Esophageal cancer Father   . Colon cancer Neg Hx   . Rectal cancer Neg Hx   . Stomach cancer Neg Hx     Social History Social History  Substance Use Topics  . Smoking status: Former Smoker    Packs/day: 2.00    Years: 20.00    Types: Cigarettes    Quit date: 01/27/2009  . Smokeless tobacco: Never Used  . Alcohol use No     Comment: stopped drinking 2010     Allergies   Patient has no known allergies.   Review of Systems Review of Systems  Constitutional:       Per HPI, otherwise negative  HENT:       Per HPI, otherwise negative  Respiratory:  Per HPI, otherwise negative  Cardiovascular:       Per HPI, otherwise negative  Gastrointestinal: Negative for vomiting.  Endocrine:       Negative aside from HPI  Genitourinary:       Neg aside from HPI   Musculoskeletal:       Per HPI, otherwise negative  Skin: Negative.   Neurological: Negative for syncope.     Physical Exam Updated Vital Signs BP 136/79 (BP Location: Right Arm)   Pulse 70   Temp 97.7 F (36.5 C) (Oral)   Resp 18   SpO2 96%   Physical Exam  Constitutional: She is oriented to person, place, and time. She appears well-developed and well-nourished. No distress.  HENT:  Head: Normocephalic and atraumatic.  Eyes: Conjunctivae and  EOM are normal.  Cardiovascular: Normal rate and regular rhythm.   Pulmonary/Chest: Effort normal and breath sounds normal. No stridor. No respiratory distress.  Abdominal: She exhibits no distension.  Musculoskeletal: She exhibits no edema.       Left shoulder: Normal.       Left elbow: She exhibits normal range of motion, no swelling, no effusion, no deformity and no laceration. Tenderness found.       Left wrist: Normal.       Arms: Neurological: She is alert and oriented to person, place, and time. No cranial nerve deficit.  Skin: Skin is warm and dry.  Psychiatric: She has a normal mood and affect.  Nursing note and vitals reviewed.    ED Treatments / Results   Radiology I reviewed the image, agree with the interpretation  Procedures Procedures (including critical care time)  Initial Impression / Assessment and Plan / ED Course  I have reviewed the triage vital signs and the nursing notes.  Pertinent labs & imaging results that were available during my care of the patient were reviewed by me and considered in my medical decision making (see chart for details).  Well-appearing elderly female presents with isolated left proximal forearm pain. No elbow lesions, no wrist or shoulder lesions, and with no superficial changes about the tendon, there is little evidence for infection. Some suspicion for tendinitis or other inflammatory condition. With reassuring x-ray, the patient was discharged with a short course of steroids, sling, to follow-up with orthopedics for consideration of physical therapy, ongoing evaluation and management.   Final Clinical Impressions(s) / ED Diagnoses  Elbow pain, left, acute, initial encounter   Carmin Muskrat, MD 06/22/17 [phone]

## 2017-07-12 ENCOUNTER — Ambulatory Visit: Payer: Self-pay | Admitting: Family Medicine

## 2017-07-24 ENCOUNTER — Telehealth: Payer: Self-pay

## 2017-07-24 MED ORDER — VERAPAMIL HCL ER 360 MG PO CP24: 360.0000 mg | ORAL_CAPSULE | Freq: Every day | ORAL | 1 refills | Status: DC

## 2017-08-01 NOTE — Telephone Encounter (Signed)
PA has been submitted for Verapamil and needs further review. Will try back to tomorrow to see if it's approved

## 2017-08-02 NOTE — Telephone Encounter (Signed)
PA was denied for the Verapamil HCI ER 360MG .

## 2017-08-04 MED ORDER — NIFEDIPINE ER 60 MG PO TB24: 60.0000 mg | ORAL_TABLET | Freq: Every day | ORAL | 0 refills | Status: DC

## 2017-08-04 NOTE — Telephone Encounter (Signed)
Contact patient to advise verapamil is no longer covered by insurance. I am placing her on nifedipine 60 mg once daily and will need her to return in 2 weeks for a blood  pressure recheck.

## 2017-08-05 NOTE — Telephone Encounter (Signed)
Left a vm for patient to callback regarding medication  

## 2017-08-05 NOTE — Telephone Encounter (Signed)
Patient notified and will pick up new medication.  

## 2017-09-03 ENCOUNTER — Telehealth: Payer: Self-pay

## 2017-09-03 MED ORDER — PRAVASTATIN SODIUM 80 MG PO TABS: 80.0000 mg | ORAL_TABLET | Freq: Every day | ORAL | 1 refills | Status: DC

## 2017-09-03 MED ORDER — HYDROCHLOROTHIAZIDE 25 MG PO TABS: 25.0000 mg | ORAL_TABLET | Freq: Every day | ORAL | 1 refills | Status: DC

## 2017-10-01 IMAGING — RF DG FOOT COMPLETE 3+V*L*
1 series · 8 of 8 positions shown · non-contrast
Comparison: Left foot and left ankle series of July 24, 2016

CLINICAL DATA: Status post ORIF of left foot and ankle fractures.

EXAM:
LEFT ANKLE COMPLETE - 3+ VIEW; DG C-ARM 61-120 MIN; LEFT FOOT -
COMPLETE 3+ VIEW

[Series 1: run · 8 of 8 slices shown]
[im 1/8]
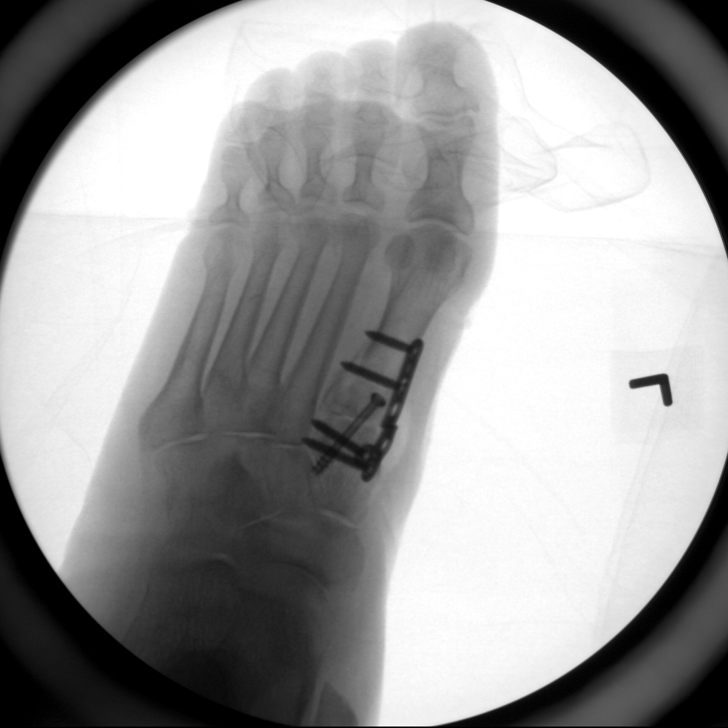
[im 2/8]
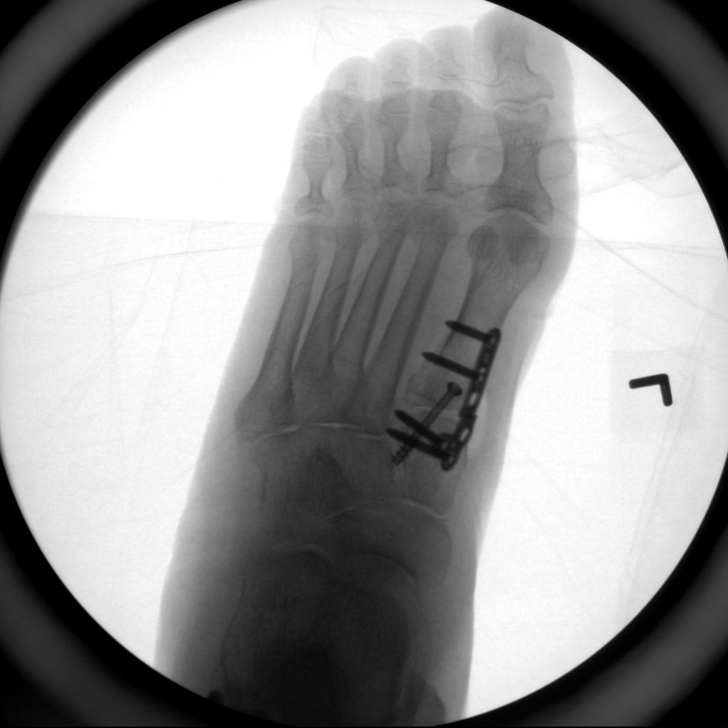
[im 3/8]
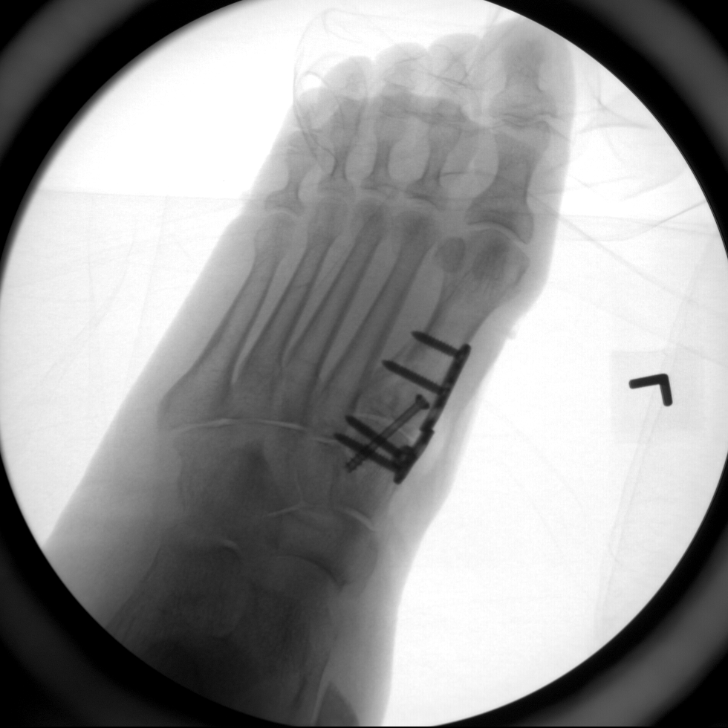
[im 4/8]
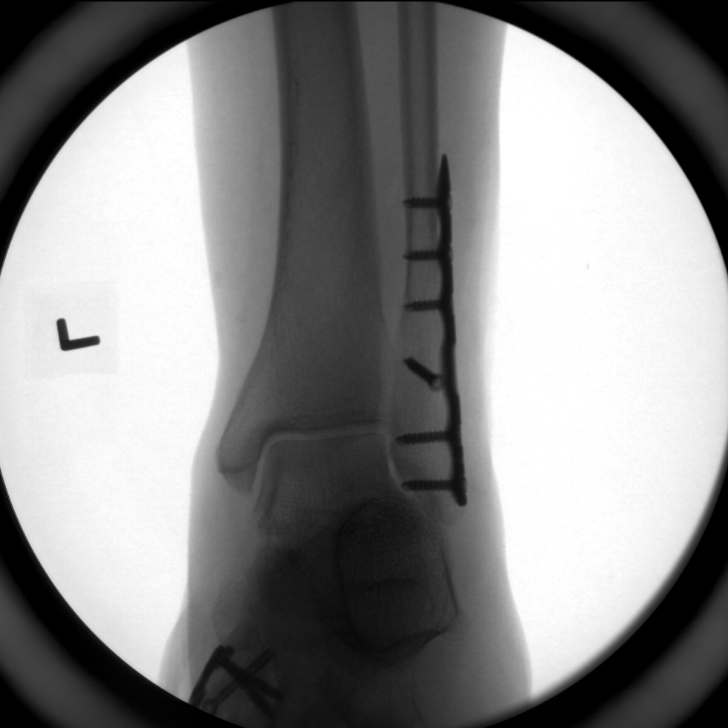
[im 5/8]
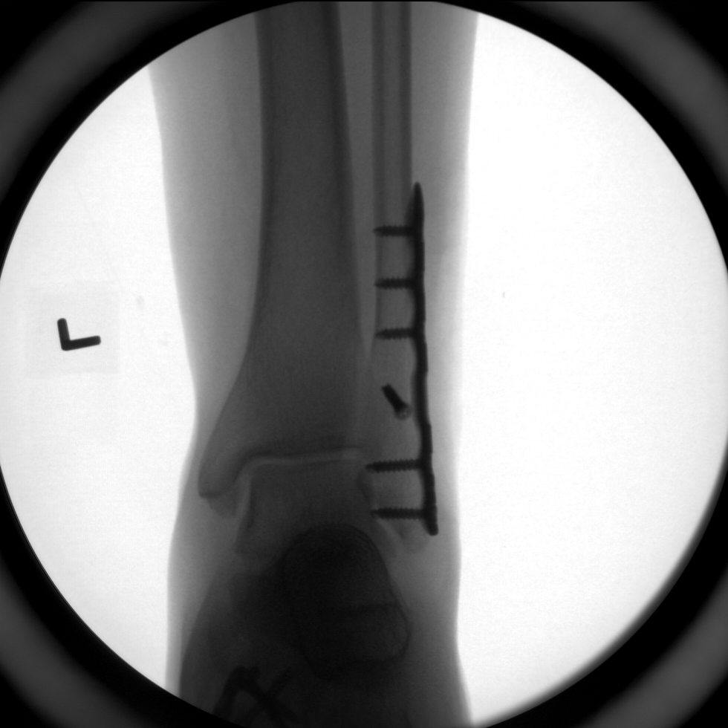
[im 6/8]
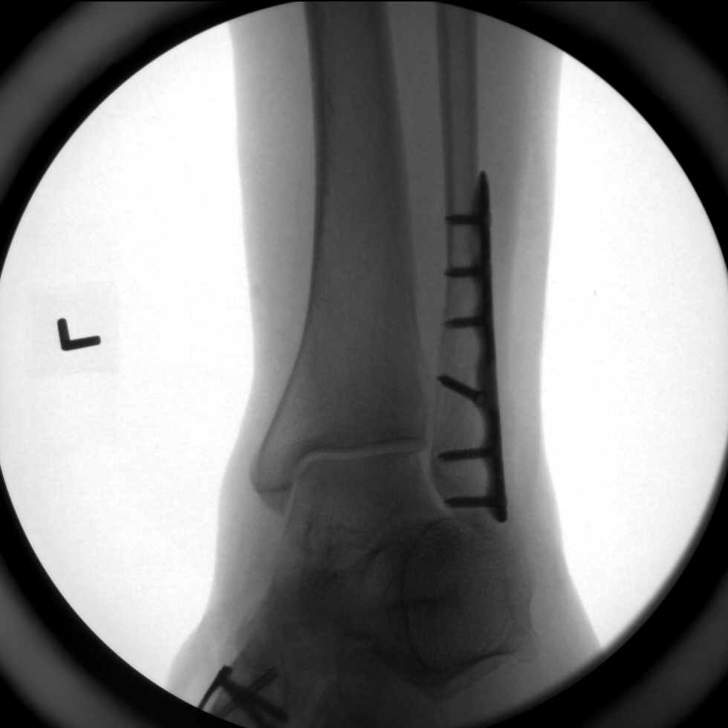
[im 7/8]
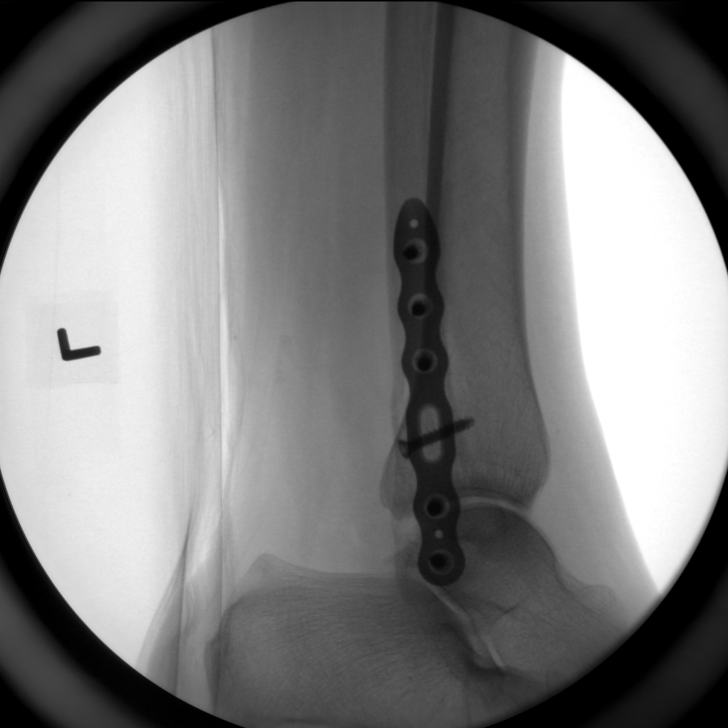
[im 8/8]
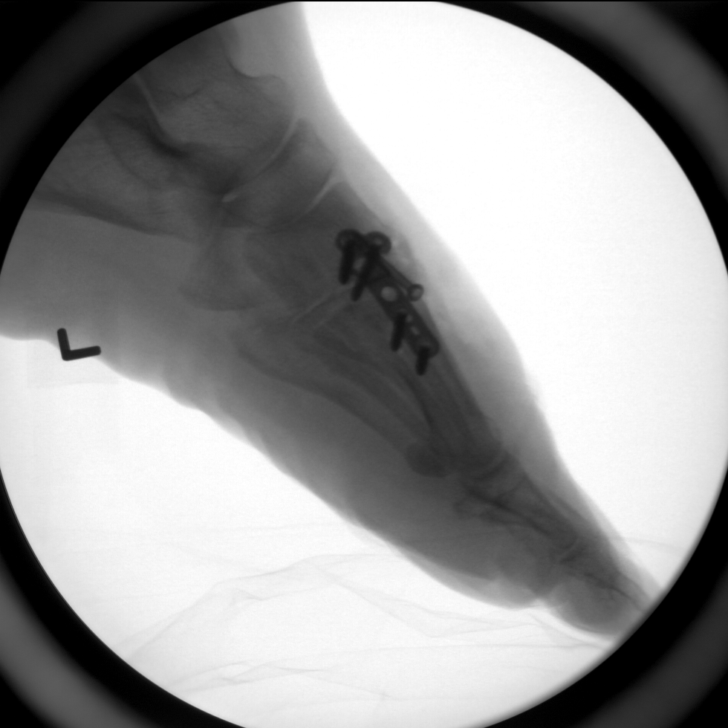

[8 of 8 positions shown; findings below may reference images not displayed]

FINDINGS: Eight fluoro spot images are submitted. Fluoro time reported is 1
minutes, 13 seconds.

The patient has undergone ORIF of a comminuted fracture of the base
of the first metatarsal. Screw and plate fixation and fusion across
the first TMT joint is present. Alignment is now near anatomic.
Projection of the first tarsometatarsal dislocation has occurred.

The patient has also undergone plate and screw fixation for a distal
fibular metadiaphyseal fracture. Alignment is now near anatomic.
IMPRESSION: The patient has undergone ORIF for fractures of the first metatarsal
and the distal fibular metadiaphysis on the left. No immediate
postprocedure complication is observed.

## 2017-10-08 ENCOUNTER — Ambulatory Visit: Payer: Medicaid Other | Admitting: Family Medicine

## 2017-10-08 ENCOUNTER — Telehealth: Payer: Self-pay

## 2017-10-09 MED ORDER — PRAVASTATIN SODIUM 80 MG PO TABS: 80.0000 mg | ORAL_TABLET | Freq: Every day | ORAL | 1 refills | Status: DC

## 2017-10-09 NOTE — Telephone Encounter (Signed)
Medication refilled

## 2017-10-19 ENCOUNTER — Emergency Department (HOSPITAL_COMMUNITY): Payer: Medicaid Other

## 2017-10-19 ENCOUNTER — Emergency Department (HOSPITAL_COMMUNITY)
Admission: EM | Admit: 2017-10-19 | Discharge: 2017-10-19 | Disposition: A | Discharge status: Home / Self Care | Payer: Medicaid Other | Attending: Emergency Medicine | Admitting: Emergency Medicine

## 2017-10-19 ENCOUNTER — Other Ambulatory Visit: Payer: Self-pay

## 2017-10-19 ENCOUNTER — Encounter (HOSPITAL_COMMUNITY): Payer: Self-pay | Admitting: *Deleted

## 2017-10-19 DIAGNOSIS — Z79899 Other long term (current) drug therapy: Secondary | ICD-10-CM | POA: Insufficient documentation

## 2017-10-19 DIAGNOSIS — Z87891 Personal history of nicotine dependence: Secondary | ICD-10-CM | POA: Diagnosis not present

## 2017-10-19 DIAGNOSIS — M25522 Pain in left elbow: Secondary | ICD-10-CM

## 2017-10-19 DIAGNOSIS — I10 Essential (primary) hypertension: Secondary | ICD-10-CM | POA: Diagnosis not present

## 2017-10-19 MED ORDER — PREDNISONE 20 MG PO TABS: 40.0000 mg | ORAL_TABLET | Freq: Every day | ORAL | 0 refills | Status: DC

## 2017-10-19 NOTE — ED Notes (Signed)
Pt c/o left elbow pain with no injury onset 1 week ago. She states that it is worse with movement. Hx of left elbow pain.

## 2017-10-19 NOTE — ED Provider Notes (Signed)
[facility] DEPT Provider Note   CSN: [id number] Arrival date & time: 10/19/17  8938     History   Chief Complaint Chief Complaint  Patient presents with  . Elbow Pain    left    HPI Teresa Collier is a 66 y.o. female presenting with L elbow pain.   Patient states that she has had left elbow pain for the past week.  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  She has been taking ibuprofen without improvement of pain.  She states this feels similar to when she had pain last month.  The pain last month was improved with steroids and a sling.  She never followed up with orthopedics.  She denies fall, trauma, or injury.  She denies numbness or tingling.  She denies pain elsewhere.  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  She denies pain in her shoulder or wrist.  She denies fevers, chills, or redness, or warmth of the elbow.  HPI  Past Medical History:  Diagnosis Date  . Blood transfusion without reported diagnosis    1973  . Cataract    "beginnings"  . Heart murmur   . Hyperlipidemia   . Hypertension     Patient Active Problem List   Diagnosis Date Noted  . Essential hypertension 10/16/2016  . Hyperlipidemia 10/16/2016  . Closed disp fracture of left lateral malleolus with routine healing 08/23/2016    Past Surgical History:  Procedure Laterality Date  . APPENDECTOMY    . BUNIONECTOMY    . COLONOSCOPY    . OPEN REDUCTION INTERNAL FIXATION (ORIF) FOOT LISFRANC FRACTURE Left 07/25/2016   Procedure: OPEN REDUCTION INTERNAL FIXATION (ORIF) FOOT LISFRANC FRACTURE;  Surgeon: Leandrew Koyanagi, MD;  Location: [facility];  Service: Orthopedics;  Laterality: Left;  . ORIF ANKLE FRACTURE Left 07/25/2016   Procedure: OPEN REDUCTION INTERNAL FIXATION (ORIF) LEFT BIMALLEOLAR  ANKLE AND LISFRANC FRACTURES;  Surgeon: Leandrew Koyanagi, MD;  Location: [facility];  Service: Orthopedics;  Laterality: Left;    OB History    No data available       Home Medications    Prior to Admission medications   Medication Sig Start Date End Date Taking? Authorizing Provider  fluticasone (FLONASE) 50 MCG/ACT nasal spray Place 2 sprays into both nostrils daily. 04/08/17   Scot Jun, FNP  hydrochlorothiazide (HYDRODIURIL) 25 MG tablet Take 1 tablet (25 mg total) daily by mouth. 09/03/17   Scot Jun, FNP  loratadine (CLARITIN) 10 MG tablet Take 1 tablet (10 mg total) by mouth daily. 04/08/17   Scot Jun, FNP  NIFEdipine (PROCARDIA-XL/ADALAT CC) 60 MG 24 hr tablet Take 1 tablet (60 mg total) by mouth daily. 08/04/17   Scot Jun, FNP  pravastatin (PRAVACHOL) 80 MG tablet Take 1 tablet (80 mg total) by mouth daily. 10/09/17   Scot Jun, FNP  predniSONE (DELTASONE) 20 MG tablet Take 2 tablets (40 mg total) by mouth daily with breakfast. For the next four days 10/19/17   Karrington Studnicka, PA-C    Family History Family History  Problem Relation Age of Onset  . Esophageal cancer Father   . Colon cancer Neg Hx   . Rectal cancer Neg Hx   . Stomach cancer Neg Hx     Social History Social History   Tobacco Use  . Smoking status: Former Smoker  Packs/day: 2.00    Years: 20.00    Pack years: 40.00    Types: Cigarettes    Last attempt to quit: 01/27/2009    Years since quitting: 8.7  . Smokeless tobacco: Never Used  Substance Use Topics  . Alcohol use: No    Comment: stopped drinking 2010  . Drug use: No     Allergies   Patient has no known allergies.   Review of Systems Review of Systems  Constitutional: Negative for chills and fever.  Musculoskeletal: Positive for arthralgias.  Neurological: Negative for numbness.  Hematological: Does not bruise/bleed easily.     Physical Exam Updated Vital Signs BP 136/87   Pulse 61   Temp 97.8 F (36.6 C) (Oral)   Resp 18   Ht 5\' 4"  (1.626  m)   Wt 86.2 kg (190 lb)   SpO2 99%   BMI 32.61 kg/m   Physical Exam  Constitutional: She is oriented to person, place, and time. She appears well-developed and well-nourished. No distress.  HENT:  Head: Normocephalic and atraumatic.  Eyes: EOM are normal.  Neck: Normal range of motion.  Cardiovascular: Normal rate, regular rhythm and intact distal pulses.  Pulmonary/Chest: Effort normal and breath sounds normal. No respiratory distress. She has no wheezes.  Abdominal: She exhibits no distension.  Musculoskeletal: She exhibits tenderness.  Tenderness palpation of the medial epicondyle of the elbow surrounding musculature.  No warmth, erythema, or streaking.  Full active range of motion with pain.  Radial pulses intact bilaterally.  Grip strength equal bilaterally.  Sensation intact bilaterally.  Soft compartments.  No obvious injury, swelling, or deformity.  Neurological: She is alert and oriented to person, place, and time. No sensory deficit.  Skin: Skin is warm. No rash noted.  Psychiatric: She has a normal mood and affect.  Nursing note and vitals reviewed.    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 FINDINGS: No fracture.  No bone lesion. The elbow joint is normally spaced and aligned. No arthropathic change. No joint effusion. Surrounding soft tissues are unremarkable. IMPRESSION: Negative. Electronically Signed   By: Lajean Manes M.D.   On: 10/19/2017 08:36    Procedures Procedures (including critical care  time)  Medications Ordered in ED Medications - No data to display   Initial Impression / Assessment and Plan / ED Course  I have reviewed the triage vital signs and the nursing notes.  Pertinent labs & imaging results that were available during my care of the patient were reviewed by me and considered in my medical decision making (see chart for details).     Patient presenting with left elbow pain.  History of similar last month.  Physical exam reassuring, she is neurovascularly intact.  Doubt infection/spetic joint. Will obtain xrays per pt request.   Xray negative for fracture, dislocation, or other acute abnormality.  Likely muscular/tendon etiology.  Will treat with sling and course of steroids as she had last time, and encouraged follow-up with  orthopedics.  At this time, patient appears safe for discharge.  Return precautions given.  Patient states she understands and agrees to plan.   Final Clinical Impressions(s) / ED Diagnoses   Final diagnoses:  Left elbow pain    ED Discharge Orders        Ordered    predniSONE (DELTASONE) 20 MG tablet  Daily with breakfast     10/19/17 0855       Franchot Heidelberg, PA-C 10/19/17 [place], [place], DO 10/20/17 [phone]

## 2017-10-19 NOTE — ED Triage Notes (Signed)
Pt complains of left elbow pain for the past week. Pain is worse with movement. Pt denies injury to arm. 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 Pt has been taking ibuprofen for pain.

## 2017-10-19 NOTE — ED Notes (Signed)
Sling arm foam apply to pt left elbow.

## 2017-10-19 NOTE — Discharge Instructions (Signed)
Take prednisone as prescribed.  Do not take anti-inflammatories while taking this medicine (Advil, Motrin, naproxen, Aleve, ibuprofen).  Take Tylenol instead as needed for pain. Use the sling for comfort. Follow-up with orthopedic doctor for further evaluation and management of your elbow pain. Return to the emergency room if you develop fevers, new numbness, or any new or concerning symptoms.

## 2017-10-28 ENCOUNTER — Encounter (HOSPITAL_COMMUNITY): Payer: Self-pay

## 2017-10-28 ENCOUNTER — Other Ambulatory Visit: Payer: Self-pay

## 2017-10-28 ENCOUNTER — Emergency Department (HOSPITAL_COMMUNITY)
Admission: EM | Admit: 2017-10-28 | Discharge: 2017-10-28 | Disposition: A | Discharge status: Home / Self Care | Payer: Medicaid Other | Attending: Emergency Medicine | Admitting: Emergency Medicine

## 2017-10-28 DIAGNOSIS — M25522 Pain in left elbow: Secondary | ICD-10-CM | POA: Diagnosis not present

## 2017-10-28 DIAGNOSIS — Z87891 Personal history of nicotine dependence: Secondary | ICD-10-CM | POA: Diagnosis not present

## 2017-10-28 DIAGNOSIS — G8929 Other chronic pain: Secondary | ICD-10-CM

## 2017-10-28 DIAGNOSIS — Z79899 Other long term (current) drug therapy: Secondary | ICD-10-CM | POA: Diagnosis not present

## 2017-10-28 DIAGNOSIS — I10 Essential (primary) hypertension: Secondary | ICD-10-CM | POA: Insufficient documentation

## 2017-10-28 MED ORDER — HYDROCODONE-ACETAMINOPHEN 5-325 MG PO TABS: 1.0000 | ORAL_TABLET | Freq: Four times a day (QID) | ORAL | 0 refills | Status: DC | PRN

## 2017-10-28 MED ORDER — HYDROCODONE-ACETAMINOPHEN 5-325 MG PO TABS: 2.0000 | ORAL_TABLET | ORAL | 0 refills | Status: DC | PRN

## 2017-10-28 NOTE — ED Triage Notes (Signed)
Pt c/o worsening left elbow pain. Pain is worse with movement. Pt seen here for the same 12/22. Pt reports OTC and Rx provide no relief.

## 2017-10-28 NOTE — ED Notes (Signed)
ED Provider at bedside. 

## 2017-10-28 NOTE — ED Provider Notes (Addendum)
[facility] DEPT Provider Note   CSN: [id number] Arrival date & time: 10/28/17  0559     History   Chief Complaint Chief Complaint  Patient presents with  . Arm Pain    Left    HPI Teresa Collier is a 66 y.o. female.  HPI Planes of pain at posterior left elbow full 1 year intermittent pain is worse with moving her elbow in any direction and improved with remaining still.  She was seen here on 10/19/2017 for same complaint prescribed prednisone without relief.  She had x-ray on 1222 which was negative she denies fever denies injury, denies other complaint. Past Medical History:  Diagnosis Date  . Blood transfusion without reported diagnosis    1973  . Cataract    "beginnings"  . Heart murmur   . Hyperlipidemia   . Hypertension     Patient Active Problem List   Diagnosis Date Noted  . Essential hypertension 10/16/2016  . Hyperlipidemia 10/16/2016  . Closed disp fracture of left lateral malleolus with routine healing 08/23/2016    Past Surgical History:  Procedure Laterality Date  . APPENDECTOMY    . BUNIONECTOMY    . COLONOSCOPY    . OPEN REDUCTION INTERNAL FIXATION (ORIF) FOOT LISFRANC FRACTURE Left 07/25/2016   Procedure: OPEN REDUCTION INTERNAL FIXATION (ORIF) FOOT LISFRANC FRACTURE;  Surgeon: Leandrew Koyanagi, MD;  Location: [facility];  Service: Orthopedics;  Laterality: Left;  . ORIF ANKLE FRACTURE Left 07/25/2016   Procedure: OPEN REDUCTION INTERNAL FIXATION (ORIF) LEFT BIMALLEOLAR  ANKLE AND LISFRANC FRACTURES;  Surgeon: Leandrew Koyanagi, MD;  Location: [facility];  Service: Orthopedics;  Laterality: Left;    OB History    No data available       Home Medications    Prior to Admission medications   Medication Sig Start Date End Date Taking? Authorizing Provider  fluticasone (FLONASE) 50 MCG/ACT nasal spray Place 2 sprays into both nostrils daily. 04/08/17   Scot Jun, FNP    hydrochlorothiazide (HYDRODIURIL) 25 MG tablet Take 1 tablet (25 mg total) daily by mouth. 09/03/17   Scot Jun, FNP  loratadine (CLARITIN) 10 MG tablet Take 1 tablet (10 mg total) by mouth daily. 04/08/17   Scot Jun, FNP  NIFEdipine (PROCARDIA-XL/ADALAT CC) 60 MG 24 hr tablet Take 1 tablet (60 mg total) by mouth daily. 08/04/17   Scot Jun, FNP  pravastatin (PRAVACHOL) 80 MG tablet Take 1 tablet (80 mg total) by mouth daily. 10/09/17   Scot Jun, FNP  predniSONE (DELTASONE) 20 MG tablet Take 2 tablets (40 mg total) by mouth daily with breakfast. For the next four days 10/19/17   Caccavale, Sophia, PA-C    Family History Family History  Problem Relation Age of Onset  . Esophageal cancer Father   . Colon cancer Neg Hx   . Rectal cancer Neg Hx   . Stomach cancer Neg Hx     Social History Social History   Tobacco Use  . Smoking status: Former Smoker    Packs/day: 2.00    Years: 20.00    Pack years: 40.00    Types: Cigarettes    Last attempt to quit: 01/27/2009    Years since quitting: 8.7  . Smokeless tobacco: Never Used  Substance Use Topics  . Alcohol use: No    Comment: stopped drinking 2010  . Drug use: No     Allergies   Patient has no known allergies.  Review of Systems Review of Systems  Constitutional: Negative.   Musculoskeletal: Positive for arthralgias.       Left elbow pain     Physical Exam Updated Vital Signs BP 135/74 (BP Location: Right Arm)   Pulse 92   Temp 98.2 F (36.8 C) (Oral)   Resp 18   SpO2 95%   Physical Exam  Constitutional: She appears well-developed and well-nourished. No distress.  HENT:  Head: Normocephalic and atraumatic.  Eyes: Conjunctivae are normal. Pupils are equal, round, and reactive to light.  Neck: Neck supple. No tracheal deviation present. No thyromegaly present.  Cardiovascular: Normal rate and regular rhythm.  No murmur heard. Pulmonary/Chest: Effort normal and breath sounds  normal.  Abdominal: Soft. Bowel sounds are normal. She exhibits no distension. There is no tenderness.  Obese  Musculoskeletal: Normal range of motion. She exhibits no edema or tenderness.  Upper extremity without redness swelling or point tenderness.  Full range of motion with pain at elbow on full flexion.  Radial pulse 2+.  Good capillary refill.  All other extremities without redness swelling or tenderness neurovascularly intact  Neurological: She is alert. Coordination normal.  Skin: Skin is warm and dry. No rash noted.  Psychiatric: She has a normal mood and affect.  Nursing note and vitals reviewed.    ED Treatments / Results  Labs (all labs ordered are listed, but only abnormal results are displayed) Labs Reviewed - No data to display  EKG  EKG Interpretation None       Radiology No results found.  Procedures Procedures (including critical care time)  Medications Ordered in ED Medications - No data to display   Initial Impression / Assessment and Plan / ED Course  I have reviewed the triage vital signs and the nursing notes.  Pertinent labs & imaging results that were available during my care of the patient were reviewed by me and considered in my medical decision making (see chart for details).     Plan prescription Norco dispense 10 tablets.  I had inadvertently written 2 prescriptions first of which was shredded.  Follow-up with PMD [facility] Controlled Substance reporting System queried Final Clinical Impressions(s) / ED Diagnoses  Diagnosis chronic left elbow pain Final diagnoses:  None    ED Discharge Orders    None       Orlie Dakin, MD 10/28/17 [place], [place], MD 10/28/17 [phone]

## 2017-10-28 NOTE — Discharge Instructions (Signed)
Take Tylenol for mild pain or the pain medicine prescribed for bad pain do not take Tylenol together with the pain medicine prescribed as the combination can be dangerous to your liver.  Call your primary care physician to be seen sooner than your appointment on November 08, 2017 if your pain is not well controlled with the medication prescribed

## 2017-10-28 NOTE — ED Notes (Signed)
Patient states pain is at a 79/15 with certain movements but doesn't hurt at resting.

## 2017-11-08 ENCOUNTER — Ambulatory Visit: Payer: Medicaid Other | Admitting: Family Medicine

## 2017-11-11 ENCOUNTER — Other Ambulatory Visit: Payer: Self-pay | Admitting: Family Medicine

## 2017-12-11 ENCOUNTER — Ambulatory Visit (INDEPENDENT_AMBULATORY_CARE_PROVIDER_SITE_OTHER): Payer: Medicaid Other | Admitting: Family Medicine

## 2017-12-11 ENCOUNTER — Encounter: Payer: Self-pay | Admitting: Family Medicine

## 2017-12-11 VITALS — BP 124/68 | HR 90 | Temp 98.7°F | Resp 14 | Ht 64.0 in | Wt 199.6 lb

## 2017-12-11 DIAGNOSIS — M25522 Pain in left elbow: Secondary | ICD-10-CM | POA: Diagnosis not present

## 2017-12-11 DIAGNOSIS — Z131 Encounter for screening for diabetes mellitus: Secondary | ICD-10-CM

## 2017-12-11 DIAGNOSIS — R7303 Prediabetes: Secondary | ICD-10-CM

## 2017-12-11 DIAGNOSIS — E785 Hyperlipidemia, unspecified: Secondary | ICD-10-CM

## 2017-12-11 DIAGNOSIS — J069 Acute upper respiratory infection, unspecified: Secondary | ICD-10-CM | POA: Diagnosis not present

## 2017-12-11 DIAGNOSIS — I10 Essential (primary) hypertension: Secondary | ICD-10-CM

## 2017-12-11 DIAGNOSIS — Z23 Encounter for immunization: Secondary | ICD-10-CM | POA: Diagnosis not present

## 2017-12-11 DIAGNOSIS — G8929 Other chronic pain: Secondary | ICD-10-CM

## 2017-12-11 LAB — POCT URINALYSIS DIP (DEVICE)
Bilirubin Urine: NEGATIVE
Glucose, UA: NEGATIVE mg/dL
Hgb urine dipstick: NEGATIVE
Ketones, ur: NEGATIVE mg/dL
Nitrite: NEGATIVE
Protein, ur: NEGATIVE mg/dL
Specific Gravity, Urine: >=1.03 (ref 1.005–1.030)
Urobilinogen, UA: 0.2 mg/dL (ref 0.0–1.0)
pH: 5.5 (ref 5.0–8.0)

## 2017-12-11 LAB — POCT GLYCOSYLATED HEMOGLOBIN (HGB A1C): Hemoglobin A1C: 5.9

## 2017-12-11 MED ORDER — NIFEDIPINE ER OSMOTIC RELEASE 60 MG PO TB24: 60.0000 mg | ORAL_TABLET | Freq: Every day | ORAL | 1 refills | Status: DC

## 2017-12-11 MED ORDER — LORATADINE 10 MG PO TABS: 10.0000 mg | ORAL_TABLET | Freq: Every day | ORAL | 1 refills | Status: DC

## 2017-12-11 MED ORDER — HYDROCHLOROTHIAZIDE 25 MG PO TABS: 25.0000 mg | ORAL_TABLET | Freq: Every day | ORAL | 1 refills | Status: DC

## 2017-12-11 MED ORDER — BENZONATATE 100 MG PO CAPS: 100.0000 mg | ORAL_CAPSULE | Freq: Three times a day (TID) | ORAL | 0 refills | Status: DC | PRN

## 2017-12-11 NOTE — Progress Notes (Signed)
Patient ID: Teresa Collier, female    DOB: Mar 12, 1951, 67 y.o.   MRN: [id number]  PCP: Scot Jun, FNP  Chief Complaint  Patient presents with  . Follow-up    6 month on HTN  . Cough  . Nasal Congestion  . Arm Pain    Subjective:  HPI Teresa Collier is a 67 y.o. female with hypertension, obesity, presents for evaluation of  chronic condition, and complaints of URI symptoms and arm pain.  HTN Teresa Collier reports occasional reports efforts to adhere to low-sodium diet home monitoring of blood pressure.  However admits to occasional intake of foods rich in sodium. She reports no routine physical activity.  Current Body mass index is 34.26 kg/m. She is a former smoker. Consistently takes prescribe medications as prescribed. Reports efforts to adhere he is a nonsmoker. Denies any episodes of dizziness, headaches, shortness of breath, wheezing, LE edema, or chest pain.  URI Teresa Collier reports nasal congestion and drainage with a non-productive cough on-going x 3 days. She has remained afebrile, negative of headache, and sore throat. No recent influenza exposures. She has not attempted relief of symptoms with any OTC medication.   Elbow pain, Recurring for over 1 year. No prior injury. Reports repetitive twisting of left arm and has noticed pain is most prominent while at work. She has attempted relief with ibuprofen with improvement She denies numbness or tingling of left arm, sharp or shooting pains, or weakness.  Social History   Socioeconomic History  . Marital status: Married    Spouse name: Not on file  . Number of children: Not on file  . Years of education: Not on file  . Highest education level: Not on file  Social Needs  . Financial resource strain: Not on file  . Food insecurity - worry: Not on file  . Food insecurity - inability: Not on file  . Transportation needs - medical: Not on file  . Transportation needs - non-medical: Not on file  Occupational  History  . Not on file  Tobacco Use  . Smoking status: Former Smoker    Packs/day: 2.00    Years: 20.00    Pack years: 40.00    Types: Cigarettes    Last attempt to quit: 01/27/2009    Years since quitting: 8.8  . Smokeless tobacco: Never Used  Substance and Sexual Activity  . Alcohol use: No    Comment: stopped drinking 2010  . Drug use: No  . Sexual activity: Not on file  Other Topics Concern  . Not on file  Social History Narrative  . Not on file    Family History  Problem Relation Age of Onset  . Esophageal cancer Father   . Colon cancer Neg Hx   . Rectal cancer Neg Hx   . Stomach cancer Neg Hx     Review of Systems  Constitutional: Negative.   HENT: Positive for congestion.   Respiratory: Positive for cough.   Genitourinary: Negative.   Musculoskeletal: Positive for arthralgias.  Neurological: Negative.   Psychiatric/Behavioral: Negative.      Patient Active Problem List   Diagnosis Date Noted  . Essential hypertension 10/16/2016  . Hyperlipidemia 10/16/2016  . Closed disp fracture of left lateral malleolus with routine healing 08/23/2016    No Known Allergies  Prior to Admission medications   Medication Sig Start Date End Date Taking? Authorizing Provider  fluticasone (FLONASE) 50 MCG/ACT nasal spray Place 2 sprays into both nostrils daily. 04/08/17  Yes Anastasya Jewell,  Carroll Sage, FNP  hydrochlorothiazide (HYDRODIURIL) 25 MG tablet Take 1 tablet (25 mg total) daily by mouth. 09/03/17  Yes Scot Jun, FNP  loratadine (CLARITIN) 10 MG tablet Take 1 tablet (10 mg total) by mouth daily. 04/08/17  Yes Scot Jun, FNP  NIFEdipine (PROCARDIA XL/ADALAT-CC) 60 MG 24 hr tablet TAKE 1 TABLET BY MOUTH ONCE DAILY 11/11/17  Yes Scot Jun, FNP  NIFEdipine (PROCARDIA-XL/ADALAT CC) 60 MG 24 hr tablet Take 1 tablet (60 mg total) by mouth daily. 08/04/17  Yes Scot Jun, FNP  pravastatin (PRAVACHOL) 80 MG tablet Take 1 tablet (80 mg total) by mouth daily.  10/09/17  Yes Scot Jun, FNP  HYDROcodone-acetaminophen (NORCO) 5-325 MG tablet Take 2 tablets by mouth every 4 (four) hours as needed. Patient not taking: Reported on 12/11/2017 10/28/17   Orlie Dakin, MD  HYDROcodone-acetaminophen (NORCO) 5-325 MG tablet Take 1 tablet by mouth every 6 (six) hours as needed for severe pain. Patient not taking: Reported on 12/11/2017 10/28/17   Orlie Dakin, MD  predniSONE (DELTASONE) 20 MG tablet Take 2 tablets (40 mg total) by mouth daily with breakfast. For the next four days Patient not taking: Reported on 12/11/2017 10/19/17   Franchot Heidelberg, PA-C     Past Medical, Surgical Family and Social History reviewed and updated.    Objective:   Today's Vitals   12/11/17 0809  BP: 124/68  Pulse: 90  Resp: 14  Temp: 98.7 F (37.1 C)  TempSrc: Oral  SpO2: 99%  Weight: 199 lb 9.6 oz (90.5 kg)  Height: 5\' 4"  (1.626 m)    Wt Readings from Last 3 Encounters:  12/11/17 199 lb 9.6 oz (90.5 kg)  10/19/17 190 lb (86.2 kg)  04/08/17 193 lb (87.5 kg)    Physical Exam  Constitutional: She is oriented to person, place, and time. She appears well-developed and well-nourished.  HENT:  Head: Normocephalic and atraumatic.  Right Ear: Hearing and tympanic membrane normal.  Left Ear: Hearing, tympanic membrane, external ear and ear canal normal.  Nose: Rhinorrhea present.  Mouth/Throat: Uvula is midline and oropharynx is clear and moist.  Cardiovascular: Normal rate, regular rhythm, normal heart sounds and intact distal pulses.  Pulmonary/Chest: Effort normal and breath sounds normal.  Abdominal: Soft. Bowel sounds are normal. She exhibits no distension and no mass. There is no tenderness. There is no rebound and no guarding.  Musculoskeletal:       Left elbow: She exhibits normal range of motion and no swelling. Tenderness found. Lateral epicondyle tenderness noted.  Neurological: She is alert and oriented to person, place, and time. She has  normal reflexes.  Skin: Skin is warm and dry.  Psychiatric: She has a normal mood and affect. Her behavior is normal. Judgment and thought content normal.  Nursing note reviewed.   Assessment & Plan:  1. Prediabetes, 5.9, counseled improving dietary choices, increasing physical activity with a goal of 150 minutes/week to facilitate better glycemic control and improve efforts for weight loss.  No medication therapy will be indicated at this time however reinforced to  patient that lifestyle changes are necessary in order to avoid becoming a diabetic.  2. Upper respiratory tract infection, unspecified type, antibiotic therapy are not indicated as symptoms are likely associated with a viral infection. Patient is non-ill appearing and afebrile, low suspicious for influenza. Recommended OTC antihistamines and prescribing benzonatate 100-200 mg up 3 times daily.    3. 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 Recommended conservative treatment with elbow compression brace, warm compresses, and acetaminophen   4. Essential hypertension, stable-well-controlled  We have discussed target BP range and blood pressure goal. I have advised patient to check BP regularly and to call us back or report to clinic if the numbers are consistently higher than 140/90. We discussed the importance of compliance with medical therapy and DASH diet recommended, consequences of uncontrolled hypertension discussed. Continue current BP medications  5. Hyperlipidemia, unspecified hyperlipidemia type The 10-year ASCVD risk score Mikey Bussing DC Brooke Bonito., et al., 2013) is: 7.5%   Values used to calculate the score:     Age: 26 years     Sex: Female     Is Non-Hispanic African American: Yes     Diabetic: No     Tobacco smoker: No     Systolic Blood Pressure: 650 mmHg     Is BP treated: Yes     HDL Cholesterol: 51 mg/dL      Total Cholesterol: 182 mg/dL -  6. Need for immunization against influenza- Flu Vaccine QUAD 36+ mos IM    Meds ordered this encounter  Medications  . benzonatate (TESSALON) 100 MG capsule    Sig: Take 1-2 capsules (100-200 mg total) by mouth 3 (three) times daily as needed for cough.    Dispense:  40 capsule    Refill:  0    Order Specific Question:   Supervising Provider    Answer:   Tresa Garter [id number]  . hydrochlorothiazide (HYDRODIURIL) 25 MG tablet    Sig: Take 1 tablet (25 mg total) by mouth daily.    Dispense:  90 tablet    Refill:  1    Order Specific Question:   Supervising Provider    Answer:   Tresa Garter [id number]  . DISCONTD: NIFEdipine (PROCARDIA XL/ADALAT-CC) 60 MG 24 hr tablet    Sig: Take 1 tablet (60 mg total) by mouth daily.    Dispense:  90 tablet    Refill:  1    Patient needs appointment    Order Specific Question:   Supervising Provider    Answer:   Tresa Garter [id number]  . loratadine (CLARITIN) 10 MG tablet    Sig: Take 1 tablet (10 mg total) by mouth daily.    Dispense:  90 tablet    Refill:  1    Order Specific Question:   Supervising Provider    Answer:   Tresa Garter [id number]  . NIFEdipine (PROCARDIA XL/ADALAT-CC) 60 MG 24 hr tablet    Sig: Take 1 tablet (60 mg total) by mouth daily.    Dispense:  90 tablet    Refill:  1    Order Specific Question:   Supervising Provider    Answer:   Tresa Garter [id number]  . pravastatin (PRAVACHOL) 80 MG tablet    Sig: Take 1 tablet (80 mg total) by mouth daily.    Dispense:  90 tablet    Refill:  1    Order Specific Question:   Supervising Provider    Answer:   Tresa Garter [id number]     RTC: 6 months for chronic condition management.   Carroll Sage. Kenton Kingfisher, MSN, FNP-C [facility]  [address] C[address] [place], [place] [address] [phone]

## 2017-12-11 NOTE — Patient Instructions (Signed)
Incorporate routine physical activity with a goal of 150 minutes per week.  For cough, I have sent over benzonatate tablets, take 100-200 mg take up to 3 times day.   I have refilled your medications.       Hypertension Hypertension is another name for high blood pressure. High blood pressure forces your heart to work harder to pump blood. This can cause problems over time. There are two numbers in a blood pressure reading. There is a top number (systolic) over a bottom number (diastolic). It is best to have a blood pressure below 120/80. Healthy choices can help lower your blood pressure. You may need medicine to help lower your blood pressure if:  Your blood pressure cannot be lowered with healthy choices.  Your blood pressure is higher than 130/80.  Follow these instructions at home: Eating and drinking  If directed, follow the DASH eating plan. This diet includes: ? Filling half of your plate at each meal with fruits and vegetables. ? Filling one quarter of your plate at each meal with whole grains. Whole grains include whole wheat pasta, brown rice, and whole grain bread. ? Eating or drinking low-fat dairy products, such as skim milk or low-fat yogurt. ? Filling one quarter of your plate at each meal with low-fat (lean) proteins. Low-fat proteins include fish, skinless chicken, eggs, beans, and tofu. ? Avoiding fatty meat, cured and processed meat, or chicken with skin. ? Avoiding premade or processed food.  Eat less than 1,500 mg of salt (sodium) a day.  Limit alcohol use to no more than 1 drink a day for nonpregnant women and 2 drinks a day for men. One drink equals 12 oz of beer, 5 oz of wine, or 1 oz of hard liquor. Lifestyle  Work with your doctor to stay at a healthy weight or to lose weight. Ask your doctor what the best weight is for you.  Get at least 30 minutes of exercise that causes your heart to beat faster (aerobic exercise) most days of the week. This may  include walking, swimming, or biking.  Get at least 30 minutes of exercise that strengthens your muscles (resistance exercise) at least 3 days a week. This may include lifting weights or pilates.  Do not use any products that contain nicotine or tobacco. This includes cigarettes and e-cigarettes. If you need help quitting, ask your doctor.  Check your blood pressure at home as told by your doctor.  Keep all follow-up visits as told by your doctor. This is important. Medicines  Take over-the-counter and prescription medicines only as told by your doctor. Follow directions carefully.  Do not skip doses of blood pressure medicine. The medicine does not work as well if you skip doses. Skipping doses also puts you at risk for problems.  Ask your doctor about side effects or reactions to medicines that you should watch for. Contact a doctor if:  You think you are having a reaction to the medicine you are taking.  You have headaches that keep coming back (recurring).  You feel dizzy.  You have swelling in your ankles.  You have trouble with your vision. Get help right away if:  You get a very bad headache.  You start to feel confused.  You feel weak or numb.  You feel faint.  You get very bad pain in your: ? Chest. ? Belly (abdomen).  You throw up (vomit) more than once.  You have trouble breathing. Summary  Hypertension is another name for  high blood pressure.  Making healthy choices can help lower blood pressure. If your blood pressure cannot be controlled with healthy choices, you may need to take medicine. This information is not intended to replace advice given to you by your health care provider. Make sure you discuss any questions you have with your health care provider. Document Released: 04/02/2008 Document Revised: 09/12/2016 Document Reviewed: 09/12/2016 Elsevier Interactive Patient Education  2018 [employer].    Prediabetes Prediabetes is the condition  of having a blood sugar (blood glucose) level that is higher than it should be, but not high enough for you to be diagnosed with type 2 diabetes. Having prediabetes puts you at risk for developing type 2 diabetes (type 2 diabetes mellitus). Prediabetes may be called impaired glucose tolerance or impaired fasting glucose. Prediabetes usually does not cause symptoms. Your health care provider can diagnose this condition with blood tests. You may be tested for prediabetes if you are overweight and if you have at least one other risk factor for prediabetes. Risk factors for prediabetes include:  Having a family member with type 2 diabetes.  Being overweight or obese.  Being older than age 107.  Being of American-Indian, African-American, Hispanic/Latino, or Asian/Pacific Islander descent.  Having an inactive (sedentary) lifestyle.  Having a history of gestational diabetes or polycystic ovarian syndrome (PCOS).  Having low levels of good cholesterol (HDL-C) or high levels of blood fats (triglycerides).  Having high blood pressure.  What is blood glucose and how is blood glucose measured?  Blood glucose refers to the amount of glucose in your bloodstream. Glucose comes from eating foods that contain sugars and starches (carbohydrates) that the body breaks down into glucose. Your blood glucose level may be measured in mg/dL (milligrams per deciliter) or mmol/L (millimoles per liter).Your blood glucose may be checked with one or more of the following blood tests:  A fasting blood glucose (FBG) test. You will not be allowed to eat (you will fast) for at least 8 hours before a blood sample is taken. ? A normal range for FBG is 70-100 mg/dl (3.9-5.6 mmol/L).  An A1c (hemoglobin A1c) blood test. This test provides information about blood glucose control over the previous 2?45months.  An oral glucose tolerance test (OGTT). This test measures your blood glucose twice: ? After fasting. This is your  baseline level. ? Two hours after you drink a beverage that contains glucose.  You may be diagnosed with prediabetes:  If your FBG is 100?125 mg/dL (5.6-6.9 mmol/L).  If your A1c level is 5.7?6.4%.  If your OGGT result is 140?199 mg/dL (7.8-11 mmol/L).  These blood tests may be repeated to confirm your diagnosis. What happens if blood glucose is too high? The pancreas produces a hormone (insulin) that helps move glucose from the bloodstream into cells. When cells in the body do not respond properly to insulin that the body makes (insulin resistance), excess glucose builds up in the blood instead of going into cells. As a result, high blood glucose (hyperglycemia) can develop, which can cause many complications. This is a symptom of prediabetes. What can happen if blood glucose stays higher than normal for a long time? Having high blood glucose for a long time is dangerous. Too much glucose in your blood can damage your nerves and blood vessels. Long-term damage can lead to complications from diabetes, which may include:  Heart disease.  Stroke.  Blindness.  Kidney disease.  Depression.  Poor circulation in the feet and legs, which could lead  to surgical removal (amputation) in severe cases.  How can prediabetes be prevented from turning into type 2 diabetes?  To help prevent type 2 diabetes, take the following actions:  Be physically active. ? Do moderate-intensity physical activity for at least 30 minutes on at least 5 days of the week, or as much as told by your health care provider. This could be brisk walking, biking, or water aerobics. ? Ask your health care provider what activities are safe for you. A mix of physical activities may be best, such as walking, swimming, cycling, and strength training.  Lose weight as told by your health care provider. ? Losing 5-7% of your body weight can reverse insulin resistance. ? Your health care provider can determine how much weight  loss is best for you and can help you lose weight safely.  Follow a healthy meal plan. This includes eating lean proteins, complex carbohydrates, fresh fruits and vegetables, low-fat dairy products, and healthy fats. ? Follow instructions from your health care provider about eating or drinking restrictions. ? Make an appointment to see a diet and nutrition specialist (registered dietitian) to help you create a healthy eating plan that is right for you.  Do not smoke or use any tobacco products, such as cigarettes, chewing tobacco, and e-cigarettes. If you need help quitting, ask your health care provider.  Take over-the-counter and prescription medicines as told by your health care provider. You may be prescribed medicines that help lower the risk of type 2 diabetes.  This information is not intended to replace advice given to you by your health care provider. Make sure you discuss any questions you have with your health care provider. Document Released: 02/06/2016 Document Revised: 03/22/2016 Document Reviewed: 12/06/2015 Elsevier Interactive Patient Education  [employer].

## 2017-12-12 LAB — THYROID PANEL WITH TSH
FREE THYROXINE INDEX: 1.6 (ref 1.2–4.9)
T3 Uptake Ratio: 23 % — ABNORMAL LOW (ref 24–39)
T4, Total: 6.8 ug/dL (ref 4.5–12.0)
TSH: 2.3 u[IU]/mL (ref 0.450–4.500)

## 2017-12-12 LAB — COMPREHENSIVE METABOLIC PANEL
A/G RATIO: 1.6 (ref 1.2–2.2)
ALBUMIN: 4.4 g/dL (ref 3.6–4.8)
ALT: 10 IU/L (ref 0–32)
AST: 11 IU/L (ref 0–40)
Alkaline Phosphatase: 118 IU/L — ABNORMAL HIGH (ref 39–117)
BILIRUBIN TOTAL: 0.3 mg/dL (ref 0.0–1.2)
BUN / CREAT RATIO: 15 (ref 12–28)
BUN: 13 mg/dL (ref 8–27)
CHLORIDE: 101 mmol/L (ref 96–106)
CO2: 25 mmol/L (ref 20–29)
Calcium: 10.5 mg/dL — ABNORMAL HIGH (ref 8.7–10.3)
Creatinine, Ser: 0.88 mg/dL (ref 0.57–1.00)
GFR calc non Af Amer: 70 mL/min/{1.73_m2} (ref >59)
GFR, EST AFRICAN AMERICAN: 80 mL/min/{1.73_m2} (ref >59)
Globulin, Total: 2.7 g/dL (ref 1.5–4.5)
Glucose: 95 mg/dL (ref 65–99)
POTASSIUM: 4 mmol/L (ref 3.5–5.2)
Sodium: 144 mmol/L (ref 134–144)
TOTAL PROTEIN: 7.1 g/dL (ref 6.0–8.5)

## 2017-12-12 LAB — LIPID PANEL
Chol/HDL Ratio: 3.6 ratio (ref 0.0–4.4)
Cholesterol, Total: 182 mg/dL (ref 100–199)
HDL: 51 mg/dL (ref >39)
LDL Calculated: 111 mg/dL — ABNORMAL HIGH (ref 0–99)
Triglycerides: 99 mg/dL (ref 0–149)
VLDL Cholesterol Cal: 20 mg/dL (ref 5–40)

## 2017-12-19 NOTE — Telephone Encounter (Signed)
Medication refill

## 2017-12-21 MED ORDER — PRAVASTATIN SODIUM 80 MG PO TABS: 80.0000 mg | ORAL_TABLET | Freq: Every day | ORAL | 1 refills | Status: DC

## 2017-12-30 ENCOUNTER — Encounter: Payer: Self-pay | Admitting: Family Medicine

## 2018-01-01 ENCOUNTER — Encounter: Payer: Self-pay | Admitting: Family Medicine

## 2018-01-01 NOTE — Progress Notes (Signed)
Mail lab letter  

## 2018-04-14 ENCOUNTER — Other Ambulatory Visit: Payer: Self-pay | Admitting: Family Medicine

## 2018-05-16 ENCOUNTER — Encounter: Payer: Self-pay | Admitting: Family Medicine

## 2018-05-16 ENCOUNTER — Ambulatory Visit (INDEPENDENT_AMBULATORY_CARE_PROVIDER_SITE_OTHER): Payer: Medicaid Other | Admitting: Family Medicine

## 2018-05-16 VITALS — BP 128/60 | HR 80 | Temp 98.0°F | Ht 64.0 in | Wt 196.0 lb

## 2018-05-16 DIAGNOSIS — Z09 Encounter for follow-up examination after completed treatment for conditions other than malignant neoplasm: Secondary | ICD-10-CM

## 2018-05-16 DIAGNOSIS — N390 Urinary tract infection, site not specified: Secondary | ICD-10-CM

## 2018-05-16 DIAGNOSIS — E785 Hyperlipidemia, unspecified: Secondary | ICD-10-CM

## 2018-05-16 DIAGNOSIS — I10 Essential (primary) hypertension: Secondary | ICD-10-CM | POA: Diagnosis not present

## 2018-05-16 DIAGNOSIS — R7303 Prediabetes: Secondary | ICD-10-CM | POA: Diagnosis not present

## 2018-05-16 LAB — POCT URINALYSIS DIP (MANUAL ENTRY)
Bilirubin, UA: NEGATIVE
Blood, UA: NEGATIVE
Glucose, UA: NEGATIVE mg/dL
Ketones, POC UA: NEGATIVE mg/dL
Nitrite, UA: NEGATIVE
Protein Ur, POC: NEGATIVE mg/dL
Spec Grav, UA: <=1.005 — AB (ref 1.010–1.025)
Urobilinogen, UA: 0.2 E.U./dL
pH, UA: 5.5 (ref 5.0–8.0)

## 2018-05-16 LAB — POCT GLYCOSYLATED HEMOGLOBIN (HGB A1C): Hemoglobin A1C: 5.8 % — AB (ref 4.0–5.6)

## 2018-05-16 MED ORDER — SULFAMETHOXAZOLE-TRIMETHOPRIM 800-160 MG PO TABS: 1.0000 | ORAL_TABLET | Freq: Two times a day (BID) | ORAL | 0 refills | Status: DC

## 2018-05-16 NOTE — Patient Instructions (Signed)
Sulfamethoxazole; Trimethoprim, SMX-TMP oral suspension What is this medicine? SULFAMETHOXAZOLE; TRIMETHOPRIM or SMX-TMP (suhl fuh meth OK suh zohl; trye METH oh prim) is a combination of a sulfonamide antibiotic and a second antibiotic, trimethoprim. It is used to treat or prevent certain kinds of bacterial infections.It will not work for colds, flu, or other viral infections. This medicine may be used for other purposes; ask your health care provider or pharmacist if you have questions. COMMON BRAND NAME(S): Septra, Sulfatrim, Sulfatrim Pediatric, Sultrex Pediatric What should I tell my health care provider before I take this medicine? They need to know if you have any of these conditions: -anemia -asthma -being treated with anticonvulsants -if you frequently drink alcohol containing drinks -kidney disease -liver disease -low level of folic acid or glucose-6-phosphate dehydrogenase -poor nutrition or malabsorption -porphyria -severe allergies -thyroid disorder -an unusual or allergic reaction to sulfamethoxazole, trimethoprim, sulfa drugs, other medicines, foods, dyes, or preservatives -pregnant or trying to get pregnant -breast-feeding How should I use this medicine? Take this suspension by mouth. Follow the directions on the prescription label. Shake the bottle well before taking. Use a specially marked spoon or container to measure your medicine. Ask your pharmacist if you do not have one. Household spoons are not accurate. Take your doses at regular intervals. Do not take more medicine than directed. Talk to your pediatrician regarding the use of this medicine in children. Special care may be needed. While this drug may be prescribed for children as young as 2 months of age for selected conditions, precautions do apply. Overdosage: If you think you have taken too much of this medicine contact a poison control center or emergency room at once. NOTE: This medicine is only for you. Do not  share this medicine with others. What if I miss a dose? If you miss a dose, take it as soon as you can. If it is almost time for your next dose, take only that dose. Do not take double or extra doses. What may interact with this medicine? Do not take this medicine with any of the following medications -aminobenzoate potassium -dofetilide -metronidazole This medicine may also interact with the following medications -ACE inhibitors like benazepril, enalapril, lisinopril, and ramipril -birth control pills -cyclosporine -digoxin -diuretics -indomethacin -medicines for diabetes -methenamine -methotrexate -phenytoin -potassium supplements -pyrimethamine -sulfinpyrazone -tricyclic antidepressants -warfarin This list may not describe all possible interactions. Give your health care provider a list of all the medicines, herbs, non-prescription drugs, or dietary supplements you use. Also tell them if you smoke, drink alcohol, or use illegal drugs. Some items may interact with your medicine. What should I watch for while using this medicine? Tell your doctor or health care professional if your symptoms do not improve. Drink several glasses of water a day to reduce the risk of kidney problems. Do not treat diarrhea with over the counter products. Contact your doctor if you have diarrhea that lasts more than 2 days or if it is severe and watery. This medicine can make you more sensitive to the sun. Keep out of the sun. If you cannot avoid being in the sun, wear protective clothing and use a sunscreen. Do not use sun lamps or tanning beds/booths. What side effects may I notice from receiving this medicine? Side effects that you should report to your doctor or health care professional as soon as possible: -allergic reactions like skin rash or hives, swelling of the face, lips, or tongue -breathing problems -fever or chills, sore throat -irregular heartbeat, chest pain -  joint or muscle pain -pain  or difficulty passing urine -red pinpoint spots on skin -redness, blistering, peeling or loosening of the skin, including inside the mouth -unusual bleeding or bruising -unusual weakness or tiredness -yellowing of the eyes or skin Side effects that usually do not require medical attention (report to your doctor or health care professional if they continue or are bothersome): -diarrhea -dizziness -headache -loss of appetite -nausea, vomiting -nervousness This list may not describe all possible side effects. Call your doctor for medical advice about side effects. You may report side effects to FDA at 1-800-FDA-1088. Where should I keep my medicine? Keep out of the reach of children. Store at room temperature between 15 and 25 degrees C (59 and 77 degrees F). Protect from light and moisture. Throw away any unused medicine after the expiration date. NOTE: This sheet is a summary. It may not cover all possible information. If you have questions about this medicine, talk to your doctor, pharmacist, or health care provider.  2018 Elsevier/Gold Standard (2013-05-22 14:37:40)  

## 2018-05-16 NOTE — Progress Notes (Signed)
Follow Up Appointment  Subjective:    Patient ID: Teresa Collier, female    DOB: 20-Jun-1951, 67 y.o.   MRN: [id number]   PCP: Kathe Becton, NP  No chief complaint on file.  HPI  Teresa Collier has a past medical history of Hypertension, Hyperlipidemia, Heart Murmur, and Cataract. She is her for follow up.   Current Status: Since her last office visit, she is doing well with no complaints. She denies fevers, chills, fatigue, recent infections, weight loss, and night sweats. She has not had any headaches, visual changes, dizziness, and falls. No chest pain, heart palpitations, cough and shortness of breath reported. No reports of GI problems such as nausea, vomiting, diarrhea, and constipation. She has no reports of blood in stools, dysuria and hematuria. No depression or anxiety, and denies suicidal ideations, homicidal ideations, or auditory hallucinations. She states that she has generalized joint pain today.   Past Medical History:  Diagnosis Date  . Blood transfusion without reported diagnosis    1973  . Cataract    "beginnings"  . Heart murmur   . Hyperlipidemia   . Hypertension     Family History  Problem Relation Age of Onset  . Esophageal cancer Father   . Colon cancer Neg Hx   . Rectal cancer Neg Hx   . Stomach cancer Neg Hx     Social History   Socioeconomic History  . Marital status: Married    Spouse name: Not on file  . Number of children: Not on file  . Years of education: Not on file  . Highest education level: Not on file  Occupational History  . Not on file  Social Needs  . Financial resource strain: Not on file  . Food insecurity:    Worry: Not on file    Inability: Not on file  . Transportation needs:    Medical: Not on file    Non-medical: Not on file  Tobacco Use  . Smoking status: Former Smoker    Packs/day: 2.00    Years: 20.00    Pack years: 40.00    Types: Cigarettes    Last attempt to quit: 01/27/2009    Years since quitting: 9.3  .  Smokeless tobacco: Never Used  Substance and Sexual Activity  . Alcohol use: No    Comment: stopped drinking 2010  . Drug use: No  . Sexual activity: Not on file  Lifestyle  . Physical activity:    Days per week: Not on file    Minutes per session: Not on file  . Stress: Not on file  Relationships  . Social connections:    Talks on phone: Not on file    Gets together: Not on file    Attends religious service: Not on file    Active member of club or organization: Not on file    Attends meetings of clubs or organizations: Not on file    Relationship status: Not on file  . Intimate partner violence:    Fear of current or ex partner: Not on file    Emotionally abused: Not on file    Physically abused: Not on file    Forced sexual activity: Not on file  Other Topics Concern  . Not on file  Social History Narrative  . Not on file    Past Surgical History:  Procedure Laterality Date  . APPENDECTOMY    . BUNIONECTOMY    . COLONOSCOPY    . OPEN REDUCTION INTERNAL FIXATION (ORIF) FOOT  LISFRANC FRACTURE Left 07/25/2016   Procedure: OPEN REDUCTION INTERNAL FIXATION (ORIF) FOOT LISFRANC FRACTURE;  Surgeon: Leandrew Koyanagi, MD;  Location: [facility];  Service: Orthopedics;  Laterality: Left;  . ORIF ANKLE FRACTURE Left 07/25/2016   Procedure: OPEN REDUCTION INTERNAL FIXATION (ORIF) LEFT BIMALLEOLAR  ANKLE AND LISFRANC FRACTURES;  Surgeon: Leandrew Koyanagi, MD;  Location: [facility];  Service: Orthopedics;  Laterality: Left;    Immunization History  Administered Date(s) Administered  . Influenza,inj,Quad PF,6+ Mos 08/27/2016, 12/11/2017  . Influenza-Unspecified 10/21/2013  . Pneumococcal Conjugate-13 09/24/2016  . Tdap 10/30/2007, 09/24/2016    Current Meds  Medication Sig  . hydrochlorothiazide (HYDRODIURIL) 25 MG tablet Take 1 tablet (25 mg total) by mouth daily.  Marland Kitchen NIFEdipine (PROCARDIA XL/ADALAT-CC) 60 MG 24 hr tablet TAKE 1 TABLET BY MOUTH ONCE DAILY  .  pravastatin (PRAVACHOL) 80 MG tablet Take 1 tablet (80 mg total) by mouth daily.   Current Facility-Administered Medications for the 05/16/18 encounter (Office Visit) with Azzie Glatter, FNP  Medication  . 0.9 %  sodium chloride infusion    No Known Allergies BP 128/60 (BP Location: Right Arm, Patient Position: Sitting, Cuff Size: Large)   Pulse 80   Temp 98 F (36.7 C) (Oral)   Ht 5\' 4"  (1.626 m)   Wt 196 lb (88.9 kg)   SpO2 98%   BMI 33.64 kg/m   Review of Systems  Constitutional: Negative.   HENT: Negative.   Eyes: Negative.   Respiratory: Negative.   Cardiovascular: Negative.   Gastrointestinal: Negative.   Genitourinary: Negative.   Musculoskeletal: Positive for joint pain (generalized).  Skin: Negative.   Neurological: Negative.   Endo/Heme/Allergies: Negative.   Psychiatric/Behavioral: Negative.    Objective:   Physical Exam  Constitutional: She is oriented to person, place, and time. She appears well-developed and well-nourished.  HENT:  Head: Normocephalic and atraumatic.  Right Ear: External ear normal.  Left Ear: External ear normal.  Nose: Nose normal.  Mouth/Throat: Oropharynx is clear and moist.  Eyes: Pupils are equal, round, and reactive to light. Conjunctivae and EOM are normal.  Neck: Normal range of motion. Neck supple.  Cardiovascular: Normal rate, regular rhythm, normal heart sounds and intact distal pulses.  Pulmonary/Chest: Effort normal and breath sounds normal.  Abdominal: Soft. Bowel sounds are normal.  Musculoskeletal: Normal range of motion.  Neurological: She is alert and oriented to person, place, and time.  Skin: Skin is warm and dry. Capillary refill takes less than 2 seconds.  Psychiatric: She has a normal mood and affect. Her behavior is normal. Judgment and thought content normal.  Nursing note and vitals reviewed.  Assessment & Plan:   1. Prediabetes Hgb A1c improved at 5.8, from 5.9 on 12/11/2017.  She will continue to  decrease foods/beverages high in sugars and carbs and follow Heart Healthy or DASH diet. Increase physical activity to at least 30 minutes cardio exercise daily.   - POCT glycosylated hemoglobin (Hb A1C) - POCT urinalysis dipstick  2. Urinary tract infection without hematuria, site unspecified - sulfamethoxazole-trimethoprim (BACTRIM DS,SEPTRA DS) 800-160 MG tablet; Take 1 tablet by mouth 2 (two) times daily.  Dispense: 20 tablet; Refill: 0  3. Essential hypertension Blood pressure is stable at 128/60 today. Continue HCTZ and Procardia as prescribed.   4. Hyperlipidemia, unspecified hyperlipidemia type Lipid panel stable on 12/11/2017. Continue Pravachol as prescribed.   5. Follow up She will follow up in 6 months.   Meds ordered this encounter  Medications  .  sulfamethoxazole-trimethoprim (BACTRIM DS,SEPTRA DS) 800-160 MG tablet    Sig: Take 1 tablet by mouth 2 (two) times daily.    Dispense:  20 tablet    Refill:  0   Kathe Becton,  MSN, FNP-C [facility] [address] [place], [place] [address] [phone]

## 2018-06-10 ENCOUNTER — Ambulatory Visit: Payer: Medicaid Other | Admitting: Family Medicine

## 2018-09-04 ENCOUNTER — Telehealth: Payer: Self-pay

## 2018-09-04 NOTE — Telephone Encounter (Signed)
Patient will be at appointment on 09/08/2018 at 8am.

## 2018-09-08 ENCOUNTER — Ambulatory Visit (INDEPENDENT_AMBULATORY_CARE_PROVIDER_SITE_OTHER): Payer: Medicare Other | Admitting: Family Medicine

## 2018-09-08 ENCOUNTER — Encounter: Payer: Self-pay | Admitting: Family Medicine

## 2018-09-08 VITALS — BP 124/66 | HR 78 | Temp 98.0°F | Ht 64.0 in | Wt 194.0 lb

## 2018-09-08 DIAGNOSIS — Z23 Encounter for immunization: Secondary | ICD-10-CM | POA: Diagnosis not present

## 2018-09-08 DIAGNOSIS — I10 Essential (primary) hypertension: Secondary | ICD-10-CM | POA: Diagnosis not present

## 2018-09-08 DIAGNOSIS — Z09 Encounter for follow-up examination after completed treatment for conditions other than malignant neoplasm: Secondary | ICD-10-CM

## 2018-09-08 DIAGNOSIS — R0602 Shortness of breath: Secondary | ICD-10-CM | POA: Diagnosis not present

## 2018-09-08 DIAGNOSIS — H9193 Unspecified hearing loss, bilateral: Secondary | ICD-10-CM | POA: Diagnosis not present

## 2018-09-08 DIAGNOSIS — E785 Hyperlipidemia, unspecified: Secondary | ICD-10-CM | POA: Diagnosis not present

## 2018-09-08 DIAGNOSIS — R8290 Unspecified abnormal findings in urine: Secondary | ICD-10-CM

## 2018-09-08 DIAGNOSIS — Z131 Encounter for screening for diabetes mellitus: Secondary | ICD-10-CM

## 2018-09-08 LAB — POCT URINALYSIS DIP (MANUAL ENTRY)
Bilirubin, UA: NEGATIVE
Blood, UA: NEGATIVE
Glucose, UA: NEGATIVE mg/dL
Ketones, POC UA: NEGATIVE mg/dL
Nitrite, UA: NEGATIVE
Protein Ur, POC: NEGATIVE mg/dL
Spec Grav, UA: 1.015 (ref 1.010–1.025)
Urobilinogen, UA: 1 E.U./dL
pH, UA: 7 (ref 5.0–8.0)

## 2018-09-08 MED ORDER — ALBUTEROL SULFATE HFA 108 (90 BASE) MCG/ACT IN AERS: 2.0000 | INHALATION_SPRAY | Freq: Four times a day (QID) | RESPIRATORY_TRACT | 11 refills | Status: DC | PRN

## 2018-09-08 NOTE — Progress Notes (Signed)
Follow Up  Subjective:    Patient ID: Teresa Collier, female    DOB: 1951-05-29, 67 y.o.   MRN: [id number]   Chief Complaint  Patient presents with  . Follow-up    chronic condtion/ A1C WAS 5.7    HPI  Teresa Collier is a 67 year old female with a past medical history of Hypertension, Hyperlipidemia, Heart Murmur, and Cataract. She is here today for follow up.  Current Status: Since her last office visit, she is doing well with complains of progressing hearing loss. She states that she has a history of hearing problems since she was in her 39's. Hearing loss is greater in her right ear. She reports occasional shortness of breath on exertion and at rest. She denies visual changes, chest pain, cough, shortness of breath, heart palpitations, and falls. She has occasionally headaches and dizziness with position changes. Denies severe headaches, confusion, seizures, double vision, and blurred vision, nausea and vomiting.  She denies fevers, chills, recent infections, weight loss, and night sweats. She has not had any dizziness, and falls. No chest pain, heart palpitations, cough and reported. No reports of GI problems such as nausea, vomiting, diarrhea, and constipation. She has no reports of blood in stools, dysuria and hematuria. No depression or anxiety reported.   Past Medical History:  Diagnosis Date  . Blood transfusion without reported diagnosis    1973  . Cataract    "beginnings"  . Heart murmur   . Hyperlipidemia   . Hypertension     Family History  Problem Relation Age of Onset  . Esophageal cancer Father   . Colon cancer Neg Hx   . Rectal cancer Neg Hx   . Stomach cancer Neg Hx     Social History   Socioeconomic History  . Marital status: Married    Spouse name: Not on file  . Number of children: Not on file  . Years of education: Not on file  . Highest education level: Not on file  Occupational History  . Not on file  Social Needs  . Financial resource strain:  Not on file  . Food insecurity:    Worry: Not on file    Inability: Not on file  . Transportation needs:    Medical: Not on file    Non-medical: Not on file  Tobacco Use  . Smoking status: Former Smoker    Packs/day: 2.00    Years: 20.00    Pack years: 40.00    Types: Cigarettes    Last attempt to quit: 01/27/2009    Years since quitting: 9.6  . Smokeless tobacco: Never Used  Substance and Sexual Activity  . Alcohol use: No    Comment: stopped drinking 2010  . Drug use: No  . Sexual activity: Not on file  Lifestyle  . Physical activity:    Days per week: Not on file    Minutes per session: Not on file  . Stress: Not on file  Relationships  . Social connections:    Talks on phone: Not on file    Gets together: Not on file    Attends religious service: Not on file    Active member of club or organization: Not on file    Attends meetings of clubs or organizations: Not on file    Relationship status: Not on file  . Intimate partner violence:    Fear of current or ex partner: Not on file    Emotionally abused: Not on file  Physically abused: Not on file    Forced sexual activity: Not on file  Other Topics Concern  . Not on file  Social History Narrative  . Not on file    Past Surgical History:  Procedure Laterality Date  . APPENDECTOMY    . BUNIONECTOMY    . COLONOSCOPY    . OPEN REDUCTION INTERNAL FIXATION (ORIF) FOOT LISFRANC FRACTURE Left 07/25/2016   Procedure: OPEN REDUCTION INTERNAL FIXATION (ORIF) FOOT LISFRANC FRACTURE;  Surgeon: Leandrew Koyanagi, MD;  Location: [facility];  Service: Orthopedics;  Laterality: Left;  . ORIF ANKLE FRACTURE Left 07/25/2016   Procedure: OPEN REDUCTION INTERNAL FIXATION (ORIF) LEFT BIMALLEOLAR  ANKLE AND LISFRANC FRACTURES;  Surgeon: Leandrew Koyanagi, MD;  Location: [facility];  Service: Orthopedics;  Laterality: Left;    Immunization History  Administered Date(s) Administered  . Influenza,inj,Quad PF,6+ Mos  08/27/2016, 12/11/2017, 09/08/2018  . Influenza-Unspecified 10/21/2013  . Pneumococcal Conjugate-13 09/24/2016  . Tdap 10/30/2007, 09/24/2016    Current Meds  Medication Sig  . hydrochlorothiazide (HYDRODIURIL) 25 MG tablet Take 1 tablet (25 mg total) by mouth daily.  Marland Kitchen NIFEdipine (PROCARDIA XL/ADALAT-CC) 60 MG 24 hr tablet TAKE 1 TABLET BY MOUTH ONCE DAILY  . pravastatin (PRAVACHOL) 80 MG tablet Take 1 tablet (80 mg total) by mouth daily.   Current Facility-Administered Medications for the 09/08/18 encounter (Office Visit) with Azzie Glatter, FNP  Medication  . 0.9 %  sodium chloride infusion    No Known Allergies  BP 124/66 (BP Location: Left Arm, Patient Position: Sitting, Cuff Size: Large)   Pulse 78   Temp 98 F (36.7 C) (Oral)   Ht 5\' 4"  (1.626 m)   Wt 194 lb (88 kg)   SpO2 96%   BMI 33.30 kg/m    Review of Systems  Constitutional: Negative.   HENT: Negative.   Respiratory: Positive for shortness of breath (Occasional).   Cardiovascular: Negative.   Gastrointestinal: Positive for abdominal pain (Occasional ).  Genitourinary: Negative.   Musculoskeletal: Positive for arthralgias (Generalized. ).  Skin: Negative.   Allergic/Immunologic: Negative.   Neurological: Negative.   Hematological: Negative.   Psychiatric/Behavioral: Negative.     Objective:   Physical Exam  Constitutional: She is oriented to person, place, and time. She appears well-developed and well-nourished.  HENT:  Head: Normocephalic and atraumatic.  Eyes: Pupils are equal, round, and reactive to light. Conjunctivae and EOM are normal.  Neck: Normal range of motion. Neck supple.  Cardiovascular: Normal rate, regular rhythm, normal heart sounds and intact distal pulses.  Pulmonary/Chest: Effort normal and breath sounds normal.  Abdominal: Soft. Bowel sounds are normal.  Musculoskeletal: Normal range of motion.  Neurological: She is alert and oriented to person, place, and time.  Skin: Skin  is warm and dry.  Psychiatric: She has a normal mood and affect. Her behavior is normal. Judgment and thought content normal.  Nursing note and vitals reviewed.  Assessment & Plan:   1. Bilateral hearing loss, unspecified hearing loss type - Ambulatory referral to ENT  2. Shortness of breath - albuterol (PROVENTIL HFA;VENTOLIN HFA) 108 (90 Base) MCG/ACT inhaler; Inhale 2 puffs into the lungs every 6 (six) hours as needed for wheezing or shortness of breath.  Dispense: 1 Inhaler; Refill: 11  3. Essential hypertension Antihypertensive medications are effective.  Today. Blood pressure is at  124/66 today. Continue HCTZ and Nifedipine as prescribed. She will continue to decrease high sodium intake, excessive alcohol intake, increase potassium intake, smoking  cessation, and increase physical activity of at least 30 minutes of cardio activity daily. She will continue to follow Heart Healthy or DASH diet.  4. Hyperlipidemia, unspecified hyperlipidemia type Continue Pravachol as prescribed.   5. Screening for diabetes mellitus Last Hgb A1c at 5.8 on 05/16/2018.  She will continue to decrease foods/beverages high in sugars and carbs and follow Heart Healthy or DASH diet. Increase physical activity to at least 30 minutes cardio exercise daily.  6. Need for immunization against influenza - Flu Vaccine QUAD 36+ mos IM  7. Follow up She will follow up in 6 months.  - POCT urinalysis dipstick  Meds ordered this encounter  Medications  . albuterol (PROVENTIL HFA;VENTOLIN HFA) 108 (90 Base) MCG/ACT inhaler    Sig: Inhale 2 puffs into the lungs every 6 (six) hours as needed for wheezing or shortness of breath.    Dispense:  1 Inhaler    Refill:  [place],  MSN, [facility] [place] [address] [place], [place] [address] [phone]

## 2018-09-08 NOTE — Patient Instructions (Signed)
Heart-Healthy Eating Plan Heart-healthy meal planning includes:  Limiting unhealthy fats.  Increasing healthy fats.  Making other small dietary changes.  You may need to talk with your doctor or a diet specialist (dietitian) to create an eating plan that is right for you. What types of fat should I choose?  Choose healthy fats. These include olive oil and canola oil, flaxseeds, walnuts, almonds, and seeds.  Eat more omega-3 fats. These include salmon, mackerel, sardines, tuna, flaxseed oil, and ground flaxseeds. Try to eat fish at least twice each week.  Limit saturated fats. ? Saturated fats are often found in animal products, such as meats, butter, and cream. ? Plant sources of saturated fats include palm oil, palm kernel oil, and coconut oil.  Avoid foods with partially hydrogenated oils in them. These include stick margarine, some tub margarines, cookies, crackers, and other baked goods. These contain trans fats. What general guidelines do I need to follow?  Check food labels carefully. Identify foods with trans fats or high amounts of saturated fat.  Fill one half of your plate with vegetables and green salads. Eat 4-5 servings of vegetables per day. A serving of vegetables is: ? 1 cup of raw leafy vegetables. ?  cup of raw or cooked cut-up vegetables. ?  cup of vegetable juice.  Fill one fourth of your plate with whole grains. Look for the word "whole" as the first word in the ingredient list.  Fill one fourth of your plate with lean protein foods.  Eat 4-5 servings of fruit per day. A serving of fruit is: ? One medium whole fruit. ?  cup of dried fruit. ?  cup of fresh, frozen, or canned fruit. ?  cup of 100% fruit juice.  Eat more foods that contain soluble fiber. These include apples, broccoli, carrots, beans, peas, and barley. Try to get 20-30 g of fiber per day.  Eat more home-cooked food. Eat less restaurant, buffet, and fast food.  Limit or avoid  alcohol.  Limit foods high in starch and sugar.  Avoid fried foods.  Avoid frying your food. Try baking, boiling, grilling, or broiling it instead. You can also reduce fat by: ? Removing the skin from poultry. ? Removing all visible fats from meats. ? Skimming the fat off of stews, soups, and gravies before serving them. ? Steaming vegetables in water or broth.  Lose weight if you are overweight.  Eat 4-5 servings of nuts, legumes, and seeds per week: ? One serving of dried beans or legumes equals  cup after being cooked. ? One serving of nuts equals 1 ounces. ? One serving of seeds equals  ounce or one tablespoon.  You may need to keep track of how much salt or sodium you eat. This is especially true if you have high blood pressure. Talk with your doctor or dietitian to get more information. What foods can I eat? Grains Breads, including French, white, pita, wheat, raisin, rye, oatmeal, and Italian. Tortillas that are neither fried nor made with lard or trans fat. Low-fat rolls, including hotdog and hamburger buns and English muffins. Biscuits. Muffins. Waffles. Pancakes. Light popcorn. Whole-grain cereals. Flatbread. Melba toast. Pretzels. Breadsticks. Rusks. Low-fat snacks. Low-fat crackers, including oyster, saltine, matzo, graham, animal, and rye. Rice and pasta, including brown rice and pastas that are made with whole wheat. Vegetables All vegetables. Fruits All fruits, but limit coconut. Meats and Other Protein Sources Lean, well-trimmed beef, veal, pork, and lamb. Chicken and turkey without skin. All fish and shellfish.   Wild duck, rabbit, pheasant, and venison. Egg whites or low-cholesterol egg substitutes. Dried beans, peas, lentils, and tofu. Seeds and most nuts. Dairy Low-fat or nonfat cheeses, including ricotta, string, and mozzarella. Skim or 1% milk that is liquid, powdered, or evaporated. Buttermilk that is made with low-fat milk. Nonfat or low-fat  yogurt. Beverages Mineral water. Diet carbonated beverages. Sweets and Desserts Sherbets and fruit ices. Honey, jam, marmalade, jelly, and syrups. Meringues and gelatins. Pure sugar candy, such as hard candy, jelly beans, gumdrops, mints, marshmallows, and small amounts of dark chocolate. Angel food cake. Eat all sweets and desserts in moderation. Fats and Oils Nonhydrogenated (trans-free) margarines. Vegetable oils, including soybean, sesame, sunflower, olive, peanut, safflower, corn, canola, and cottonseed. Salad dressings or mayonnaise made with a vegetable oil. Limit added fats and oils that you use for cooking, baking, salads, and as spreads. Other Cocoa powder. Coffee and tea. All seasonings and condiments. The items listed above may not be a complete list of recommended foods or beverages. Contact your dietitian for more options. What foods are not recommended? Grains Breads that are made with saturated or trans fats, oils, or whole milk. Croissants. Butter rolls. Cheese breads. Sweet rolls. Donuts. Buttered popcorn. Chow mein noodles. High-fat crackers, such as cheese or butter crackers. Meats and Other Protein Sources Fatty meats, such as hotdogs, short ribs, sausage, spareribs, bacon, rib eye roast or steak, and mutton. High-fat deli meats, such as salami and bologna. Caviar. Domestic duck and goose. Organ meats, such as kidney, liver, sweetbreads, and heart. Dairy Cream, sour cream, cream cheese, and creamed cottage cheese. Whole-milk cheeses, including blue (bleu), Monterey Jack, Brie, [place], American, Havarti, Swiss, cheddar, Camembert, and [place]. Whole or 2% milk that is liquid, evaporated, or condensed. Whole buttermilk. Cream sauce or high-fat cheese sauce. Yogurt that is made from whole milk. Beverages Regular sodas and juice drinks with added sugar. Sweets and Desserts Frosting. Pudding. Cookies. Cakes other than angel food cake. Candy that has milk chocolate or white  chocolate, hydrogenated fat, butter, coconut, or unknown ingredients. Buttered syrups. Full-fat ice cream or ice cream drinks. Fats and Oils Gravy that has suet, meat fat, or shortening. Cocoa butter, hydrogenated oils, palm oil, coconut oil, palm kernel oil. These can often be found in baked products, candy, fried foods, nondairy creamers, and whipped toppings. Solid fats and shortenings, including bacon fat, salt pork, lard, and butter. Nondairy cream substitutes, such as coffee creamers and sour cream substitutes. Salad dressings that are made of unknown oils, cheese, or sour cream. The items listed above may not be a complete list of foods and beverages to avoid. Contact your dietitian for more information. This information is not intended to replace advice given to you by your health care provider. Make sure you discuss any questions you have with your health care provider. Document Released: 04/15/2012 Document Revised: 03/22/2016 Document Reviewed: 04/08/2014 Elsevier Interactive Patient Education  2018 [employer] Inc[employer] DASH Eating Plan DASH stands for "Dietary Approaches to Stop Hypertension." The DASH eating plan is a healthy eating plan that has been shown to reduce high blood pressure (hypertension). It may also reduce your risk for type 2 diabetes, heart disease, and stroke. The DASH eating plan may also help with weight loss. What are tips for following this plan? General guidelines  Avoid eating more than 2,300 mg (milligrams) of salt (sodium) a day. If you have hypertension, you may need to reduce your sodium intake to 1,500 mg a day.  Limit alcohol intake to no more   than 1 drink a day for nonpregnant women and 2 drinks a day for men. One drink equals 12 oz of beer, 5 oz of wine, or 1 oz of hard liquor.  Work with your health care provider to maintain a healthy body weight or to lose weight. Ask what an ideal weight is for you.  Get at least 30 minutes of exercise that causes your  heart to beat faster (aerobic exercise) most days of the week. Activities may include walking, swimming, or biking.  Work with your health care provider or diet and nutrition specialist (dietitian) to adjust your eating plan to your individual calorie needs. Reading food labels  Check food labels for the amount of sodium per serving. Choose foods with less than 5 percent of the Daily Value of sodium. Generally, foods with less than 300 mg of sodium per serving fit into this eating plan.  To find whole grains, look for the word "whole" as the first word in the ingredient list. Shopping  Buy products labeled as "low-sodium" or "no salt added."  Buy fresh foods. Avoid canned foods and premade or frozen meals. Cooking  Avoid adding salt when cooking. Use salt-free seasonings or herbs instead of table salt or sea salt. Check with your health care provider or pharmacist before using salt substitutes.  Do not fry foods. Cook foods using healthy methods such as baking, boiling, grilling, and broiling instead.  Cook with heart-healthy oils, such as olive, canola, soybean, or sunflower oil. Meal planning   Eat a balanced diet that includes: ? 5 or more servings of fruits and vegetables each day. At each meal, try to fill half of your plate with fruits and vegetables. ? Up to 6-8 servings of whole grains each day. ? Less than 6 oz of lean meat, poultry, or fish each day. A 3-oz serving of meat is about the same size as a deck of cards. One egg equals 1 oz. ? 2 servings of low-fat dairy each day. ? A serving of nuts, seeds, or beans 5 times each week. ? Heart-healthy fats. Healthy fats called Omega-3 fatty acids are found in foods such as flaxseeds and coldwater fish, like sardines, salmon, and mackerel.  Limit how much you eat of the following: ? Canned or prepackaged foods. ? Food that is high in trans fat, such as fried foods. ? Food that is high in saturated fat, such as fatty  meat. ? Sweets, desserts, sugary drinks, and other foods with added sugar. ? Full-fat dairy products.  Do not salt foods before eating.  Try to eat at least 2 vegetarian meals each week.  Eat more home-cooked food and less restaurant, buffet, and fast food.  When eating at a restaurant, ask that your food be prepared with less salt or no salt, if possible. What foods are recommended? The items listed may not be a complete list. Talk with your dietitian about what dietary choices are best for you. Grains Whole-grain or whole-wheat bread. Whole-grain or whole-wheat pasta. Brown rice. Oatmeal. Quinoa. Bulgur. Whole-grain and low-sodium cereals. Pita bread. Low-fat, low-sodium crackers. Whole-wheat flour tortillas. Vegetables Fresh or frozen vegetables (raw, steamed, roasted, or grilled). Low-sodium or reduced-sodium tomato and vegetable juice. Low-sodium or reduced-sodium tomato sauce and tomato paste. Low-sodium or reduced-sodium canned vegetables. Fruits All fresh, dried, or frozen fruit. Canned fruit in natural juice (without added sugar). Meat and other protein foods Skinless chicken or turkey. Ground chicken or turkey. Pork with fat trimmed off. Fish and seafood. Egg   whites. Dried beans, peas, or lentils. Unsalted nuts, nut butters, and seeds. Unsalted canned beans. Lean cuts of beef with fat trimmed off. Low-sodium, lean deli meat. Dairy Low-fat (1%) or fat-free (skim) milk. Fat-free, low-fat, or reduced-fat cheeses. Nonfat, low-sodium ricotta or cottage cheese. Low-fat or nonfat yogurt. Low-fat, low-sodium cheese. Fats and oils Soft margarine without trans fats. Vegetable oil. Low-fat, reduced-fat, or light mayonnaise and salad dressings (reduced-sodium). Canola, safflower, olive, soybean, and sunflower oils. Avocado. Seasoning and other foods Herbs. Spices. Seasoning mixes without salt. Unsalted popcorn and pretzels. Fat-free sweets. What foods are not recommended? The items listed  may not be a complete list. Talk with your dietitian about what dietary choices are best for you. Grains Baked goods made with fat, such as croissants, muffins, or some breads. Dry pasta or rice meal packs. Vegetables Creamed or fried vegetables. Vegetables in a cheese sauce. Regular canned vegetables (not low-sodium or reduced-sodium). Regular canned tomato sauce and paste (not low-sodium or reduced-sodium). Regular tomato and vegetable juice (not low-sodium or reduced-sodium). Pickles. Olives. Fruits Canned fruit in a light or heavy syrup. Fried fruit. Fruit in cream or butter sauce. Meat and other protein foods Fatty cuts of meat. Ribs. Fried meat. Bacon. Sausage. Bologna and other processed lunch meats. Salami. Fatback. Hotdogs. Bratwurst. Salted nuts and seeds. Canned beans with added salt. Canned or smoked fish. Whole eggs or egg yolks. Chicken or turkey with skin. Dairy Whole or 2% milk, cream, and half-and-half. Whole or full-fat cream cheese. Whole-fat or sweetened yogurt. Full-fat cheese. Nondairy creamers. Whipped toppings. Processed cheese and cheese spreads. Fats and oils Butter. Stick margarine. Lard. Shortening. Ghee. Bacon fat. Tropical oils, such as coconut, palm kernel, or palm oil. Seasoning and other foods Salted popcorn and pretzels. Onion salt, garlic salt, seasoned salt, table salt, and sea salt. Worcestershire sauce. Tartar sauce. Barbecue sauce. Teriyaki sauce. Soy sauce, including reduced-sodium. Steak sauce. Canned and packaged gravies. Fish sauce. Oyster sauce. Cocktail sauce. Horseradish that you find on the shelf. Ketchup. Mustard. Meat flavorings and tenderizers. Bouillon cubes. Hot sauce and Tabasco sauce. Premade or packaged marinades. Premade or packaged taco seasonings. Relishes. Regular salad dressings. Where to find more information:  National Heart, Lung, and Blood Institute: www.nhlbi.nih.gov  American Heart Association: www.heart.org Summary  The DASH  eating plan is a healthy eating plan that has been shown to reduce high blood pressure (hypertension). It may also reduce your risk for type 2 diabetes, heart disease, and stroke.  With the DASH eating plan, you should limit salt (sodium) intake to 2,300 mg a day. If you have hypertension, you may need to reduce your sodium intake to 1,500 mg a day.  When on the DASH eating plan, aim to eat more fresh fruits and vegetables, whole grains, lean proteins, low-fat dairy, and heart-healthy fats.  Work with your health care provider or diet and nutrition specialist (dietitian) to adjust your eating plan to your individual calorie needs. This information is not intended to replace advice given to you by your health care provider. Make sure you discuss any questions you have with your health care provider. Document Released: 10/04/2011 Document Revised: 10/08/2016 Document Reviewed: 10/08/2016 Elsevier Interactive Patient Education  2018 Elsevier Inc.  

## 2018-09-10 LAB — URINE CULTURE

## 2018-09-17 ENCOUNTER — Other Ambulatory Visit: Payer: Self-pay | Admitting: Otolaryngology

## 2018-09-17 DIAGNOSIS — J320 Chronic maxillary sinusitis: Secondary | ICD-10-CM | POA: Diagnosis not present

## 2018-09-17 DIAGNOSIS — H903 Sensorineural hearing loss, bilateral: Secondary | ICD-10-CM

## 2018-09-17 DIAGNOSIS — J370 Chronic laryngitis: Secondary | ICD-10-CM | POA: Diagnosis not present

## 2018-09-17 DIAGNOSIS — J410 Simple chronic bronchitis: Secondary | ICD-10-CM | POA: Diagnosis not present

## 2018-09-17 DIAGNOSIS — J322 Chronic ethmoidal sinusitis: Secondary | ICD-10-CM | POA: Diagnosis not present

## 2018-09-17 DIAGNOSIS — R05 Cough: Secondary | ICD-10-CM | POA: Diagnosis not present

## 2018-09-17 DIAGNOSIS — H9311 Tinnitus, right ear: Secondary | ICD-10-CM | POA: Diagnosis not present

## 2018-10-02 ENCOUNTER — Ambulatory Visit
Admission: RE | Admit: 2018-10-02 | Discharge: 2018-10-02 | Disposition: A | Discharge status: Home / Self Care | Payer: Medicare Other | Source: Ambulatory Visit | Attending: Otolaryngology | Admitting: Otolaryngology

## 2018-10-02 DIAGNOSIS — H903 Sensorineural hearing loss, bilateral: Secondary | ICD-10-CM

## 2018-10-07 DIAGNOSIS — H903 Sensorineural hearing loss, bilateral: Secondary | ICD-10-CM | POA: Diagnosis not present

## 2018-10-07 DIAGNOSIS — H9311 Tinnitus, right ear: Secondary | ICD-10-CM | POA: Diagnosis not present

## 2018-10-08 ENCOUNTER — Other Ambulatory Visit: Payer: Self-pay | Admitting: Otolaryngology

## 2018-10-08 DIAGNOSIS — H8113 Benign paroxysmal vertigo, bilateral: Secondary | ICD-10-CM

## 2018-10-10 ENCOUNTER — Other Ambulatory Visit: Payer: Self-pay | Admitting: Otolaryngology

## 2018-10-10 DIAGNOSIS — H8113 Benign paroxysmal vertigo, bilateral: Secondary | ICD-10-CM

## 2018-10-10 DIAGNOSIS — H9311 Tinnitus, right ear: Secondary | ICD-10-CM

## 2018-10-10 DIAGNOSIS — H903 Sensorineural hearing loss, bilateral: Secondary | ICD-10-CM

## 2018-10-15 ENCOUNTER — Ambulatory Visit
Admission: RE | Admit: 2018-10-15 | Discharge: 2018-10-15 | Disposition: A | Discharge status: Home / Self Care | Payer: Medicare Other | Source: Ambulatory Visit | Attending: Otolaryngology | Admitting: Otolaryngology

## 2018-10-15 DIAGNOSIS — H8113 Benign paroxysmal vertigo, bilateral: Secondary | ICD-10-CM

## 2018-10-15 DIAGNOSIS — H903 Sensorineural hearing loss, bilateral: Secondary | ICD-10-CM | POA: Diagnosis not present

## 2018-10-15 DIAGNOSIS — H9311 Tinnitus, right ear: Secondary | ICD-10-CM | POA: Diagnosis not present

## 2018-10-15 MED ORDER — IOPAMIDOL (ISOVUE-300) INJECTION 61%
75.0000 mL | Freq: Once | INTRAVENOUS | Status: AC | PRN
  Administered 2018-10-15: 75 mL via INTRAVENOUS

## 2018-10-17 DIAGNOSIS — H9312 Tinnitus, left ear: Secondary | ICD-10-CM | POA: Diagnosis not present

## 2018-10-17 DIAGNOSIS — H903 Sensorineural hearing loss, bilateral: Secondary | ICD-10-CM | POA: Diagnosis not present

## 2018-10-23 ENCOUNTER — Other Ambulatory Visit: Payer: Self-pay

## 2018-10-23 MED ORDER — HYDROCHLOROTHIAZIDE 25 MG PO TABS: 25.0000 mg | ORAL_TABLET | Freq: Every day | ORAL | 1 refills | Status: DC

## 2018-10-23 MED ORDER — NIFEDIPINE ER OSMOTIC RELEASE 60 MG PO TB24: 60.0000 mg | ORAL_TABLET | Freq: Every day | ORAL | 1 refills | Status: DC

## 2018-10-23 NOTE — Telephone Encounter (Signed)
Medication sent to pharmacy  

## 2018-11-02 ENCOUNTER — Other Ambulatory Visit: Payer: Self-pay | Admitting: Family Medicine

## 2018-11-02 DIAGNOSIS — I10 Essential (primary) hypertension: Secondary | ICD-10-CM

## 2018-11-02 MED ORDER — AMLODIPINE BESYLATE 10 MG PO TABS: 10.0000 mg | ORAL_TABLET | Freq: Every day | ORAL | 1 refills | Status: DC

## 2018-11-03 ENCOUNTER — Telehealth: Payer: Self-pay

## 2018-11-03 ENCOUNTER — Other Ambulatory Visit: Payer: Self-pay | Admitting: Family Medicine

## 2018-11-03 DIAGNOSIS — I10 Essential (primary) hypertension: Secondary | ICD-10-CM

## 2018-11-03 MED ORDER — NIFEDIPINE ER OSMOTIC RELEASE 60 MG PO TB24: 60.0000 mg | ORAL_TABLET | Freq: Every day | ORAL | 1 refills | Status: DC

## 2018-11-03 NOTE — Telephone Encounter (Signed)
Patient states that her new insurance does cover Nifedipine and she wants to stay on that one.

## 2018-11-03 NOTE — Telephone Encounter (Signed)
Spoke to patient concerning insurance coverage for Nifedipine. She explains that her insurance previously did not cover this medication for the month of December 2019, but has picked up coverage beginning this year, so she would like to stay on medication.

## 2018-11-03 NOTE — Telephone Encounter (Signed)
-----   Message from Azzie Glatter, [place] sent at 11/02/2018  7:43 PM EST ----- Regarding: "New Rx" Insurance no longer covers Nifedipine. New Rx for Norvasc sent to pharmacy today. Please inform patient. Patient to schedule follow up appointment in 1 month to assess effectiveness of new medication. (Please transfer to [facility] to schedule today).  Thank you.

## 2018-11-17 ENCOUNTER — Ambulatory Visit: Payer: Medicaid Other | Admitting: Family Medicine

## 2018-12-13 ENCOUNTER — Encounter (HOSPITAL_COMMUNITY): Payer: Self-pay | Admitting: Emergency Medicine

## 2018-12-13 ENCOUNTER — Ambulatory Visit (HOSPITAL_COMMUNITY)
Admission: EM | Admit: 2018-12-13 | Discharge: 2018-12-13 | Disposition: A | Discharge status: Home / Self Care | Payer: Medicare HMO | Attending: Family Medicine | Admitting: Family Medicine

## 2018-12-13 DIAGNOSIS — J069 Acute upper respiratory infection, unspecified: Secondary | ICD-10-CM | POA: Diagnosis not present

## 2018-12-13 MED ORDER — BENZONATATE 100 MG PO CAPS: ORAL_CAPSULE | ORAL | 0 refills | Status: DC

## 2018-12-13 MED ORDER — LORATADINE-PSEUDOEPHEDRINE ER 5-120 MG PO TB12: 1.0000 | ORAL_TABLET | Freq: Two times a day (BID) | ORAL | 0 refills | Status: DC

## 2018-12-13 NOTE — ED Triage Notes (Signed)
Pt here for URI sx and sinus congestion

## 2018-12-15 DIAGNOSIS — H5200 Hypermetropia, unspecified eye: Secondary | ICD-10-CM | POA: Diagnosis not present

## 2018-12-17 NOTE — ED Provider Notes (Signed)
[facility] [place]   [id number] 12/13/18 Arrival Time: 6387  ASSESSMENT & PLAN:  1. Viral upper respiratory tract infection    Meds ordered this encounter  Medications  . loratadine-pseudoephedrine (CLARITIN-D 12-HOUR) 5-120 MG tablet    Sig: Take 1 tablet by mouth 2 (two) times daily.    Dispense:  14 tablet    Refill:  0  . benzonatate (TESSALON) 100 MG capsule    Sig: Take 1 capsule by mouth every 8 (eight) hours for cough.    Dispense:  21 capsule    Refill:  0   Discussed typical duration of symptoms. OTC symptom care as needed. Ensure adequate fluid intake and rest. May f/u with PCP or here as needed.  Reviewed expectations re: course of current medical issues. Questions answered. Outlined signs and symptoms indicating need for more acute intervention. Patient verbalized understanding. After Visit Summary given.   SUBJECTIVE: History from: patient.  Teresa Collier is a 68 y.o. female who presents with complaint of nasal congestion, post-nasal drainage, and a persistent dry cough; without sore throat. Onset abrupt, approx 3 days; without fatigue and without body aches. SOB: none. Wheezing: none. Fever: no. Overall normal PO intake without n/v. Known sick contacts: no. No specific or significant aggravating or alleviating factors reported. OTC treatment: none.  Social History   Tobacco Use  Smoking Status Former Smoker  . Packs/day: 2.00  . Years: 20.00  . Pack years: 40.00  . Types: Cigarettes  . Last attempt to quit: 01/27/2009  . Years since quitting: 9.8  Smokeless Tobacco Never Used   ROS: As per HPI.  OBJECTIVE:  Vitals:   12/13/18 1617  BP: (!) 142/71  Pulse: 100  Resp: 18  Temp: 99.8 F (37.7 C)  TempSrc: Temporal  SpO2: 95%     General appearance: alert; appears fatigued HEENT: moderate nasal congestion; clear runny nose; throat irritation secondary to post-nasal drainage Neck: supple without LAD CV: RRR Lungs: unlabored  respirations, symmetrical air entry without wheezing; cough: moderate Abd: soft Ext: no LE edema Skin: warm and dry Psychological: alert and cooperative; normal mood and affect   No Known Allergies  Past Medical History:  Diagnosis Date  . Blood transfusion without reported diagnosis    1973  . Cataract    "beginnings"  . Heart murmur   . Hyperlipidemia   . Hypertension    Family History  Problem Relation Age of Onset  . Esophageal cancer Father   . Colon cancer Neg Hx   . Rectal cancer Neg Hx   . Stomach cancer Neg Hx    Social History   Socioeconomic History  . Marital status: Married    Spouse name: Not on file  . Number of children: Not on file  . Years of education: Not on file  . Highest education level: Not on file  Occupational History  . Not on file  Social Needs  . Financial resource strain: Not on file  . Food insecurity:    Worry: Not on file    Inability: Not on file  . Transportation needs:    Medical: Not on file    Non-medical: Not on file  Tobacco Use  . Smoking status: Former Smoker    Packs/day: 2.00    Years: 20.00    Pack years: 40.00    Types: Cigarettes    Last attempt to quit: 01/27/2009    Years since quitting: 9.8  . Smokeless tobacco: Never Used  Substance and Sexual Activity  .  Alcohol use: No    Comment: stopped drinking 2010  . Drug use: No  . Sexual activity: Not on file  Lifestyle  . Physical activity:    Days per week: Not on file    Minutes per session: Not on file  . Stress: Not on file  Relationships  . Social connections:    Talks on phone: Not on file    Gets together: Not on file    Attends religious service: Not on file    Active member of club or organization: Not on file    Attends meetings of clubs or organizations: Not on file    Relationship status: Not on file  . Intimate partner violence:    Fear of current or ex partner: Not on file    Emotionally abused: Not on file    Physically abused: Not on  file    Forced sexual activity: Not on file  Other Topics Concern  . Not on file  Social History Narrative  . Not on file           Vanessa Kick, MD 12/17/18 1008

## 2019-01-09 ENCOUNTER — Emergency Department (HOSPITAL_COMMUNITY): Payer: Medicare HMO

## 2019-01-09 ENCOUNTER — Encounter (HOSPITAL_COMMUNITY): Payer: Self-pay | Admitting: Emergency Medicine

## 2019-01-09 ENCOUNTER — Encounter: Payer: Self-pay | Admitting: Family Medicine

## 2019-01-09 ENCOUNTER — Other Ambulatory Visit: Payer: Self-pay

## 2019-01-09 ENCOUNTER — Emergency Department (HOSPITAL_COMMUNITY)
Admission: EM | Admit: 2019-01-09 | Discharge: 2019-01-10 | Disposition: A | Discharge status: Home / Self Care | Payer: Medicare HMO | Attending: Emergency Medicine | Admitting: Emergency Medicine

## 2019-01-09 DIAGNOSIS — Z5321 Procedure and treatment not carried out due to patient leaving prior to being seen by health care provider: Secondary | ICD-10-CM | POA: Diagnosis not present

## 2019-01-09 DIAGNOSIS — M25571 Pain in right ankle and joints of right foot: Secondary | ICD-10-CM | POA: Insufficient documentation

## 2019-01-09 NOTE — ED Triage Notes (Signed)
Patient here from home with complaints of right ankle pain. Denies trauma.

## 2019-01-10 NOTE — ED Notes (Signed)
Discharging chart because patient is not here and chart has been open for over 18 hours.

## 2019-01-12 ENCOUNTER — Encounter: Payer: Self-pay | Admitting: Family Medicine

## 2019-01-13 ENCOUNTER — Other Ambulatory Visit: Payer: Self-pay | Admitting: Family Medicine

## 2019-01-13 ENCOUNTER — Telehealth: Payer: Self-pay

## 2019-02-24 NOTE — Telephone Encounter (Signed)
Message sent to provider 

## 2019-03-09 ENCOUNTER — Ambulatory Visit (INDEPENDENT_AMBULATORY_CARE_PROVIDER_SITE_OTHER): Payer: Medicare HMO | Admitting: Family Medicine

## 2019-03-09 ENCOUNTER — Other Ambulatory Visit: Payer: Self-pay

## 2019-03-09 ENCOUNTER — Encounter: Payer: Self-pay | Admitting: Family Medicine

## 2019-03-09 VITALS — BP 126/78 | HR 84 | Temp 97.9°F | Ht 64.0 in | Wt 192.0 lb

## 2019-03-09 DIAGNOSIS — R0982 Postnasal drip: Secondary | ICD-10-CM

## 2019-03-09 DIAGNOSIS — Z1239 Encounter for other screening for malignant neoplasm of breast: Secondary | ICD-10-CM

## 2019-03-09 DIAGNOSIS — J302 Other seasonal allergic rhinitis: Secondary | ICD-10-CM | POA: Diagnosis not present

## 2019-03-09 DIAGNOSIS — H9193 Unspecified hearing loss, bilateral: Secondary | ICD-10-CM | POA: Diagnosis not present

## 2019-03-09 DIAGNOSIS — I10 Essential (primary) hypertension: Secondary | ICD-10-CM

## 2019-03-09 DIAGNOSIS — Z131 Encounter for screening for diabetes mellitus: Secondary | ICD-10-CM

## 2019-03-09 DIAGNOSIS — E559 Vitamin D deficiency, unspecified: Secondary | ICD-10-CM

## 2019-03-09 DIAGNOSIS — E538 Deficiency of other specified B group vitamins: Secondary | ICD-10-CM

## 2019-03-09 DIAGNOSIS — Z09 Encounter for follow-up examination after completed treatment for conditions other than malignant neoplasm: Secondary | ICD-10-CM

## 2019-03-09 DIAGNOSIS — H269 Unspecified cataract: Secondary | ICD-10-CM | POA: Insufficient documentation

## 2019-03-09 LAB — POCT GLYCOSYLATED HEMOGLOBIN (HGB A1C): Hemoglobin A1C: 6 % — AB (ref 4.0–5.6)

## 2019-03-09 NOTE — Progress Notes (Signed)
Patient Teresa Collier and Teresa Collier   Established Patient Office Visit  Subjective:  Patient ID: Teresa Collier, female    DOB: 11-08-1950  Age: 68 y.o. MRN: [id number]  CC:  Chief Complaint  Patient presents with  . Follow-up    chronic conditiion     HPI Teresa Collier  Is a 68 year old female who  presents for follow up.   Past Medical History:  Diagnosis Date  . Blood transfusion without reported diagnosis    1973  . Cataract    "beginnings"  . Hearing loss in right ear   . Heart murmur   . Hyperlipidemia   . Hypertension   . Seasonal allergies    Current Status: Since her last office visit, she is doing well, but has c/o seasonal allergies today. She states that she takes antihistamines occasionally. She denies visual changes, chest pain, cough, shortness of breath, heart palpitations, and falls. She has occasional headaches and dizziness with position changes. Denies severe headaches, confusion, seizures, double vision, and blurred vision, nausea and vomiting. Her hearing loss in her right ear is stable. She continues to use hearing aide, and follows up as needed. Her vision is stable today, as she was recently diagnosed with Cataracts. She continues to follow up with Optometry, with her last appointment 01/2019. She is currently awaiting on new glasses. Her last Colonoscopy was 12/17/2016, which revealed several polyps. She will follow up for repeat screening 11/2021. She states that she no longer has occasional shortness of breath since neighbors who were mechanics, who had toxic fumes coming from wrecked cars, have moved out of neighborhood.    She denies fevers, chills, fatigue, recent infections, weight loss, and night sweats. No reports of GI problems such as diarrhea, and constipation. She has no reports of blood in stools, dysuria and hematuria. No depression or anxiety reported. She denies pain today.   Past Surgical History:  Procedure  Laterality Date  . APPENDECTOMY    . BUNIONECTOMY    . COLONOSCOPY    . OPEN REDUCTION INTERNAL FIXATION (ORIF) FOOT LISFRANC FRACTURE Left 07/25/2016   Procedure: OPEN REDUCTION INTERNAL FIXATION (ORIF) FOOT LISFRANC FRACTURE;  Surgeon: Leandrew Koyanagi, MD;  Location: [facility];  Service: Orthopedics;  Laterality: Left;  . ORIF ANKLE FRACTURE Left 07/25/2016   Procedure: OPEN REDUCTION INTERNAL FIXATION (ORIF) LEFT BIMALLEOLAR  ANKLE AND LISFRANC FRACTURES;  Surgeon: Leandrew Koyanagi, MD;  Location: [facility];  Service: Orthopedics;  Laterality: Left;    Family History  Problem Relation Age of Onset  . Esophageal cancer Father   . Colon cancer Neg Hx   . Rectal cancer Neg Hx   . Stomach cancer Neg Hx     Social History   Socioeconomic History  . Marital status: Married    Spouse name: Not on file  . Number of children: Not on file  . Years of education: Not on file  . Highest education level: Not on file  Occupational History  . Not on file  Social Needs  . Financial resource strain: Not on file  . Food insecurity:    Worry: Not on file    Inability: Not on file  . Transportation needs:    Medical: Not on file    Non-medical: Not on file  Tobacco Use  . Smoking status: Former Smoker    Packs/day: 2.00    Years: 20.00    Pack years: 40.00  Types: Cigarettes    Last attempt to quit: 01/27/2009    Years since quitting: 10.1  . Smokeless tobacco: Never Used  Substance and Sexual Activity  . Alcohol use: No    Comment: stopped drinking 2010  . Drug use: No  . Sexual activity: Not on file  Lifestyle  . Physical activity:    Days per week: Not on file    Minutes per session: Not on file  . Stress: Not on file  Relationships  . Social connections:    Talks on phone: Not on file    Gets together: Not on file    Attends religious service: Not on file    Active member of club or organization: Not on file    Attends meetings of clubs or  organizations: Not on file    Relationship status: Not on file  . Intimate partner violence:    Fear of current or ex partner: Not on file    Emotionally abused: Not on file    Physically abused: Not on file    Forced sexual activity: Not on file  Other Topics Concern  . Not on file  Social History Narrative  . Not on file    Outpatient Medications Prior to Visit  Medication Sig Dispense Refill  . albuterol (PROVENTIL HFA;VENTOLIN HFA) 108 (90 Base) MCG/ACT inhaler Inhale 2 puffs into the lungs every 6 (six) hours as needed for wheezing or shortness of breath. 1 Inhaler 11  . hydrochlorothiazide (HYDRODIURIL) 25 MG tablet Take 1 tablet (25 mg total) by mouth daily. 90 tablet 1  . NIFEdipine (PROCARDIA XL/NIFEDICAL XL) 60 MG 24 hr tablet Take 1 tablet (60 mg total) by mouth daily. 90 tablet 1  . pravastatin (PRAVACHOL) 80 MG tablet Take 1 tablet by mouth once daily 90 tablet 0  . fluticasone (FLONASE) 50 MCG/ACT nasal spray Place 2 sprays into both nostrils daily. (Patient not taking: Reported on 05/16/2018) 16 g 2  . HYDROcodone-acetaminophen (NORCO) 5-325 MG tablet Take 2 tablets by mouth every 4 (four) hours as needed. (Patient not taking: Reported on 05/16/2018) 10 tablet 0  . loratadine (CLARITIN) 10 MG tablet Take 1 tablet (10 mg total) by mouth daily. (Patient not taking: Reported on 05/16/2018) 90 tablet 1  . benzonatate (TESSALON) 100 MG capsule Take 1 capsule by mouth every 8 (eight) hours for cough. 21 capsule 0  . loratadine-pseudoephedrine (CLARITIN-D 12-HOUR) 5-120 MG tablet Take 1 tablet by mouth 2 (two) times daily. (Patient not taking: Reported on 03/09/2019) 14 tablet 0   Facility-Administered Medications Prior to Visit  Medication Dose Route Frequency Provider Last Rate Last Dose  . 0.9 %  sodium chloride infusion  500 mL Intravenous Continuous Armbruster, Carlota Raspberry, MD        No Known Allergies  ROS Review of Systems  Constitutional: Negative.   HENT: Positive for  hearing loss (R>L; hearing aids).   Eyes: Positive for visual disturbance.       Recently diagnosed with Cataracts  Respiratory: Negative.   Cardiovascular: Negative.   Gastrointestinal: Negative.   Endocrine: Negative.   Genitourinary: Negative.   Musculoskeletal: Negative.   Skin: Negative.   Allergic/Immunologic:       Seasonal Allergies  Neurological: Positive for dizziness and headaches.  Hematological: Negative.   Psychiatric/Behavioral: Negative.       Objective:    Physical Exam  Constitutional: She is oriented to person, place, and time. She appears well-developed and well-nourished.  HENT:  Head: Normocephalic and atraumatic.  Eyes: Conjunctivae are normal.  Neck: Normal range of motion. Neck supple.  Cardiovascular: Normal rate, regular rhythm, normal heart sounds and intact distal pulses.  Pulmonary/Chest: Breath sounds normal.  Abdominal: Soft. Bowel sounds are normal.  Musculoskeletal: Normal range of motion.  Neurological: She is alert and oriented to person, place, and time. She has normal reflexes.  Skin: Skin is warm and dry.  Psychiatric: She has a normal mood and affect. Her behavior is normal. Judgment and thought content normal.  Nursing note and vitals reviewed.   BP 126/78 (BP Location: Left Arm, Patient Position: Sitting, Cuff Size: Small)   Pulse 84   Temp 97.9 F (36.6 C) (Oral)   Ht 5\' 4"  (1.626 m)   Wt 192 lb (87.1 kg)   SpO2 96%   BMI 32.96 kg/m  Wt Readings from Last 3 Encounters:  03/09/19 192 lb (87.1 kg)  09/08/18 194 lb (88 kg)  05/16/18 196 lb (88.9 kg)     Health Maintenance Due  Topic Date Due  . HIV Screening  06/09/1968  . DEXA SCAN  06/09/2018  . PNA vac Low Risk Adult (2 of 2 - PPSV23) 06/09/2018  . MAMMOGRAM  11/13/2018    There are no preventive Collier reminders to display for this patient.  Lab Results  Component Value Date   TSH 2.300 12/11/2017   Lab Results  Component Value Date   WBC 8.5 04/08/2017    HGB 13.4 04/08/2017   HCT 41.1 04/08/2017   MCV 92.2 04/08/2017   PLT 278 04/08/2017   Lab Results  Component Value Date   NA 144 12/11/2017   K 4.0 12/11/2017   CO2 25 12/11/2017   GLUCOSE 95 12/11/2017   BUN 13 12/11/2017   CREATININE 0.88 12/11/2017   BILITOT 0.3 12/11/2017   ALKPHOS 118 (H) 12/11/2017   AST 11 12/11/2017   ALT 10 12/11/2017   PROT 7.1 12/11/2017   ALBUMIN 4.4 12/11/2017   CALCIUM 10.5 (H) 12/11/2017   ANIONGAP 8 07/12/2016   Lab Results  Component Value Date   CHOL 182 12/11/2017   Lab Results  Component Value Date   HDL 51 12/11/2017   Lab Results  Component Value Date   LDLCALC 111 (H) 12/11/2017   Lab Results  Component Value Date   TRIG 99 12/11/2017   Lab Results  Component Value Date   CHOLHDL 3.6 12/11/2017   Lab Results  Component Value Date   HGBA1C 6.0 (A) 03/09/2019      Assessment & Plan:   1. Essential hypertension Antihypertensives are effective. Blood pressure is stable at 126/78 today. Continue as prescribed. She will continue to decrease high sodium intake, excessive alcohol intake, increase potassium intake, smoking cessation, and increase physical activity of at least 30 minutes of cardio activity daily. She will continue to follow Heart Healthy or DASH diet. - CBC with Differential - Comprehensive metabolic panel - TSH - Lipid Panel - HepB+HepC+HIV Panel  2. Post-nasal drip Continue Mucinex as needed and daily antihistamine. She will also increase fluid intake.   3. Bilateral hearing loss, unspecified hearing loss type Right ear hearing loss is > than left ear. Continue hearing aids.   4. Cataract, unspecified cataract type, unspecified laterality She will continue following up with Optometry as needed.   5. Seasonal allergies Stable today. She will continue antihistamine and nasal spray as needed.   6. Screening for breast cancer - MM Digital Screening; Future  7. Vitamin D deficiency - Vitamin D,  25-hydroxy  8. Vitamin B 12 deficiency - Vitamin B12  9. Screening for diabetes mellitus Hgb A1c is stable at 6.0. She will continue to decrease foods/beverages high in sugars and carbs and follow Heart Healthy or DASH diet. Increase physical activity to at least 30 minutes cardio exercise daily.  - POCT glycosylated hemoglobin (Hb A1C) - POCT urinalysis dipstick  10. Follow up She will follow up in 6 months.  She will follow up in 09/2019 for Pap Smear.   No orders of the defined types were placed in this encounter.  Orders Placed This Encounter  Procedures  . MM Digital Screening  . CBC with Differential  . Comprehensive metabolic panel  . TSH  . Lipid Panel  . Vitamin B12  . Vitamin D, 25-hydroxy  . HepB+HepC+HIV Panel  . POCT glycosylated hemoglobin (Hb A1C)  . POCT urinalysis dipstick    Referral Orders  No referral(s) requested today    Kathe Becton,  MSN, FNP-C [facility] [place], [place] [address] [phone]   Problem List Items Addressed This Visit      Cardiovascular and Mediastinum   Essential hypertension - Primary   Relevant Orders   CBC with Differential   Comprehensive metabolic panel   TSH   Lipid Panel   HepB+HepC+HIV Panel    Other Visit Diagnoses    Post-nasal drip       Bilateral hearing loss, unspecified hearing loss type       Cataract, unspecified cataract type, unspecified laterality       Seasonal allergies       Screening for breast cancer       Relevant Orders   MM Digital Screening   Vitamin D deficiency       Relevant Orders   Vitamin D, 25-hydroxy   Vitamin B 12 deficiency       Relevant Orders   Vitamin B12   Screening for diabetes mellitus       Relevant Orders   POCT glycosylated hemoglobin (Hb A1C) (Completed)   POCT urinalysis dipstick   Follow up          No orders of the defined types were placed in this encounter.   Follow-up: Return in about 6  months (around 09/09/2019).    Azzie Glatter, FNP

## 2019-03-10 LAB — COMPREHENSIVE METABOLIC PANEL
ALT: 12 IU/L (ref 0–32)
AST: 14 IU/L (ref 0–40)
Albumin/Globulin Ratio: 1.5 (ref 1.2–2.2)
Albumin: 4.4 g/dL (ref 3.8–4.8)
Alkaline Phosphatase: 109 IU/L (ref 39–117)
BUN/Creatinine Ratio: 10 — ABNORMAL LOW (ref 12–28)
BUN: 9 mg/dL (ref 8–27)
Bilirubin Total: 0.4 mg/dL (ref 0.0–1.2)
CO2: 25 mmol/L (ref 20–29)
Calcium: 10.1 mg/dL (ref 8.7–10.3)
Chloride: 97 mmol/L (ref 96–106)
Creatinine, Ser: 0.9 mg/dL (ref 0.57–1.00)
GFR calc Af Amer: 78 mL/min/{1.73_m2} (ref >59)
GFR calc non Af Amer: 67 mL/min/{1.73_m2} (ref >59)
Globulin, Total: 2.9 g/dL (ref 1.5–4.5)
Glucose: 101 mg/dL — ABNORMAL HIGH (ref 65–99)
Potassium: 3.2 mmol/L — ABNORMAL LOW (ref 3.5–5.2)
Sodium: 138 mmol/L (ref 134–144)
Total Protein: 7.3 g/dL (ref 6.0–8.5)

## 2019-03-10 LAB — VITAMIN B12: Vitamin B-12: 487 pg/mL (ref 232–1245)

## 2019-03-10 LAB — CBC WITH DIFFERENTIAL/PLATELET
Basophils Absolute: 0.1 10*3/uL (ref 0.0–0.2)
Basos: 1 %
EOS (ABSOLUTE): 0.1 10*3/uL (ref 0.0–0.4)
Eos: 1 %
Hematocrit: 40.7 % (ref 34.0–46.6)
Hemoglobin: 14.2 g/dL (ref 11.1–15.9)
Immature Grans (Abs): 0 10*3/uL (ref 0.0–0.1)
Immature Granulocytes: 0 %
Lymphocytes Absolute: 3.4 10*3/uL — ABNORMAL HIGH (ref 0.7–3.1)
Lymphs: 39 %
MCH: 30.5 pg (ref 26.6–33.0)
MCHC: 34.9 g/dL (ref 31.5–35.7)
MCV: 87 fL (ref 79–97)
Monocytes Absolute: 0.5 10*3/uL (ref 0.1–0.9)
Monocytes: 6 %
Neutrophils Absolute: 4.6 10*3/uL (ref 1.4–7.0)
Neutrophils: 53 %
Platelets: 313 10*3/uL (ref 150–450)
RBC: 4.66 x10E6/uL (ref 3.77–5.28)
RDW: 13.3 % (ref 11.7–15.4)
WBC: 8.7 10*3/uL (ref 3.4–10.8)

## 2019-03-10 LAB — HEPB+HEPC+HIV PANEL
HIV Screen 4th Generation wRfx: NONREACTIVE
Hep B C IgM: NEGATIVE
Hep B Core Total Ab: NEGATIVE
Hep B E Ab: NEGATIVE
Hep B E Ag: NEGATIVE
Hep B Surface Ab, Qual: NONREACTIVE
Hep C Virus Ab: <0.1 s/co ratio (ref 0.0–0.9)
Hepatitis B Surface Ag: NEGATIVE

## 2019-03-10 LAB — LIPID PANEL
Chol/HDL Ratio: 3.4 ratio (ref 0.0–4.4)
Cholesterol, Total: 175 mg/dL (ref 100–199)
HDL: 52 mg/dL (ref >39)
LDL Calculated: 99 mg/dL (ref 0–99)
Triglycerides: 120 mg/dL (ref 0–149)
VLDL Cholesterol Cal: 24 mg/dL (ref 5–40)

## 2019-03-10 LAB — TSH: TSH: 2.74 u[IU]/mL (ref 0.450–4.500)

## 2019-03-10 LAB — VITAMIN D 25 HYDROXY (VIT D DEFICIENCY, FRACTURES): Vit D, 25-Hydroxy: 11.4 ng/mL — ABNORMAL LOW (ref 30.0–100.0)

## 2019-03-13 ENCOUNTER — Other Ambulatory Visit: Payer: Self-pay | Admitting: Family Medicine

## 2019-03-13 ENCOUNTER — Encounter: Payer: Self-pay | Admitting: Family Medicine

## 2019-03-13 DIAGNOSIS — E559 Vitamin D deficiency, unspecified: Secondary | ICD-10-CM

## 2019-03-13 DIAGNOSIS — E876 Hypokalemia: Secondary | ICD-10-CM

## 2019-03-13 MED ORDER — POTASSIUM CHLORIDE CRYS ER 20 MEQ PO TBCR: 20.0000 meq | EXTENDED_RELEASE_TABLET | Freq: Every day | ORAL | 3 refills | Status: DC

## 2019-03-13 MED ORDER — VITAMIN D (ERGOCALCIFEROL) 1.25 MG (50000 UNIT) PO CAPS: 50000.0000 [IU] | ORAL_CAPSULE | ORAL | 3 refills | Status: DC

## 2019-03-16 ENCOUNTER — Telehealth: Payer: Self-pay

## 2019-03-16 NOTE — Telephone Encounter (Signed)
Patient would like to know if she should get tested for cov-19 because she is high risk. Patient doesn't have any symptoms and needs a order put in if you think she needs to be tested.

## 2019-03-16 NOTE — Telephone Encounter (Signed)
Patient notified

## 2019-03-26 DIAGNOSIS — Z20828 Contact with and (suspected) exposure to other viral communicable diseases: Secondary | ICD-10-CM | POA: Diagnosis not present

## 2019-04-06 ENCOUNTER — Telehealth: Payer: Self-pay

## 2019-04-06 ENCOUNTER — Other Ambulatory Visit: Payer: Self-pay | Admitting: Family Medicine

## 2019-04-06 DIAGNOSIS — J302 Other seasonal allergic rhinitis: Secondary | ICD-10-CM

## 2019-04-06 MED ORDER — LORATADINE 10 MG PO TABS: 10.0000 mg | ORAL_TABLET | Freq: Every day | ORAL | 1 refills | Status: DC

## 2019-04-06 NOTE — Telephone Encounter (Signed)
Called, no answer. Left a message that medication requested has been sent to pharmacy. Thanks!

## 2019-04-29 ENCOUNTER — Inpatient Hospital Stay: Admission: RE | Admit: 2019-04-29 | Payer: Medicare HMO | Source: Ambulatory Visit

## 2019-06-09 ENCOUNTER — Other Ambulatory Visit: Payer: Self-pay

## 2019-06-09 ENCOUNTER — Ambulatory Visit
Admission: RE | Admit: 2019-06-09 | Discharge: 2019-06-09 | Disposition: A | Discharge status: Home / Self Care | Payer: Medicare HMO | Source: Ambulatory Visit | Attending: Family Medicine | Admitting: Family Medicine

## 2019-06-09 DIAGNOSIS — Z1231 Encounter for screening mammogram for malignant neoplasm of breast: Secondary | ICD-10-CM | POA: Diagnosis not present

## 2019-06-09 DIAGNOSIS — Z1239 Encounter for other screening for malignant neoplasm of breast: Secondary | ICD-10-CM

## 2019-06-12 ENCOUNTER — Other Ambulatory Visit: Payer: Self-pay

## 2019-06-12 ENCOUNTER — Telehealth: Payer: Self-pay

## 2019-06-12 DIAGNOSIS — I10 Essential (primary) hypertension: Secondary | ICD-10-CM

## 2019-06-12 MED ORDER — NIFEDIPINE ER OSMOTIC RELEASE 60 MG PO TB24: 60.0000 mg | ORAL_TABLET | Freq: Every day | ORAL | 0 refills | Status: DC

## 2019-06-12 MED ORDER — PRAVASTATIN SODIUM 80 MG PO TABS: 80.0000 mg | ORAL_TABLET | Freq: Every day | ORAL | 0 refills | Status: DC

## 2019-06-12 NOTE — Telephone Encounter (Signed)
Medication sent to pharmacy and patient notified. 

## 2019-09-09 ENCOUNTER — Ambulatory Visit (INDEPENDENT_AMBULATORY_CARE_PROVIDER_SITE_OTHER): Payer: Medicare HMO | Admitting: Family Medicine

## 2019-09-09 ENCOUNTER — Encounter: Payer: Self-pay | Admitting: Family Medicine

## 2019-09-09 ENCOUNTER — Other Ambulatory Visit: Payer: Self-pay

## 2019-09-09 VITALS — BP 131/71 | HR 79 | Ht 64.0 in | Wt 192.8 lb

## 2019-09-09 DIAGNOSIS — Z131 Encounter for screening for diabetes mellitus: Secondary | ICD-10-CM

## 2019-09-09 DIAGNOSIS — I10 Essential (primary) hypertension: Secondary | ICD-10-CM | POA: Diagnosis not present

## 2019-09-09 DIAGNOSIS — Z23 Encounter for immunization: Secondary | ICD-10-CM

## 2019-09-09 DIAGNOSIS — Z0000 Encounter for general adult medical examination without abnormal findings: Secondary | ICD-10-CM | POA: Diagnosis not present

## 2019-09-09 DIAGNOSIS — J302 Other seasonal allergic rhinitis: Secondary | ICD-10-CM

## 2019-09-09 DIAGNOSIS — Z09 Encounter for follow-up examination after completed treatment for conditions other than malignant neoplasm: Secondary | ICD-10-CM

## 2019-09-09 DIAGNOSIS — R1031 Right lower quadrant pain: Secondary | ICD-10-CM | POA: Insufficient documentation

## 2019-09-09 LAB — POCT URINALYSIS DIPSTICK
Bilirubin, UA: NEGATIVE
Blood, UA: NEGATIVE
Glucose, UA: NEGATIVE
Ketones, UA: NEGATIVE
Leukocytes, UA: NEGATIVE
Nitrite, UA: NEGATIVE
Protein, UA: NEGATIVE
Spec Grav, UA: 1.01 (ref 1.010–1.025)
Urobilinogen, UA: 0.2 E.U./dL
pH, UA: 7 (ref 5.0–8.0)

## 2019-09-09 LAB — POCT GLYCOSYLATED HEMOGLOBIN (HGB A1C): Hemoglobin A1C: 5.7 % — AB (ref 4.0–5.6)

## 2019-09-09 LAB — GLUCOSE, POCT (MANUAL RESULT ENTRY): POC Glucose: 101 mg/dl — AB (ref 70–99)

## 2019-09-09 NOTE — Progress Notes (Signed)
Patient [facility]   Established Patient Office Visit  Subjective:  Patient ID: Teresa Collier, female    DOB: 1951-01-29  Age: 68 y.o. MRN: [id number]  CC:  Chief Complaint  Patient presents with  . Follow-up    6 mth follow   . Flu Vaccine    HPI Teresa Collier is a 68 year old female who presents for Follow Up today.   Past Medical History:  Diagnosis Date  . Blood transfusion without reported diagnosis    1973  . Cataract    "beginnings"  . Hearing loss in right ear   . Heart murmur   . Hyperlipidemia   . Hypertension   . Hypokalemia   . Seasonal allergies   . Vitamin D deficiency    Current Status: Since her last office visit, she has c/o right lower abdominal pain X 1 month. She states that at times the pain is intense. She denies fevers, chills, fatigue, recent infections, weight loss, and night sweats. No reports of GI problems such as diarrhea, and constipation. She has no reports of blood in stools, dysuria and hematuria. No depression or anxiety   Past Surgical History:  Procedure Laterality Date  . APPENDECTOMY    . BUNIONECTOMY    . COLONOSCOPY    . OPEN REDUCTION INTERNAL FIXATION (ORIF) FOOT LISFRANC FRACTURE Left 07/25/2016   Procedure: OPEN REDUCTION INTERNAL FIXATION (ORIF) FOOT LISFRANC FRACTURE;  Surgeon: Leandrew Koyanagi, MD;  Location: [place];  Service: Orthopedics;  Laterality: Left;  . ORIF ANKLE FRACTURE Left 07/25/2016   Procedure: OPEN REDUCTION INTERNAL FIXATION (ORIF) LEFT BIMALLEOLAR  ANKLE AND LISFRANC FRACTURES;  Surgeon: Leandrew Koyanagi, MD;  Location: [place];  Service: Orthopedics;  Laterality: Left;    Family History  Problem Relation Age of Onset  . Esophageal cancer Father   . Colon cancer Neg Hx   . Rectal cancer Neg Hx   . Stomach cancer Neg Hx     Social History   Socioeconomic History  . Marital status: Married    Spouse name: Not on file  .  Number of children: Not on file  . Years of education: Not on file  . Highest education level: Not on file  Occupational History  . Not on file  Social Needs  . Financial resource strain: Not on file  . Food insecurity    Worry: Not on file    Inability: Not on file  . Transportation needs    Medical: Not on file    Non-medical: Not on file  Tobacco Use  . Smoking status: Former Smoker    Packs/day: 2.00    Years: 20.00    Pack years: 40.00    Types: Cigarettes    Quit date: 01/27/2009    Years since quitting: 10.6  . Smokeless tobacco: Never Used  Substance and Sexual Activity  . Alcohol use: No    Comment: stopped drinking 2010  . Drug use: No  . Sexual activity: Yes    Partners: Male  Lifestyle  . Physical activity    Days per week: Not on file    Minutes per session: Not on file  . Stress: Not on file  Relationships  . Social Herbalist on phone: Not on file    Gets together: Not on file    Attends religious service: Not on file    Active member of club or organization: Not  on file    Attends meetings of clubs or organizations: Not on file    Relationship status: Not on file  . Intimate partner violence    Fear of current or ex partner: Not on file    Emotionally abused: Not on file    Physically abused: Not on file    Forced sexual activity: Not on file  Other Topics Concern  . Not on file  Social History Narrative  . Not on file    Outpatient Medications Prior to Visit  Medication Sig Dispense Refill  . albuterol (PROVENTIL HFA;VENTOLIN HFA) 108 (90 Base) MCG/ACT inhaler Inhale 2 puffs into the lungs every 6 (six) hours as needed for wheezing or shortness of breath. 1 Inhaler 11  . fluticasone (FLONASE) 50 MCG/ACT nasal spray Place 2 sprays into both nostrils daily. 16 g 2  . hydrochlorothiazide (HYDRODIURIL) 25 MG tablet Take 1 tablet (25 mg total) by mouth daily. 90 tablet 1  . loratadine (CLARITIN) 10 MG tablet Take 1 tablet (10 mg total) by  mouth daily. 90 tablet 1  . NIFEdipine (PROCARDIA XL/NIFEDICAL XL) 60 MG 24 hr tablet Take 1 tablet (60 mg total) by mouth daily. 90 tablet 0  . pravastatin (PRAVACHOL) 80 MG tablet Take 1 tablet (80 mg total) by mouth daily. 90 tablet 0  . Vitamin D, Ergocalciferol, (DRISDOL) 1.25 MG (50000 UT) CAPS capsule Take 1 capsule (50,000 Units total) by mouth every 7 (seven) days. 5 capsule 3  . potassium chloride SA (K-DUR) 20 MEQ tablet Take 1 tablet (20 mEq total) by mouth daily. 30 tablet 3   Facility-Administered Medications Prior to Visit  Medication Dose Route Frequency Provider Last Rate Last Dose  . 0.9 %  sodium chloride infusion  500 mL Intravenous Continuous Armbruster, Carlota Raspberry, MD        No Known Allergies  ROS Review of Systems  Constitutional: Negative.   HENT: Negative.   Eyes: Negative.   Respiratory: Negative.   Cardiovascular: Negative.   Gastrointestinal: Positive for abdominal pain (right lower quadrant).  Endocrine: Negative.   Genitourinary: Negative.   Musculoskeletal: Negative.   Skin: Negative.   Allergic/Immunologic: Negative.   Neurological: Negative.   Hematological: Negative.   Psychiatric/Behavioral: Negative.       Objective:    Physical Exam  Constitutional: She is oriented to person, place, and time. She appears well-developed and well-nourished.  HENT:  Head: Normocephalic and atraumatic.  Eyes: Conjunctivae are normal.  Neck: Normal range of motion. Neck supple.  Cardiovascular: Normal rate, regular rhythm, normal heart sounds and intact distal pulses.  Pulmonary/Chest: Effort normal and breath sounds normal.  Abdominal: Soft. Bowel sounds are normal.  Musculoskeletal: Normal range of motion.  Neurological: She is alert and oriented to person, place, and time. She has normal reflexes.  Skin: Skin is warm and dry.  Psychiatric: She has a normal mood and affect. Her behavior is normal. Judgment and thought content normal.  Nursing note and  vitals reviewed.   BP 131/71   Pulse 79   Ht 5\' 4"  (1.626 m)   Wt 192 lb 12.8 oz (87.5 kg)   SpO2 97%   BMI 33.09 kg/m  Wt Readings from Last 3 Encounters:  09/09/19 192 lb 12.8 oz (87.5 kg)  03/09/19 192 lb (87.1 kg)  09/08/18 194 lb (88 kg)     Health Maintenance Due  Topic Date Due  . DEXA SCAN  06/09/2018  . PNA vac Low Risk Adult (2 of 2 -  PPSV23) 06/09/2018  . INFLUENZA VACCINE  05/30/2019    There are no preventive care reminders to display for this patient.  Lab Results  Component Value Date   TSH 2.740 03/09/2019   Lab Results  Component Value Date   WBC 8.7 03/09/2019   HGB 14.2 03/09/2019   HCT 40.7 03/09/2019   MCV 87 03/09/2019   PLT 313 03/09/2019   Lab Results  Component Value Date   NA 138 03/09/2019   K 3.2 (L) 03/09/2019   CO2 25 03/09/2019   GLUCOSE 101 (H) 03/09/2019   BUN 9 03/09/2019   CREATININE 0.90 03/09/2019   BILITOT 0.4 03/09/2019   ALKPHOS 109 03/09/2019   AST 14 03/09/2019   ALT 12 03/09/2019   PROT 7.3 03/09/2019   ALBUMIN 4.4 03/09/2019   CALCIUM 10.1 03/09/2019   ANIONGAP 8 07/12/2016   Lab Results  Component Value Date   CHOL 175 03/09/2019   Lab Results  Component Value Date   HDL 52 03/09/2019   Lab Results  Component Value Date   LDLCALC 99 03/09/2019   Lab Results  Component Value Date   TRIG 120 03/09/2019   Lab Results  Component Value Date   CHOLHDL 3.4 03/09/2019   Lab Results  Component Value Date   HGBA1C 5.7 (A) 09/09/2019      Assessment & Plan:   1. Screening for diabetes mellitus - Urinalysis Dipstick - POCT HgB A1C - Glucose (CBG)  2. Healthcare maintenance - Urinalysis Dipstick - POCT HgB A1C - Glucose (CBG)  3. Essential hypertension The current medical regimen is effective; blood pressure is stable at 131/71  today; continue present plan and medications as prescribed. She will continue to take medications as prescribed, to decrease high sodium intake, excessive alcohol  intake, increase potassium intake, smoking cessation, and increase physical activity of at least 30 minutes of cardio activity daily. She will continue to follow Heart Healthy or DASH diet.  4. Seasonal allergies  5. Right lower quadrant abdominal pain Stable today. We will continue to monitor.   6. Flu vaccine need - Flu Vaccine QUAD 6+ mos PF IM (Fluarix Quad PF)  7. Follow up Follow up in 6 months for office visit. Follow for Pap Smear as can schedule.   No orders of the defined types were placed in this encounter.   Orders Placed This Encounter  Procedures  . Flu Vaccine QUAD 6+ mos PF IM (Fluarix Quad PF)  . Urinalysis Dipstick  . POCT HgB A1C  . Glucose (CBG)    Referral Orders  No referral(s) requested today    Kathe Becton,  MSN, FNP-BC [facility] [place], [place] [address] [phone] fax   Problem List Items Addressed This Visit      Cardiovascular and Mediastinum   Essential hypertension     Other   Seasonal allergies    Other Visit Diagnoses    Screening for diabetes mellitus    -  Primary   Relevant Orders   Urinalysis Dipstick (Completed)   POCT HgB A1C (Completed)   Glucose (CBG) (Completed)   Healthcare maintenance       Relevant Orders   Urinalysis Dipstick (Completed)   POCT HgB A1C (Completed)   Glucose (CBG) (Completed)   Right lower quadrant abdominal pain       Flu vaccine need       Relevant Orders   Flu Vaccine QUAD  6+ mos PF IM (Fluarix Quad PF) (Completed)   Follow up          No orders of the defined types were placed in this encounter.   Follow-up: No follow-ups on file.    Azzie Glatter, FNP

## 2019-09-12 ENCOUNTER — Other Ambulatory Visit: Payer: Self-pay | Admitting: Family Medicine

## 2019-09-12 DIAGNOSIS — I10 Essential (primary) hypertension: Secondary | ICD-10-CM

## 2019-09-14 MED ORDER — NIFEDIPINE ER OSMOTIC RELEASE 60 MG PO TB24: 60.0000 mg | ORAL_TABLET | Freq: Every day | ORAL | 0 refills | Status: DC

## 2019-10-15 ENCOUNTER — Other Ambulatory Visit: Payer: Self-pay | Admitting: Family Medicine

## 2019-10-30 ENCOUNTER — Other Ambulatory Visit: Payer: Self-pay | Admitting: Family Medicine

## 2019-11-02 ENCOUNTER — Ambulatory Visit (INDEPENDENT_AMBULATORY_CARE_PROVIDER_SITE_OTHER): Payer: Medicare HMO | Admitting: Family Medicine

## 2019-11-02 ENCOUNTER — Encounter: Payer: Self-pay | Admitting: Family Medicine

## 2019-11-02 ENCOUNTER — Other Ambulatory Visit: Payer: Self-pay

## 2019-11-02 VITALS — BP 120/69 | HR 72 | Temp 97.8°F | Ht 64.0 in | Wt 191.8 lb

## 2019-11-02 DIAGNOSIS — Z131 Encounter for screening for diabetes mellitus: Secondary | ICD-10-CM

## 2019-11-02 DIAGNOSIS — Z09 Encounter for follow-up examination after completed treatment for conditions other than malignant neoplasm: Secondary | ICD-10-CM

## 2019-11-02 DIAGNOSIS — I10 Essential (primary) hypertension: Secondary | ICD-10-CM

## 2019-11-02 DIAGNOSIS — E559 Vitamin D deficiency, unspecified: Secondary | ICD-10-CM | POA: Diagnosis not present

## 2019-11-02 DIAGNOSIS — Z124 Encounter for screening for malignant neoplasm of cervix: Secondary | ICD-10-CM

## 2019-11-02 DIAGNOSIS — J302 Other seasonal allergic rhinitis: Secondary | ICD-10-CM | POA: Diagnosis not present

## 2019-11-02 DIAGNOSIS — Z0000 Encounter for general adult medical examination without abnormal findings: Secondary | ICD-10-CM

## 2019-11-02 DIAGNOSIS — R0602 Shortness of breath: Secondary | ICD-10-CM | POA: Insufficient documentation

## 2019-11-02 LAB — POCT URINALYSIS DIPSTICK
Bilirubin, UA: NEGATIVE
Blood, UA: NEGATIVE
Glucose, UA: NEGATIVE
Ketones, UA: NEGATIVE
Nitrite, UA: NEGATIVE
Protein, UA: NEGATIVE
Spec Grav, UA: 1.01 (ref 1.010–1.025)
Urobilinogen, UA: 0.2 E.U./dL
pH, UA: 7 (ref 5.0–8.0)

## 2019-11-02 LAB — GLUCOSE, POCT (MANUAL RESULT ENTRY): POC Glucose: 96 mg/dl (ref 70–99)

## 2019-11-02 MED ORDER — ALBUTEROL SULFATE HFA 108 (90 BASE) MCG/ACT IN AERS: 2.0000 | INHALATION_SPRAY | Freq: Four times a day (QID) | RESPIRATORY_TRACT | 12 refills | Status: DC | PRN

## 2019-11-02 MED ORDER — VITAMIN D (ERGOCALCIFEROL) 1.25 MG (50000 UNIT) PO CAPS: 50000.0000 [IU] | ORAL_CAPSULE | ORAL | 3 refills | Status: DC

## 2019-11-02 MED ORDER — HYDROCHLOROTHIAZIDE 25 MG PO TABS: 25.0000 mg | ORAL_TABLET | Freq: Every day | ORAL | 6 refills | Status: DC

## 2019-11-02 MED ORDER — FLUTICASONE PROPIONATE 50 MCG/ACT NA SUSP: 2.0000 | Freq: Every day | NASAL | 12 refills | Status: DC

## 2019-11-02 MED ORDER — LORATADINE 10 MG PO TABS: 10.0000 mg | ORAL_TABLET | Freq: Every day | ORAL | 6 refills | Status: DC

## 2019-11-02 MED ORDER — PRAVASTATIN SODIUM 80 MG PO TABS: 80.0000 mg | ORAL_TABLET | Freq: Every day | ORAL | 6 refills | Status: DC

## 2019-11-02 MED ORDER — NIFEDIPINE ER OSMOTIC RELEASE 60 MG PO TB24: 60.0000 mg | ORAL_TABLET | Freq: Every day | ORAL | 6 refills | Status: DC

## 2019-11-02 NOTE — Progress Notes (Signed)
[name]   Pap Smear  Subjective:  Patient ID: Teresa Collier, female    DOB: 1951/01/28  Age: 69 y.o. MRN: [id number]  CC:  Chief Complaint  Patient presents with  . Follow-up    DM, HTN    HPI Teresa Collier is a 69 year old female who presents for Pap Smear.   Past Medical History:  Diagnosis Date  . Blood transfusion without reported diagnosis    1973  . Cataract    "beginnings"  . Hearing loss in right ear   . Heart murmur   . Hyperlipidemia   . Hypertension   . Hypokalemia   . Seasonal allergies   . Vitamin D deficiency     Gynecological Exam  Patient here for routine gynecological exam. She has previously had multiple Pap smears. Patient states that she has not had a history of an abnormal Pap smear. She denies abnormal vaginal discharge, vaginal itching, vaginal burning, or dyspareunia. Patient states that she does not perform monthly self breast exams. She does have any family history of cancer but unsure of what cancer. She typically follows a balanced diet but does not exercise routinely. Body mass index is 32.92.  Gynecologic History Patient's last menstrual period was about 6 years ago. No discharge, discomfort, irregular bleeding, abdominal pain. She is currently sexually active.   Current Status Since her last office visit, she is doing well with no complaints. She denies fevers, chills, fatigue, recent infections, weight loss, and night sweats. She has not had any headache, visual changes, dizziness, and falls. No chest pain, heart palpitations, cough and shortness of breath reported. No reports of GI problems such as nausea, vomiting, diarrhea, and constipation. She has no reports of blood in stools, dysuria and hematuria. No depression or anxiety reported. She denies suicidal ideations, homicidal ideations, or auditory hallucinations. She denies pain today.   Past Surgical History:  Procedure  Laterality Date  . APPENDECTOMY    . BUNIONECTOMY    . COLONOSCOPY    . OPEN REDUCTION INTERNAL FIXATION (ORIF) FOOT LISFRANC FRACTURE Left 07/25/2016   Procedure: OPEN REDUCTION INTERNAL FIXATION (ORIF) FOOT LISFRANC FRACTURE;  Surgeon: Leandrew Koyanagi, MD;  Location: [place];  Service: Orthopedics;  Laterality: Left;  . ORIF ANKLE FRACTURE Left 07/25/2016   Procedure: OPEN REDUCTION INTERNAL FIXATION (ORIF) LEFT BIMALLEOLAR  ANKLE AND LISFRANC FRACTURES;  Surgeon: Leandrew Koyanagi, MD;  Location: [place];  Service: Orthopedics;  Laterality: Left;    Family History  Problem Relation Age of Onset  . Esophageal cancer Father   . Colon cancer Neg Hx   . Rectal cancer Neg Hx   . Stomach cancer Neg Hx     Social History   Socioeconomic History  . Marital status: Married    Spouse name: Not on file  . Number of children: Not on file  . Years of education: Not on file  . Highest education level: Not on file  Occupational History  . Not on file  Tobacco Use  . Smoking status: Former Smoker    Packs/day: 2.00    Years: 20.00    Pack years: 40.00    Types: Cigarettes    Quit date: 01/27/2009    Years since quitting: 10.7  . Smokeless tobacco: Never Used  Substance and Sexual Activity  . Alcohol use: No    Comment: stopped drinking 2010  . Drug use: No  . Sexual activity:  Yes    Partners: Male  Other Topics Concern  . Not on file  Social History Narrative  . Not on file   Social Determinants of Health   Financial Resource Strain:   . Difficulty of Paying Living Expenses: Not on file  Food Insecurity:   . Worried About Charity fundraiser in the Last Year: Not on file  . Ran Out of Food in the Last Year: Not on file  Transportation Needs:   . Lack of Transportation (Medical): Not on file  . Lack of Transportation (Non-Medical): Not on file  Physical Activity:   . Days of Exercise per Week: Not on file  . Minutes of Exercise per Session: Not on  file  Stress:   . Feeling of Stress : Not on file  Social Connections:   . Frequency of Communication with Friends and Family: Not on file  . Frequency of Social Gatherings with Friends and Family: Not on file  . Attends Religious Services: Not on file  . Active Member of Clubs or Organizations: Not on file  . Attends Archivist Meetings: Not on file  . Marital Status: Not on file  Intimate Partner Violence:   . Fear of Current or Ex-Partner: Not on file  . Emotionally Abused: Not on file  . Physically Abused: Not on file  . Sexually Abused: Not on file    Outpatient Medications Prior to Visit  Medication Sig Dispense Refill  . albuterol (PROVENTIL HFA;VENTOLIN HFA) 108 (90 Base) MCG/ACT inhaler Inhale 2 puffs into the lungs every 6 (six) hours as needed for wheezing or shortness of breath. 1 Inhaler 11  . fluticasone (FLONASE) 50 MCG/ACT nasal spray Place 2 sprays into both nostrils daily. 16 g 2  . hydrochlorothiazide (HYDRODIURIL) 25 MG tablet Take 1 tablet by mouth once daily 90 tablet 2  . loratadine (CLARITIN) 10 MG tablet Take 1 tablet (10 mg total) by mouth daily. 90 tablet 1  . NIFEdipine (PROCARDIA XL/NIFEDICAL XL) 60 MG 24 hr tablet Take 1 tablet (60 mg total) by mouth daily. 90 tablet 0  . pravastatin (PRAVACHOL) 80 MG tablet Take 1 tablet by mouth once daily 90 tablet 0  . Vitamin D, Ergocalciferol, (DRISDOL) 1.25 MG (50000 UT) CAPS capsule Take 1 capsule (50,000 Units total) by mouth every 7 (seven) days. 5 capsule 3   Facility-Administered Medications Prior to Visit  Medication Dose Route Frequency Provider Last Rate Last Admin  . 0.9 %  sodium chloride infusion  500 mL Intravenous Continuous Armbruster, Carlota Raspberry, MD        No Known Allergies  ROS Review of Systems  Constitutional: Negative.   HENT: Negative.   Eyes: Negative.   Respiratory: Negative.   Cardiovascular: Negative.   Endocrine: Negative.   Genitourinary: Negative.    Allergic/Immunologic: Negative.   Neurological: Positive for dizziness (occasional ) and headaches (occasional ).      Objective:    Physical Exam  Constitutional: She is oriented to person, place, and time. She appears well-developed and well-nourished.  HENT:  Head: Normocephalic and atraumatic.  Eyes: Conjunctivae are normal.  Cardiovascular: Normal rate, regular rhythm, normal heart sounds and intact distal pulses.  Pulmonary/Chest: Effort normal and breath sounds normal.  Abdominal: Soft. Bowel sounds are normal.  Genitourinary:    Vagina normal.   Musculoskeletal:        General: Normal range of motion.     Cervical back: Normal range of motion and neck supple.  Neurological:  She is alert and oriented to person, place, and time. She has normal reflexes.  Skin: Skin is warm.  Psychiatric: She has a normal mood and affect. Her behavior is normal. Judgment and thought content normal.  Nursing note and vitals reviewed.   BP 120/69   Pulse 72   Temp 97.8 F (36.6 C) (Oral)   Ht 5\' 4"  (1.626 m)   Wt 191 lb 12.8 oz (87 kg)   SpO2 96%   BMI 32.92 kg/m  Wt Readings from Last 3 Encounters:  11/02/19 191 lb 12.8 oz (87 kg)  09/09/19 192 lb 12.8 oz (87.5 kg)  03/09/19 192 lb (87.1 kg)     Health Maintenance Due  Topic Date Due  . DEXA SCAN  06/09/2018  . PNA vac Low Risk Adult (2 of 2 - PPSV23) 06/09/2018    There are no preventive care reminders to display for this patient.  Lab Results  Component Value Date   TSH 2.740 03/09/2019   Lab Results  Component Value Date   WBC 8.7 03/09/2019   HGB 14.2 03/09/2019   HCT 40.7 03/09/2019   MCV 87 03/09/2019   PLT 313 03/09/2019   Lab Results  Component Value Date   NA 138 03/09/2019   K 3.2 (L) 03/09/2019   CO2 25 03/09/2019   GLUCOSE 101 (H) 03/09/2019   BUN 9 03/09/2019   CREATININE 0.90 03/09/2019   BILITOT 0.4 03/09/2019   ALKPHOS 109 03/09/2019   AST 14 03/09/2019   ALT 12 03/09/2019   PROT 7.3  03/09/2019   ALBUMIN 4.4 03/09/2019   CALCIUM 10.1 03/09/2019   ANIONGAP 8 07/12/2016   Lab Results  Component Value Date   CHOL 175 03/09/2019   Lab Results  Component Value Date   HDL 52 03/09/2019   Lab Results  Component Value Date   LDLCALC 99 03/09/2019   Lab Results  Component Value Date   TRIG 120 03/09/2019   Lab Results  Component Value Date   CHOLHDL 3.4 03/09/2019   Lab Results  Component Value Date   HGBA1C 5.7 (A) 09/09/2019    Assessment & Plan:   1. Encounter for Papanicolaou smear for cervical cancer screening - Pap IG w/ reflex to HPV when ASC-U ([facility])  2. Essential hypertension The current medical regimen is effective; blood pressure is stable at 120/69 today; continue present plan and medications as prescribed. She will continue to take medications as prescribed, to decrease high sodium intake, excessive alcohol intake, increase potassium intake, smoking cessation, and increase physical activity of at least 30 minutes of cardio activity daily. She will continue to follow Heart Healthy or DASH diet. - hydrochlorothiazide (HYDRODIURIL) 25 MG tablet; Take 1 tablet (25 mg total) by mouth daily.  Dispense: 30 tablet; Refill: 6 - NIFEdipine (PROCARDIA XL/NIFEDICAL XL) 60 MG 24 hr tablet; Take 1 tablet (60 mg total) by mouth daily.  Dispense: 30 tablet; Refill: 6  3. Shortness of breath - albuterol (VENTOLIN HFA) 108 (90 Base) MCG/ACT inhaler; Inhale 2 puffs into the lungs every 6 (six) hours as needed for wheezing or shortness of breath.  Dispense: 8 g; Refill: 12 - fluticasone (FLONASE) 50 MCG/ACT nasal spray; Place 2 sprays into both nostrils daily.  Dispense: 16 g; Refill: 12  4. Seasonal allergies - loratadine (CLARITIN) 10 MG tablet; Take 1 tablet (10 mg total) by mouth daily.  Dispense: 30 tablet; Refill: 6  5. Vitamin D deficiency - Vitamin D, Ergocalciferol, (DRISDOL) 1.25 MG (50000  UT) CAPS capsule; Take 1 capsule (50,000 Units total)  by mouth every 7 (seven) days.  Dispense: 5 capsule; Refill: 3  6. Screening for diabetes mellitus - Glucose (CBG)  7. Healthcare maintenance - Urinalysis Dipstick  8. Follow up She will follow up in 5 months.   Meds ordered this encounter  Medications  . albuterol (VENTOLIN HFA) 108 (90 Base) MCG/ACT inhaler    Sig: Inhale 2 puffs into the lungs every 6 (six) hours as needed for wheezing or shortness of breath.    Dispense:  8 g    Refill:  12  . fluticasone (FLONASE) 50 MCG/ACT nasal spray    Sig: Place 2 sprays into both nostrils daily.    Dispense:  16 g    Refill:  12  . hydrochlorothiazide (HYDRODIURIL) 25 MG tablet    Sig: Take 1 tablet (25 mg total) by mouth daily.    Dispense:  30 tablet    Refill:  6  . loratadine (CLARITIN) 10 MG tablet    Sig: Take 1 tablet (10 mg total) by mouth daily.    Dispense:  30 tablet    Refill:  6  . NIFEdipine (PROCARDIA XL/NIFEDICAL XL) 60 MG 24 hr tablet    Sig: Take 1 tablet (60 mg total) by mouth daily.    Dispense:  30 tablet    Refill:  6  . pravastatin (PRAVACHOL) 80 MG tablet    Sig: Take 1 tablet (80 mg total) by mouth daily.    Dispense:  30 tablet    Refill:  6  . Vitamin D, Ergocalciferol, (DRISDOL) 1.25 MG (50000 UT) CAPS capsule    Sig: Take 1 capsule (50,000 Units total) by mouth every 7 (seven) days.    Dispense:  5 capsule    Refill:  3   Orders Placed This Encounter  Procedures  . Urinalysis Dipstick  . Glucose (CBG)    Referral Orders  No referral(s) requested today    Kathe Becton,  MSN, FNP-BC [facility] [place], [place] [address] [phone] fax   Problem List Items Addressed This Visit      Cardiovascular and Mediastinum   Essential hypertension   Relevant Medications   hydrochlorothiazide (HYDRODIURIL) 25 MG tablet   NIFEdipine (PROCARDIA XL/NIFEDICAL XL) 60 MG 24 hr tablet   pravastatin  (PRAVACHOL) 80 MG tablet     Other   Seasonal allergies   Relevant Medications   loratadine (CLARITIN) 10 MG tablet    Other Visit Diagnoses    Encounter for Papanicolaou smear for cervical cancer screening    -  Primary   Relevant Orders   Pap IG w/ reflex to HPV when ASC-U ([facility])   Shortness of breath       Relevant Medications   albuterol (VENTOLIN HFA) 108 (90 Base) MCG/ACT inhaler   fluticasone (FLONASE) 50 MCG/ACT nasal spray   Vitamin D deficiency       Relevant Medications   Vitamin D, Ergocalciferol, (DRISDOL) 1.25 MG (50000 UT) CAPS capsule   Screening for diabetes mellitus       Relevant Orders   Glucose (CBG) (Completed)   Healthcare maintenance       Relevant Orders   Urinalysis Dipstick (Completed)   Follow up          Meds ordered this encounter  Medications  . albuterol (VENTOLIN HFA) 108 (90 Base) MCG/ACT inhaler  Sig: Inhale 2 puffs into the lungs every 6 (six) hours as needed for wheezing or shortness of breath.    Dispense:  8 g    Refill:  12  . fluticasone (FLONASE) 50 MCG/ACT nasal spray    Sig: Place 2 sprays into both nostrils daily.    Dispense:  16 g    Refill:  12  . hydrochlorothiazide (HYDRODIURIL) 25 MG tablet    Sig: Take 1 tablet (25 mg total) by mouth daily.    Dispense:  30 tablet    Refill:  6  . loratadine (CLARITIN) 10 MG tablet    Sig: Take 1 tablet (10 mg total) by mouth daily.    Dispense:  30 tablet    Refill:  6  . NIFEdipine (PROCARDIA XL/NIFEDICAL XL) 60 MG 24 hr tablet    Sig: Take 1 tablet (60 mg total) by mouth daily.    Dispense:  30 tablet    Refill:  6  . pravastatin (PRAVACHOL) 80 MG tablet    Sig: Take 1 tablet (80 mg total) by mouth daily.    Dispense:  30 tablet    Refill:  6  . Vitamin D, Ergocalciferol, (DRISDOL) 1.25 MG (50000 UT) CAPS capsule    Sig: Take 1 capsule (50,000 Units total) by mouth every 7 (seven) days.    Dispense:  5 capsule    Refill:  3    Follow-up: No follow-ups on  file.    Azzie Glatter, FNP

## 2019-11-03 LAB — IGP, RFX APTIMA HPV ASCU

## 2019-11-05 ENCOUNTER — Telehealth: Payer: Self-pay

## 2019-11-05 NOTE — Telephone Encounter (Signed)
-----   Message from Azzie Glatter, [place] sent at 11/05/2019 11:50 AM EST ----- Pap Smear results are negative for malignancy or lesions.   Keep follow up appointment. Please inform patient. Thank you.

## 2019-11-05 NOTE — Telephone Encounter (Signed)
Patient called with results. No voice ID.

## 2019-11-06 NOTE — Telephone Encounter (Signed)
Patient informed. 

## 2019-12-14 ENCOUNTER — Telehealth: Payer: Self-pay | Admitting: Family Medicine

## 2019-12-15 NOTE — Telephone Encounter (Signed)
Done

## 2019-12-16 ENCOUNTER — Telehealth: Payer: Self-pay | Admitting: Family Medicine

## 2019-12-18 NOTE — Telephone Encounter (Signed)
done

## 2020-02-01 ENCOUNTER — Other Ambulatory Visit: Payer: Self-pay | Admitting: Family Medicine

## 2020-02-01 DIAGNOSIS — I10 Essential (primary) hypertension: Secondary | ICD-10-CM

## 2020-03-01 ENCOUNTER — Ambulatory Visit: Payer: Medicare HMO | Admitting: Family Medicine

## 2020-03-09 ENCOUNTER — Ambulatory Visit: Payer: Medicare HMO | Admitting: Family Medicine

## 2020-04-22 DIAGNOSIS — H2513 Age-related nuclear cataract, bilateral: Secondary | ICD-10-CM | POA: Diagnosis not present

## 2020-04-22 DIAGNOSIS — Z0100 Encounter for examination of eyes and vision without abnormal findings: Secondary | ICD-10-CM | POA: Diagnosis not present

## 2020-04-22 DIAGNOSIS — I10 Essential (primary) hypertension: Secondary | ICD-10-CM | POA: Diagnosis not present

## 2020-04-22 DIAGNOSIS — E7800 Pure hypercholesterolemia, unspecified: Secondary | ICD-10-CM | POA: Diagnosis not present

## 2020-04-22 DIAGNOSIS — H524 Presbyopia: Secondary | ICD-10-CM | POA: Diagnosis not present

## 2020-06-29 DIAGNOSIS — M62838 Other muscle spasm: Secondary | ICD-10-CM

## 2020-06-29 HISTORY — DX: Other muscle spasm: M62.838

## 2020-07-20 ENCOUNTER — Encounter: Payer: Self-pay | Admitting: Family Medicine

## 2020-07-20 ENCOUNTER — Other Ambulatory Visit: Payer: Self-pay

## 2020-07-20 ENCOUNTER — Ambulatory Visit (INDEPENDENT_AMBULATORY_CARE_PROVIDER_SITE_OTHER): Payer: Medicare HMO | Admitting: Family Medicine

## 2020-07-20 VITALS — BP 118/62 | HR 85 | Temp 97.3°F | Ht 64.0 in | Wt 194.2 lb

## 2020-07-20 DIAGNOSIS — Z131 Encounter for screening for diabetes mellitus: Secondary | ICD-10-CM | POA: Diagnosis not present

## 2020-07-20 DIAGNOSIS — Z0000 Encounter for general adult medical examination without abnormal findings: Secondary | ICD-10-CM

## 2020-07-20 DIAGNOSIS — R8290 Unspecified abnormal findings in urine: Secondary | ICD-10-CM | POA: Diagnosis not present

## 2020-07-20 DIAGNOSIS — R0602 Shortness of breath: Secondary | ICD-10-CM | POA: Diagnosis not present

## 2020-07-20 DIAGNOSIS — I10 Essential (primary) hypertension: Secondary | ICD-10-CM | POA: Diagnosis not present

## 2020-07-20 DIAGNOSIS — Z09 Encounter for follow-up examination after completed treatment for conditions other than malignant neoplasm: Secondary | ICD-10-CM

## 2020-07-20 DIAGNOSIS — Z23 Encounter for immunization: Secondary | ICD-10-CM | POA: Diagnosis not present

## 2020-07-20 DIAGNOSIS — J302 Other seasonal allergic rhinitis: Secondary | ICD-10-CM | POA: Diagnosis not present

## 2020-07-20 LAB — POCT URINALYSIS DIPSTICK
Blood, UA: NEGATIVE
Glucose, UA: NEGATIVE
Nitrite, UA: NEGATIVE
Protein, UA: NEGATIVE
Spec Grav, UA: >=1.03 — AB (ref 1.010–1.025)
Urobilinogen, UA: 0.2 E.U./dL
pH, UA: 5.5 (ref 5.0–8.0)

## 2020-07-20 LAB — POCT GLYCOSYLATED HEMOGLOBIN (HGB A1C)
HbA1c POC (<> result, manual entry): 5.7 % (ref 4.0–5.6)
HbA1c, POC (controlled diabetic range): 5.7 % (ref 0.0–7.0)
HbA1c, POC (prediabetic range): 5.7 % (ref 5.7–6.4)
Hemoglobin A1C: 5.7 % — AB (ref 4.0–5.6)

## 2020-07-20 LAB — GLUCOSE, POCT (MANUAL RESULT ENTRY): POC Glucose: 109 mg/dl — AB (ref 70–99)

## 2020-07-20 MED ORDER — PRAVASTATIN SODIUM 80 MG PO TABS
80.0000 mg | ORAL_TABLET | Freq: Every day | ORAL | 3 refills | Status: DC
Start: 2020-07-20 — End: 2020-10-07

## 2020-07-20 MED ORDER — ALBUTEROL SULFATE HFA 108 (90 BASE) MCG/ACT IN AERS: 2.0000 | INHALATION_SPRAY | Freq: Four times a day (QID) | RESPIRATORY_TRACT | 12 refills | Status: DC | PRN

## 2020-07-20 MED ORDER — NIFEDIPINE ER OSMOTIC RELEASE 60 MG PO TB24: 60.0000 mg | ORAL_TABLET | Freq: Every day | ORAL | 3 refills | Status: DC

## 2020-07-20 MED ORDER — HYDROCHLOROTHIAZIDE 25 MG PO TABS: 25.0000 mg | ORAL_TABLET | Freq: Every day | ORAL | 3 refills | Status: DC

## 2020-07-20 MED ORDER — FLUTICASONE PROPIONATE 50 MCG/ACT NA SUSP: 2.0000 | Freq: Every day | NASAL | 12 refills | Status: DC

## 2020-07-20 MED ORDER — LORATADINE 10 MG PO TABS: 10.0000 mg | ORAL_TABLET | Freq: Every day | ORAL | 3 refills | Status: DC

## 2020-07-20 NOTE — Progress Notes (Signed)
Patient [facility]   Annual Physical  Subjective:  Patient ID: Teresa Collier, female    DOB: 09-01-1951  Age: 69 y.o. MRN: [id number]  CC:  Chief Complaint  Patient presents with  . Follow-up    Pt states she wants to check on her A1C,.  . Back Pain    Pt states the pain starts by inner thigh by her vagina then travels to her back. X32yrs ago.    HPI Teresa Collier is a 69 year old female who presents for her Annual Physical today.    Patient Active Problem List   Diagnosis Date Noted  . Shortness of breath 11/02/2019  . Vitamin D deficiency 11/02/2019  . Right lower quadrant abdominal pain 09/09/2019  . Bilateral hearing loss 03/09/2019  . Cataract 03/09/2019  . Seasonal allergies 03/09/2019  . Essential hypertension 10/16/2016  . Hyperlipidemia 10/16/2016  . Closed disp fracture of left lateral malleolus with routine healing 08/23/2016   Current Status: Since her last office visit, she is doing well with no complaints. She denies visual changes, chest pain, cough, shortness of breath, heart palpitations, and falls. She has occasional headaches and dizziness with position changes. Denies severe headaches, confusion, seizures, double vision, and blurred vision, nausea and vomiting. She denies fevers, chills, fatigue, recent infections, weight loss, and night sweats. Denies GI problems such as diarrhea, and constipation. She has no reports of blood in stools, dysuria and hematuria. No depression or anxiety reported today. She is taking all medications as prescribed. She denies pain today.   Past Medical History:  Diagnosis Date  . Blood transfusion without reported diagnosis    1973  . Cataract    "beginnings"  . Hearing loss in right ear   . Heart murmur   . Hyperlipidemia   . Hypertension   . Hypokalemia   . Seasonal allergies   . Vitamin D deficiency     Past Surgical History:  Procedure Laterality Date  .  APPENDECTOMY    . BUNIONECTOMY    . COLONOSCOPY    . OPEN REDUCTION INTERNAL FIXATION (ORIF) FOOT LISFRANC FRACTURE Left 07/25/2016   Procedure: OPEN REDUCTION INTERNAL FIXATION (ORIF) FOOT LISFRANC FRACTURE;  Surgeon: Leandrew Koyanagi, MD;  Location: [facility];  Service: Orthopedics;  Laterality: Left;  . ORIF ANKLE FRACTURE Left 07/25/2016   Procedure: OPEN REDUCTION INTERNAL FIXATION (ORIF) LEFT BIMALLEOLAR  ANKLE AND LISFRANC FRACTURES;  Surgeon: Leandrew Koyanagi, MD;  Location: [facility];  Service: Orthopedics;  Laterality: Left;    Family History  Problem Relation Age of Onset  . Esophageal cancer Father   . Colon cancer Neg Hx   . Rectal cancer Neg Hx   . Stomach cancer Neg Hx     Social History   Socioeconomic History  . Marital status: Married    Spouse name: Not on file  . Number of children: Not on file  . Years of education: Not on file  . Highest education level: Not on file  Occupational History  . Not on file  Tobacco Use  . Smoking status: Former Smoker    Packs/day: 2.00    Years: 20.00    Pack years: 40.00    Types: Cigarettes    Quit date: 01/27/2009    Years since quitting: 11.4  . Smokeless tobacco: Never Used  Vaping Use  . Vaping Use: Never used  Substance and Sexual Activity  . Alcohol use: No  Comment: stopped drinking 2010  . Drug use: No  . Sexual activity: Yes    Partners: Male  Other Topics Concern  . Not on file  Social History Narrative  . Not on file   Social Determinants of Health   Financial Resource Strain:   . Difficulty of Paying Living Expenses: Not on file  Food Insecurity:   . Worried About Charity fundraiser in the Last Year: Not on file  . Ran Out of Food in the Last Year: Not on file  Transportation Needs:   . Lack of Transportation (Medical): Not on file  . Lack of Transportation (Non-Medical): Not on file  Physical Activity:   . Days of Exercise per Week: Not on file  . Minutes of Exercise  per Session: Not on file  Stress:   . Feeling of Stress : Not on file  Social Connections:   . Frequency of Communication with Friends and Family: Not on file  . Frequency of Social Gatherings with Friends and Family: Not on file  . Attends Religious Services: Not on file  . Active Member of Clubs or Organizations: Not on file  . Attends Archivist Meetings: Not on file  . Marital Status: Not on file  Intimate Partner Violence:   . Fear of Current or Ex-Partner: Not on file  . Emotionally Abused: Not on file  . Physically Abused: Not on file  . Sexually Abused: Not on file    Outpatient Medications Prior to Visit  Medication Sig Dispense Refill  . albuterol (VENTOLIN HFA) 108 (90 Base) MCG/ACT inhaler Inhale 2 puffs into the lungs every 6 (six) hours as needed for wheezing or shortness of breath. 8 g 12  . fluticasone (FLONASE) 50 MCG/ACT nasal spray Place 2 sprays into both nostrils daily. 16 g 12  . hydrochlorothiazide (HYDRODIURIL) 25 MG tablet Take 1 tablet by mouth once daily 90 tablet 0  . NIFEdipine (PROCARDIA XL/NIFEDICAL XL) 60 MG 24 hr tablet Take 1 tablet (60 mg total) by mouth daily. 30 tablet 6  . pravastatin (PRAVACHOL) 80 MG tablet Take 1 tablet (80 mg total) by mouth daily. 30 tablet 6  . Vitamin D, Ergocalciferol, (DRISDOL) 1.25 MG (50000 UT) CAPS capsule Take 1 capsule (50,000 Units total) by mouth every 7 (seven) days. (Patient not taking: Reported on 07/20/2020) 5 capsule 3  . loratadine (CLARITIN) 10 MG tablet Take 1 tablet (10 mg total) by mouth daily. (Patient not taking: Reported on 07/20/2020) 30 tablet 6   Facility-Administered Medications Prior to Visit  Medication Dose Route Frequency Provider Last Rate Last Admin  . 0.9 %  sodium chloride infusion  500 mL Intravenous Continuous Armbruster, Carlota Raspberry, MD        No Known Allergies  ROS Review of Systems  Constitutional: Negative.   HENT: Negative.   Eyes: Negative.   Respiratory: Negative.    Cardiovascular: Negative.   Gastrointestinal: Negative.   Endocrine: Negative.   Genitourinary: Negative.   Musculoskeletal: Negative.   Skin: Negative.   Allergic/Immunologic: Negative.   Neurological: Positive for dizziness (occasional ) and headaches (occasional ).  Hematological: Negative.   Psychiatric/Behavioral: Negative.    Objective:    Physical Exam Vitals and nursing note reviewed.  Constitutional:      Appearance: Normal appearance.  HENT:     Head: Normocephalic and atraumatic.     Nose: Nose normal.     Mouth/Throat:     Mouth: Mucous membranes are moist.  Pharynx: Oropharynx is clear.  Cardiovascular:     Rate and Rhythm: Normal rate and regular rhythm.     Pulses: Normal pulses.     Heart sounds: Normal heart sounds.  Pulmonary:     Effort: Pulmonary effort is normal.     Breath sounds: Normal breath sounds.  Abdominal:     General: Bowel sounds are normal.     Palpations: Abdomen is soft.  Musculoskeletal:        General: Normal range of motion.     Cervical back: Normal range of motion and neck supple.  Skin:    General: Skin is warm and dry.  Neurological:     General: No focal deficit present.     Mental Status: She is alert and oriented to person, place, and time.  Psychiatric:        Mood and Affect: Mood normal.        Behavior: Behavior normal.        Thought Content: Thought content normal.        Judgment: Judgment normal.    BP 118/62 (BP Location: Right Arm, Patient Position: Sitting, Cuff Size: Large)   Pulse 85   Temp (!) 97.3 F (36.3 C)   Ht 5\' 4"  (1.626 m)   Wt 194 lb 3.2 oz (88.1 kg)   SpO2 98%   BMI 33.33 kg/m  Wt Readings from Last 3 Encounters:  07/20/20 194 lb 3.2 oz (88.1 kg)  11/02/19 191 lb 12.8 oz (87 kg)  09/09/19 192 lb 12.8 oz (87.5 kg)    Health Maintenance Due  Topic Date Due  . COVID-19 Vaccine (1) Never done  . DEXA SCAN  Never done  . PNA vac Low Risk Adult (2 of 2 - PPSV23) 06/09/2018  .  INFLUENZA VACCINE  05/29/2020    There are no preventive care reminders to display for this patient.  Lab Results  Component Value Date   TSH 3.230 07/20/2020   Lab Results  Component Value Date   WBC 9.4 07/20/2020   HGB 13.9 07/20/2020   HCT 42.2 07/20/2020   MCV 91 07/20/2020   PLT 329 07/20/2020   Lab Results  Component Value Date   NA 138 07/20/2020   K 3.8 07/20/2020   CO2 25 07/20/2020   GLUCOSE 93 07/20/2020   BUN 12 07/20/2020   CREATININE 0.92 07/20/2020   BILITOT 0.3 07/20/2020   ALKPHOS 102 07/20/2020   AST 15 07/20/2020   ALT 12 07/20/2020   PROT 7.0 07/20/2020   ALBUMIN 4.2 07/20/2020   CALCIUM 10.3 07/20/2020   ANIONGAP 8 07/12/2016   Lab Results  Component Value Date   CHOL 193 07/20/2020   Lab Results  Component Value Date   HDL 51 07/20/2020   Lab Results  Component Value Date   LDLCALC 116 (H) 07/20/2020   Lab Results  Component Value Date   TRIG 149 07/20/2020   Lab Results  Component Value Date   CHOLHDL 3.8 07/20/2020   Lab Results  Component Value Date   HGBA1C 5.7 (A) 07/20/2020   HGBA1C 5.7 07/20/2020   HGBA1C 5.7 07/20/2020   HGBA1C 5.7 07/20/2020   Assessment & Plan:   1. Annual physical exam Physical assessment within normal for age.  Follow-up for scheduled mammogram as needed.  Recommend monthly self breast exam Recommend daily multivitamin for women Recommend strength training in 150 minutes of cardiovascular exercise per week  2. Essential hypertension The current medical regimen is effective; blood  pressure is stable at 118/62 today; continue present plan and medications as prescribed. She will continue to take medications as prescribed, to decrease high sodium intake, excessive alcohol intake, increase potassium intake, smoking cessation, and increase physical activity of at least 30 minutes of cardio activity daily. She will continue to follow Heart Healthy or DASH diet. - CBC with Differential - Comprehensive  metabolic panel - Lipid Panel - TSH - Vitamin B12 - Vitamin D, 25-hydroxy - hydrochlorothiazide (HYDRODIURIL) 25 MG tablet; Take 1 tablet (25 mg total) by mouth daily.  Dispense: 90 tablet; Refill: 3 - NIFEdipine (PROCARDIA XL/NIFEDICAL XL) 60 MG 24 hr tablet; Take 1 tablet (60 mg total) by mouth daily.  Dispense: 90 tablet; Refill: 3  3. Abnormal urine finding - Urine Culture  4. Shortness of breath Stable. No signs or symptoms of respiratory distress noted or reported.  - albuterol (VENTOLIN HFA) 108 (90 Base) MCG/ACT inhaler; Inhale 2 puffs into the lungs every 6 (six) hours as needed for wheezing or shortness of breath.  Dispense: 8 g; Refill: 12 - fluticasone (FLONASE) 50 MCG/ACT nasal spray; Place 2 sprays into both nostrils daily.  Dispense: 16 g; Refill: 12  5. Seasonal allergies - loratadine (CLARITIN) 10 MG tablet; Take 1 tablet (10 mg total) by mouth daily.  Dispense: 90 tablet; Refill: 3  6. Screening for diabetes mellitus - Urinalysis Dipstick - POC Glucose (CBG) - POC HgB A1c  7. Healthcare maintenance - Flu Vaccine QUAD 6+ mos PF IM (Fluarix Quad PF)  8. Follow up She will follow up in 1 year for Annual Physical.  Meds ordered this encounter  Medications  . albuterol (VENTOLIN HFA) 108 (90 Base) MCG/ACT inhaler    Sig: Inhale 2 puffs into the lungs every 6 (six) hours as needed for wheezing or shortness of breath.    Dispense:  8 g    Refill:  12  . fluticasone (FLONASE) 50 MCG/ACT nasal spray    Sig: Place 2 sprays into both nostrils daily.    Dispense:  16 g    Refill:  12  . hydrochlorothiazide (HYDRODIURIL) 25 MG tablet    Sig: Take 1 tablet (25 mg total) by mouth daily.    Dispense:  90 tablet    Refill:  3  . NIFEdipine (PROCARDIA XL/NIFEDICAL XL) 60 MG 24 hr tablet    Sig: Take 1 tablet (60 mg total) by mouth daily.    Dispense:  90 tablet    Refill:  3  . pravastatin (PRAVACHOL) 80 MG tablet    Sig: Take 1 tablet (80 mg total) by mouth daily.     Dispense:  90 tablet    Refill:  3  . loratadine (CLARITIN) 10 MG tablet    Sig: Take 1 tablet (10 mg total) by mouth daily.    Dispense:  90 tablet    Refill:  3   Orders Placed This Encounter  Procedures  . Urine Culture  . Flu Vaccine QUAD 6+ mos PF IM (Fluarix Quad PF)  . CBC with Differential  . Comprehensive metabolic panel  . Lipid Panel  . TSH  . Vitamin B12  . Vitamin D, 25-hydroxy  . Urinalysis Dipstick  . POC Glucose (CBG)  . POC HgB A1c   Referral Orders  No referral(s) requested today   Kathe Becton,  MSN, FNP-BC [facility] [address] [place], [place] [address] [phone] fax  Problem List  Items Addressed This Visit      Cardiovascular and Mediastinum   Essential hypertension   Relevant Medications   hydrochlorothiazide (HYDRODIURIL) 25 MG tablet   NIFEdipine (PROCARDIA XL/NIFEDICAL XL) 60 MG 24 hr tablet   pravastatin (PRAVACHOL) 80 MG tablet   Other Relevant Orders   CBC with Differential (Completed)   Comprehensive metabolic panel (Completed)   Lipid Panel (Completed)   TSH (Completed)   Vitamin B12 (Completed)   Vitamin D, 25-hydroxy (Completed)     Other   Seasonal allergies   Relevant Medications   loratadine (CLARITIN) 10 MG tablet   Shortness of breath   Relevant Medications   albuterol (VENTOLIN HFA) 108 (90 Base) MCG/ACT inhaler   fluticasone (FLONASE) 50 MCG/ACT nasal spray    Other Visit Diagnoses    Annual physical exam    -  Primary   Abnormal urine finding       Relevant Orders   Urine Culture (Completed)   Screening for diabetes mellitus       Relevant Orders   Urinalysis Dipstick (Completed)   POC Glucose (CBG) (Completed)   POC HgB A1c (Completed)   Healthcare maintenance       Relevant Orders   Flu Vaccine QUAD 6+ mos PF IM (Fluarix Quad PF)   Follow up          Meds ordered this encounter  Medications   . albuterol (VENTOLIN HFA) 108 (90 Base) MCG/ACT inhaler    Sig: Inhale 2 puffs into the lungs every 6 (six) hours as needed for wheezing or shortness of breath.    Dispense:  8 g    Refill:  12  . fluticasone (FLONASE) 50 MCG/ACT nasal spray    Sig: Place 2 sprays into both nostrils daily.    Dispense:  16 g    Refill:  12  . hydrochlorothiazide (HYDRODIURIL) 25 MG tablet    Sig: Take 1 tablet (25 mg total) by mouth daily.    Dispense:  90 tablet    Refill:  3  . NIFEdipine (PROCARDIA XL/NIFEDICAL XL) 60 MG 24 hr tablet    Sig: Take 1 tablet (60 mg total) by mouth daily.    Dispense:  90 tablet    Refill:  3  . pravastatin (PRAVACHOL) 80 MG tablet    Sig: Take 1 tablet (80 mg total) by mouth daily.    Dispense:  90 tablet    Refill:  3  . loratadine (CLARITIN) 10 MG tablet    Sig: Take 1 tablet (10 mg total) by mouth daily.    Dispense:  90 tablet    Refill:  3    Follow-up: No follow-ups on file.    Azzie Glatter, FNP

## 2020-07-21 LAB — COMPREHENSIVE METABOLIC PANEL
ALT: 12 IU/L (ref 0–32)
AST: 15 IU/L (ref 0–40)
Albumin/Globulin Ratio: 1.5 (ref 1.2–2.2)
Albumin: 4.2 g/dL (ref 3.8–4.8)
Alkaline Phosphatase: 102 IU/L (ref 44–121)
BUN/Creatinine Ratio: 13 (ref 12–28)
BUN: 12 mg/dL (ref 8–27)
Bilirubin Total: 0.3 mg/dL (ref 0.0–1.2)
CO2: 25 mmol/L (ref 20–29)
Calcium: 10.3 mg/dL (ref 8.7–10.3)
Chloride: 100 mmol/L (ref 96–106)
Creatinine, Ser: 0.92 mg/dL (ref 0.57–1.00)
GFR calc Af Amer: 75 mL/min/{1.73_m2} (ref >59)
GFR calc non Af Amer: 65 mL/min/{1.73_m2} (ref >59)
Globulin, Total: 2.8 g/dL (ref 1.5–4.5)
Glucose: 93 mg/dL (ref 65–99)
Potassium: 3.8 mmol/L (ref 3.5–5.2)
Sodium: 138 mmol/L (ref 134–144)
Total Protein: 7 g/dL (ref 6.0–8.5)

## 2020-07-21 LAB — CBC WITH DIFFERENTIAL/PLATELET
Basophils Absolute: 0.1 10*3/uL (ref 0.0–0.2)
Basos: 1 %
EOS (ABSOLUTE): 0.1 10*3/uL (ref 0.0–0.4)
Eos: 1 %
Hematocrit: 42.2 % (ref 34.0–46.6)
Hemoglobin: 13.9 g/dL (ref 11.1–15.9)
Immature Grans (Abs): 0 10*3/uL (ref 0.0–0.1)
Immature Granulocytes: 0 %
Lymphocytes Absolute: 3.5 10*3/uL — ABNORMAL HIGH (ref 0.7–3.1)
Lymphs: 37 %
MCH: 29.8 pg (ref 26.6–33.0)
MCHC: 32.9 g/dL (ref 31.5–35.7)
MCV: 91 fL (ref 79–97)
Monocytes Absolute: 0.7 10*3/uL (ref 0.1–0.9)
Monocytes: 7 %
Neutrophils Absolute: 5.1 10*3/uL (ref 1.4–7.0)
Neutrophils: 54 %
Platelets: 329 10*3/uL (ref 150–450)
RBC: 4.66 x10E6/uL (ref 3.77–5.28)
RDW: 12.8 % (ref 11.7–15.4)
WBC: 9.4 10*3/uL (ref 3.4–10.8)

## 2020-07-21 LAB — LIPID PANEL
Chol/HDL Ratio: 3.8 ratio (ref 0.0–4.4)
Cholesterol, Total: 193 mg/dL (ref 100–199)
HDL: 51 mg/dL (ref >39)
LDL Chol Calc (NIH): 116 mg/dL — ABNORMAL HIGH (ref 0–99)
Triglycerides: 149 mg/dL (ref 0–149)
VLDL Cholesterol Cal: 26 mg/dL (ref 5–40)

## 2020-07-21 LAB — VITAMIN B12: Vitamin B-12: 1014 pg/mL (ref 232–1245)

## 2020-07-21 LAB — VITAMIN D 25 HYDROXY (VIT D DEFICIENCY, FRACTURES): Vit D, 25-Hydroxy: 36.9 ng/mL (ref 30.0–100.0)

## 2020-07-21 LAB — TSH: TSH: 3.23 u[IU]/mL (ref 0.450–4.500)

## 2020-07-22 ENCOUNTER — Encounter: Payer: Self-pay | Admitting: Family Medicine

## 2020-07-22 ENCOUNTER — Other Ambulatory Visit: Payer: Self-pay | Admitting: Family Medicine

## 2020-07-22 DIAGNOSIS — N390 Urinary tract infection, site not specified: Secondary | ICD-10-CM

## 2020-07-22 LAB — URINE CULTURE

## 2020-07-22 MED ORDER — SULFAMETHOXAZOLE-TRIMETHOPRIM 800-160 MG PO TABS: 1.0000 | ORAL_TABLET | Freq: Two times a day (BID) | ORAL | 0 refills | Status: DC

## 2020-07-22 MED ORDER — AMOXICILLIN 500 MG PO TABS: 500.0000 mg | ORAL_TABLET | Freq: Two times a day (BID) | ORAL | 0 refills | Status: AC

## 2020-07-26 ENCOUNTER — Telehealth: Payer: Self-pay

## 2020-07-26 NOTE — Telephone Encounter (Signed)
-----   Message from Azzie Glatter, FNP sent at 07/22/2020  7:19 PM EDT ----- Urine culture identified bacteria. Please inform patient that we have sent new Rx for Augmentin to pharmacy today. This antibiotic is specific to eliminate this bacteria. She is to take medication as prescribed. She is to complete all medication. Increase fluids. Keep follow up appointment as scheduled. Please inform patient.   Thank you.

## 2020-07-27 ENCOUNTER — Encounter: Payer: Self-pay | Admitting: Family Medicine

## 2020-07-27 ENCOUNTER — Other Ambulatory Visit: Payer: Self-pay | Admitting: Family Medicine

## 2020-07-27 ENCOUNTER — Telehealth: Payer: Self-pay | Admitting: Family Medicine

## 2020-07-27 DIAGNOSIS — M62838 Other muscle spasm: Secondary | ICD-10-CM

## 2020-07-27 MED ORDER — CYCLOBENZAPRINE HCL 10 MG PO TABS: 10.0000 mg | ORAL_TABLET | Freq: Three times a day (TID) | ORAL | 2 refills | Status: DC | PRN

## 2020-07-27 NOTE — Telephone Encounter (Signed)
Spoke with patient concerning muscle spasms today. New Rx for Flexeril sent to pharmacy today.

## 2020-07-28 NOTE — Progress Notes (Signed)
Pt was called to discuss her lab results. Pt stated she understood and there was nothing else I can do for her @ the moment and she will keep her f/u appt.

## 2020-09-10 DIAGNOSIS — Z20822 Contact with and (suspected) exposure to covid-19: Secondary | ICD-10-CM | POA: Diagnosis not present

## 2020-09-19 ENCOUNTER — Other Ambulatory Visit: Payer: Self-pay | Admitting: Family Medicine

## 2020-09-19 DIAGNOSIS — I10 Essential (primary) hypertension: Secondary | ICD-10-CM

## 2020-09-21 ENCOUNTER — Other Ambulatory Visit: Payer: Self-pay | Admitting: Family Medicine

## 2020-09-21 DIAGNOSIS — I10 Essential (primary) hypertension: Secondary | ICD-10-CM

## 2021-04-06 ENCOUNTER — Telehealth: Payer: Self-pay | Admitting: Nurse Practitioner

## 2021-04-06 NOTE — Telephone Encounter (Signed)
Pt was call to schedule her AWV appointment VM was full

## 2021-05-05 ENCOUNTER — Encounter: Payer: Self-pay | Admitting: Nurse Practitioner

## 2021-05-05 ENCOUNTER — Ambulatory Visit (INDEPENDENT_AMBULATORY_CARE_PROVIDER_SITE_OTHER): Payer: Medicare HMO | Admitting: Nurse Practitioner

## 2021-05-05 ENCOUNTER — Other Ambulatory Visit: Payer: Self-pay

## 2021-05-05 VITALS — BP 137/69 | HR 89 | Temp 97.9°F | Ht 64.0 in | Wt 205.0 lb

## 2021-05-05 DIAGNOSIS — Z131 Encounter for screening for diabetes mellitus: Secondary | ICD-10-CM | POA: Diagnosis not present

## 2021-05-05 DIAGNOSIS — E785 Hyperlipidemia, unspecified: Secondary | ICD-10-CM

## 2021-05-05 DIAGNOSIS — Z1231 Encounter for screening mammogram for malignant neoplasm of breast: Secondary | ICD-10-CM | POA: Diagnosis not present

## 2021-05-05 DIAGNOSIS — Z9189 Other specified personal risk factors, not elsewhere classified: Secondary | ICD-10-CM | POA: Diagnosis not present

## 2021-05-05 DIAGNOSIS — D729 Disorder of white blood cells, unspecified: Secondary | ICD-10-CM

## 2021-05-05 DIAGNOSIS — E559 Vitamin D deficiency, unspecified: Secondary | ICD-10-CM | POA: Diagnosis not present

## 2021-05-05 DIAGNOSIS — R5383 Other fatigue: Secondary | ICD-10-CM | POA: Diagnosis not present

## 2021-05-05 DIAGNOSIS — I10 Essential (primary) hypertension: Secondary | ICD-10-CM

## 2021-05-05 DIAGNOSIS — J302 Other seasonal allergic rhinitis: Secondary | ICD-10-CM

## 2021-05-05 LAB — POCT GLYCOSYLATED HEMOGLOBIN (HGB A1C): Hemoglobin A1C: 5.6 % (ref 4.0–5.6)

## 2021-05-05 LAB — POCT URINALYSIS DIPSTICK
Bilirubin, UA: NEGATIVE
Blood, UA: NEGATIVE
Glucose, UA: NEGATIVE
Ketones, UA: NEGATIVE
Nitrite, UA: NEGATIVE
Protein, UA: NEGATIVE
Spec Grav, UA: 1.015 (ref 1.010–1.025)
Urobilinogen, UA: 0.2 E.U./dL
pH, UA: 7 (ref 5.0–8.0)

## 2021-05-05 LAB — GLUCOSE, POCT (MANUAL RESULT ENTRY): POC Glucose: 105 mg/dl — AB (ref 70–99)

## 2021-05-05 MED ORDER — PRAVASTATIN SODIUM 80 MG PO TABS: 80.0000 mg | ORAL_TABLET | Freq: Every day | ORAL | 3 refills | Status: DC

## 2021-05-05 MED ORDER — NIFEDIPINE ER OSMOTIC RELEASE 60 MG PO TB24: 60.0000 mg | ORAL_TABLET | Freq: Every day | ORAL | 3 refills | Status: DC

## 2021-05-05 MED ORDER — HYDROCHLOROTHIAZIDE 25 MG PO TABS: 25.0000 mg | ORAL_TABLET | Freq: Every day | ORAL | 3 refills | Status: DC

## 2021-05-05 MED ORDER — LORATADINE 10 MG PO TABS: 10.0000 mg | ORAL_TABLET | Freq: Every day | ORAL | 3 refills | Status: DC

## 2021-05-05 MED ORDER — VITAMIN D (ERGOCALCIFEROL) 1.25 MG (50000 UNIT) PO CAPS: 50000.0000 [IU] | ORAL_CAPSULE | ORAL | 3 refills | Status: DC

## 2021-05-05 NOTE — Progress Notes (Signed)
[place] [place], [place]  [address] Phone:  [phone]   Fax:  [phone]   Established Patient Office Visit  Subjective:  Patient ID: Teresa Collier, female    DOB: 1951/10/06  Age: 71 y.o. MRN: [id number]  CC:  Chief Complaint  Patient presents with   Follow-up    HPI Teresa Collier presents for follow up. She  has a past medical history of Blood transfusion without reported diagnosis, Cataract, Hearing loss in right ear, Heart murmur, Hyperlipidemia, Hypertension, Hypokalemia, Muscle spasms of lower extremity (06/2020), Seasonal allergies, and Vitamin D deficiency.   Hypertension Patient is here for follow-up of elevated blood pressure. She is not exercising and is adherent to a low-salt diet. Cardiac symptoms: none. Patient denies chest pain, chest pressure/discomfort, claudication, dyspnea, exertional chest pressure/discomfort, fatigue, irregular heart beat, lower extremity edema, near-syncope, orthopnea, palpitations, paroxysmal nocturnal dyspnea, syncope, and tachypnea. Cardiovascular risk factors: advanced age (older than 6 for men, 4 for women), dyslipidemia, hypertension, obesity (BMI >= 30 kg/m2), and sedentary lifestyle. Use of agents associated with hypertension: none. History of target organ damage: none.  Past Medical History:  Diagnosis Date   Blood transfusion without reported diagnosis    1973   Cataract    "beginnings"   Hearing loss in right ear    Heart murmur    Hyperlipidemia    Hypertension    Hypokalemia    Muscle spasms of lower extremity 06/2020   Seasonal allergies    Vitamin D deficiency     Past Surgical History:  Procedure Laterality Date   APPENDECTOMY     BUNIONECTOMY     COLONOSCOPY     OPEN REDUCTION INTERNAL FIXATION (ORIF) FOOT LISFRANC FRACTURE Left 07/25/2016   Procedure: OPEN REDUCTION INTERNAL FIXATION (ORIF) FOOT LISFRANC FRACTURE;  Surgeon: Leandrew Koyanagi, MD;  Location: [facility];  Service: Orthopedics;  Laterality: Left;   ORIF ANKLE FRACTURE Left 07/25/2016   Procedure: OPEN REDUCTION INTERNAL FIXATION (ORIF) LEFT BIMALLEOLAR  ANKLE AND LISFRANC FRACTURES;  Surgeon: Leandrew Koyanagi, MD;  Location: [facility];  Service: Orthopedics;  Laterality: Left;    Family History  Problem Relation Age of Onset   Esophageal cancer Father    Colon cancer Neg Hx    Rectal cancer Neg Hx    Stomach cancer Neg Hx     Social History   Socioeconomic History   Marital status: Married    Spouse name: Not on file   Number of children: Not on file   Years of education: Not on file   Highest education level: Not on file  Occupational History   Not on file  Tobacco Use   Smoking status: Former    Packs/day: 2.00    Years: 20.00    Pack years: 40.00    Types: Cigarettes    Quit date: 01/27/2009    Years since quitting: 12.2   Smokeless tobacco: Never  Vaping Use   Vaping Use: Never used  Substance and Sexual Activity   Alcohol use: No    Comment: stopped drinking 2010   Drug use: No   Sexual activity: Yes    Partners: Male  Other Topics Concern   Not on file  Social History Narrative   Not on file   Social Determinants of Health   Financial Resource Strain: Not on file  Food Insecurity: Not on file  Transportation Needs: Not on file  Physical Activity: Not on  file  Stress: Not on file  Social Connections: Not on file  Intimate Partner Violence: Not on file    Outpatient Medications Prior to Visit  Medication Sig Dispense Refill   albuterol (VENTOLIN HFA) 108 (90 Base) MCG/ACT inhaler Inhale 2 puffs into the lungs every 6 (six) hours as needed for wheezing or shortness of breath. 8 g 12   cyclobenzaprine (FLEXERIL) 10 MG tablet Take 1 tablet (10 mg total) by mouth 3 (three) times daily as needed for muscle spasms. 30 tablet 2   fluticasone (FLONASE) 50 MCG/ACT nasal spray Place 2 sprays into both nostrils daily. 16 g 12    hydrochlorothiazide (HYDRODIURIL) 25 MG tablet Take 1 tablet (25 mg total) by mouth daily. 90 tablet 3   loratadine (CLARITIN) 10 MG tablet Take 1 tablet (10 mg total) by mouth daily. 90 tablet 3   NIFEdipine (PROCARDIA XL/NIFEDICAL XL) 60 MG 24 hr tablet TAKE 1 TABLET BY MOUTH EVERY DAY 90 tablet 2   pravastatin (PRAVACHOL) 80 MG tablet TAKE 1 TABLET BY MOUTH EVERY DAY 90 tablet 3   Vitamin D, Ergocalciferol, (DRISDOL) 1.25 MG (50000 UT) CAPS capsule Take 1 capsule (50,000 Units total) by mouth every 7 (seven) days. (Patient not taking: Reported on 07/20/2020) 5 capsule 3   Facility-Administered Medications Prior to Visit  Medication Dose Route Frequency Provider Last Rate Last Admin   0.9 %  sodium chloride infusion  500 mL Intravenous Continuous Armbruster, Carlota Raspberry, MD        No Known Allergies  ROS Review of Systems    Objective:    Physical Exam Constitutional:      Appearance: She is obese.  HENT:     Head: Normocephalic and atraumatic.  Cardiovascular:     Rate and Rhythm: Normal rate and regular rhythm.     Pulses: Normal pulses.     Heart sounds: Normal heart sounds.  Pulmonary:     Effort: Pulmonary effort is normal.  Abdominal:     Palpations: Abdomen is soft.  Musculoskeletal:        General: Normal range of motion.     Cervical back: Normal range of motion.  Skin:    General: Skin is warm and dry.  Neurological:     General: No focal deficit present.     Mental Status: She is alert.  Psychiatric:        Mood and Affect: Mood normal.        Behavior: Behavior normal.        Thought Content: Thought content normal.        Judgment: Judgment normal.   BP 137/69 (BP Location: Right Arm, Patient Position: Sitting, Cuff Size: Large)   Pulse 89   Temp 97.9 F (36.6 C) (Skin)   Ht 5\' 4"  (1.626 m)   Wt 205 lb (93 kg)   SpO2 95%   BMI 35.19 kg/m  Wt Readings from Last 3 Encounters:  05/05/21 205 lb (93 kg)  07/20/20 194 lb 3.2 oz (88.1 kg)  11/02/19 191 lb  12.8 oz (87 kg)     There are no preventive care reminders to display for this patient.   There are no preventive care reminders to display for this patient.  Lab Results  Component Value Date   TSH 2.930 05/05/2021   Lab Results  Component Value Date   WBC 9.4 07/20/2020   HGB 13.9 07/20/2020   HCT 42.2 07/20/2020   MCV 91 07/20/2020   PLT 329 07/20/2020  Lab Results  Component Value Date   NA 138 07/20/2020   K 3.8 07/20/2020   CO2 25 07/20/2020   GLUCOSE 93 07/20/2020   BUN 12 07/20/2020   CREATININE 0.92 07/20/2020   BILITOT 0.3 07/20/2020   ALKPHOS 102 07/20/2020   AST 15 07/20/2020   ALT 12 07/20/2020   PROT 7.0 07/20/2020   ALBUMIN 4.2 07/20/2020   CALCIUM 10.3 07/20/2020   ANIONGAP 8 07/12/2016   Lab Results  Component Value Date   HGBA1C 5.6 05/05/2021      Assessment & Plan:   Problem List Items Addressed This Visit       Cardiovascular and Mediastinum   Essential hypertension - Primary Stable  Encouraged on going compliance with current medication regimen Encouraged home monitoring and recording BP <130/80 Eating a heart-healthy diet with less salt Encouraged regular physical activity  Recommend Weight loss     Relevant Medications   hydrochlorothiazide (HYDRODIURIL) 25 MG tablet   NIFEdipine (PROCARDIA XL/NIFEDICAL XL) 60 MG 24 hr tablet   pravastatin (PRAVACHOL) 80 MG tablet   Other Relevant Orders   Urinalysis Dipstick (Completed)   Comp. Metabolic Panel (12) (Completed)     Other   Hyperlipidemia Stable  Labs pending   Relevant Medications   hydrochlorothiazide (HYDRODIURIL) 25 MG tablet   NIFEdipine (PROCARDIA XL/NIFEDICAL XL) 60 MG 24 hr tablet   pravastatin (PRAVACHOL) 80 MG tablet   Other Relevant Orders   Lipid panel (Completed)   Seasonal allergies Stable  Continue with current regimen.  No changes warranted. Good patient compliance.    Relevant Medications   loratadine (CLARITIN) 10 MG tablet   Vitamin D  deficiency   Relevant Medications   Vitamin D, Ergocalciferol, (DRISDOL) 1.25 MG (50000 UNIT) CAPS capsule   Other Visit Diagnoses     Screening for diabetes mellitus       Relevant Orders   HgB A1c (Completed)   POCT glucose (manual entry) (Completed)   Fatigue, unspecified type     Worsening labs pending   Relevant Orders   TSH (Completed)   Encounter for screening mammogram for malignant neoplasm of breast       Relevant Orders   MM 3D SCREEN BREAST BILATERAL   At risk for bone density loss       Relevant Orders   HM DEXA SCAN (Completed)   Abnormal white blood cell       Relevant Orders   Urine Culture (Completed)       Meds ordered this encounter  Medications   hydrochlorothiazide (HYDRODIURIL) 25 MG tablet    Sig: Take 1 tablet (25 mg total) by mouth daily.    Dispense:  90 tablet    Refill:  3    Order Specific Question:   Supervising Provider    Answer:   Tresa Garter [[id number]]   loratadine (CLARITIN) 10 MG tablet    Sig: Take 1 tablet (10 mg total) by mouth daily.    Dispense:  90 tablet    Refill:  3    Order Specific Question:   Supervising Provider    Answer:   Tresa Garter [[id number]]   NIFEdipine (PROCARDIA XL/NIFEDICAL XL) 60 MG 24 hr tablet    Sig: Take 1 tablet (60 mg total) by mouth daily.    Dispense:  90 tablet    Refill:  3    ZERO refills remain on this prescription. Your patient is requesting advance approval of refills for this medication to [facility]  MISSED DOSES    Order Specific Question:   Supervising Provider    Answer:   Tresa Garter [[id number]]   Vitamin D, Ergocalciferol, (DRISDOL) 1.25 MG (50000 UNIT) CAPS capsule    Sig: Take 1 capsule (50,000 Units total) by mouth every 7 (seven) days.    Dispense:  5 capsule    Refill:  3    Order Specific Question:   Supervising Provider    Answer:   Tresa Garter [[id number]]   pravastatin (PRAVACHOL) 80 MG tablet    Sig: Take 1 tablet (80 mg total) by mouth daily.     Dispense:  90 tablet    Refill:  3    ZERO refills remain on this prescription. Your patient is requesting advance approval of refills for this medication to [facility]    Order Specific Question:   Supervising Provider    Answer:   Tresa Garter [[id number]]    Follow-up: Return in about 6 months (around 11/05/2021) for Follow up HTN [id number].    Vevelyn Francois, NP

## 2021-05-05 NOTE — Patient Instructions (Signed)

## 2021-05-06 LAB — LIPID PANEL
Chol/HDL Ratio: 4.3 ratio (ref 0.0–4.4)
Cholesterol, Total: 252 mg/dL — ABNORMAL HIGH (ref 100–199)
HDL: 58 mg/dL (ref >39)
LDL Chol Calc (NIH): 151 mg/dL — ABNORMAL HIGH (ref 0–99)
Triglycerides: 235 mg/dL — ABNORMAL HIGH (ref 0–149)
VLDL Cholesterol Cal: 43 mg/dL — ABNORMAL HIGH (ref 5–40)

## 2021-05-06 LAB — COMP. METABOLIC PANEL (12)
AST: 21 IU/L (ref 0–40)
Albumin/Globulin Ratio: 1.5 (ref 1.2–2.2)
Albumin: 4.4 g/dL (ref 3.8–4.8)
Alkaline Phosphatase: 108 IU/L (ref 44–121)
BUN/Creatinine Ratio: 13 (ref 12–28)
BUN: 12 mg/dL (ref 8–27)
Bilirubin Total: 0.4 mg/dL (ref 0.0–1.2)
Calcium: 10.5 mg/dL — ABNORMAL HIGH (ref 8.7–10.3)
Chloride: 95 mmol/L — ABNORMAL LOW (ref 96–106)
Creatinine, Ser: 0.94 mg/dL (ref 0.57–1.00)
Globulin, Total: 2.9 g/dL (ref 1.5–4.5)
Glucose: 98 mg/dL (ref 65–99)
Potassium: 3.7 mmol/L (ref 3.5–5.2)
Sodium: 138 mmol/L (ref 134–144)
Total Protein: 7.3 g/dL (ref 6.0–8.5)
eGFR: 67 mL/min/{1.73_m2} (ref >59)

## 2021-05-06 LAB — TSH: TSH: 2.93 u[IU]/mL (ref 0.450–4.500)

## 2021-05-07 LAB — URINE CULTURE

## 2021-05-09 ENCOUNTER — Telehealth: Payer: Self-pay

## 2021-05-09 NOTE — Telephone Encounter (Signed)
-----   Message from Vevelyn Francois, NP sent at 05/09/2021  8:18 AM EDT ----- MyChart message sent to the patient. However additional follow up maybe needed. Thanks

## 2021-05-09 NOTE — Telephone Encounter (Signed)
Patient notified of results. Patient stated she is taking pravastatin daily. No additional questions.

## 2021-05-20 DIAGNOSIS — Z20822 Contact with and (suspected) exposure to covid-19: Secondary | ICD-10-CM | POA: Diagnosis not present

## 2021-06-21 ENCOUNTER — Ambulatory Visit (INDEPENDENT_AMBULATORY_CARE_PROVIDER_SITE_OTHER): Payer: Medicare HMO

## 2021-06-21 ENCOUNTER — Other Ambulatory Visit: Payer: Self-pay

## 2021-06-21 ENCOUNTER — Encounter: Payer: Self-pay | Admitting: Orthopaedic Surgery

## 2021-06-21 ENCOUNTER — Ambulatory Visit (INDEPENDENT_AMBULATORY_CARE_PROVIDER_SITE_OTHER): Payer: Medicare HMO | Admitting: Orthopaedic Surgery

## 2021-06-21 DIAGNOSIS — S8262XD Displaced fracture of lateral malleolus of left fibula, subsequent encounter for closed fracture with routine healing: Secondary | ICD-10-CM

## 2021-06-21 NOTE — Progress Notes (Signed)
Office Visit Note   Patient: Teresa Collier           Date of Birth: [birth year]           MRN: [id number] Visit Date: 06/21/2021              Requested by: No referring provider defined for this encounter. PCP: No primary care provider on file.   Assessment & Plan: Visit Diagnoses:  1. Closed disp fracture of left lateral malleolus with routine healing     Plan: In terms of the ankle this is doing well I do not think there is any problems there.  She seems to be describing symptoms related to midfoot arthritis versus symptomatic hardware.  It is difficult to assess this accurately on x-ray therefore we will need a CT scan to obtain better visualization.  Follow-up after the CT scan.  I did recommend shoewear with better arch supports.  Follow-Up Instructions: Return for after CT scan.   Orders:  Orders Placed This Encounter  Procedures   XR Ankle Complete Left   XR Foot Complete Left   CT FOOT LEFT WO CONTRAST   No orders of the defined types were placed in this encounter.     Procedures: No procedures performed   Clinical Data: No additional findings.   Subjective: Chief Complaint  Patient presents with   Left Foot - Follow-up    Ms. Moorefield is a 70 year old female who left lateral malleolus ankle fracture and Lisfranc fracture in 2017.  She did well from the surgery but recently she started to have pain that has progressively gotten worse over the last year.  She denies any new injuries.  She states that the pain comes and goes and at worst a 7 out of 10.  She does feel some swelling around the midfoot and along the first ray up into the ankle.  She takes Tylenol for the pain.  Walking long distances makes the pain worse.  Denies any numbness and tingling that is new.   Review of Systems  Constitutional: Negative.   HENT: Negative.    Eyes: Negative.   Respiratory: Negative.    Cardiovascular: Negative.   Endocrine: Negative.   Musculoskeletal: Negative.    Neurological: Negative.   Hematological: Negative.   Psychiatric/Behavioral: Negative.    All other systems reviewed and are negative.   Objective: Vital Signs: There were no vitals taken for this visit.  Physical Exam Vitals and nursing note reviewed.  Constitutional:      Appearance: She is well-developed.  HENT:     Head: Normocephalic and atraumatic.  Pulmonary:     Effort: Pulmonary effort is normal.  Abdominal:     Palpations: Abdomen is soft.  Musculoskeletal:     Cervical back: Neck supple.  Skin:    General: Skin is warm.     Capillary Refill: Capillary refill takes less than 2 seconds.  Neurological:     Mental Status: She is alert and oriented to person, place, and time.  Psychiatric:        Behavior: Behavior normal.        Thought Content: Thought content normal.        Judgment: Judgment normal.    Ortho Exam Examination of the left ankle shows a fully healed lateral scar.  She has good range of motion of the ankle without pain.  No Achilles contracture. Examination of the left foot shows fully healed surgical scars.  She does not have any significant  hardware irritation with palpation.  She feels deep pain within the midfoot that is worse with standing. Specialty Comments:  No specialty comments available.  Imaging: XR Ankle Complete Left  Result Date: 06/21/2021 Status post ORIF left lateral malleolus fracture without hardware complications.  XR Foot Complete Left  Result Date: 06/21/2021 Status post ORIF first TMT joint with hardware present.  No complications.  Dorsal beaking of the TMT joint consistent with arthritic changes    PMFS History: Patient Active Problem List   Diagnosis Date Noted   Shortness of breath 11/02/2019   Vitamin D deficiency 11/02/2019   Right lower quadrant abdominal pain 09/09/2019   Bilateral hearing loss 03/09/2019   Cataract 03/09/2019   Seasonal allergies 03/09/2019   Essential hypertension 10/16/2016    Hyperlipidemia 10/16/2016   Closed disp fracture of left lateral malleolus with routine healing 08/23/2016   Past Medical History:  Diagnosis Date   Blood transfusion without reported diagnosis    1973   Cataract    "beginnings"   Hearing loss in right ear    Heart murmur    Hyperlipidemia    Hypertension    Hypokalemia    Muscle spasms of lower extremity 06/2020   Seasonal allergies    Vitamin D deficiency     Family History  Problem Relation Age of Onset   Esophageal cancer Father    Colon cancer Neg Hx    Rectal cancer Neg Hx    Stomach cancer Neg Hx     Past Surgical History:  Procedure Laterality Date   APPENDECTOMY     BUNIONECTOMY     COLONOSCOPY     OPEN REDUCTION INTERNAL FIXATION (ORIF) FOOT LISFRANC FRACTURE Left 07/25/2016   Procedure: OPEN REDUCTION INTERNAL FIXATION (ORIF) FOOT LISFRANC FRACTURE;  Surgeon: Leandrew Koyanagi, MD;  Location: [facility];  Service: Orthopedics;  Laterality: Left;   ORIF ANKLE FRACTURE Left 07/25/2016   Procedure: OPEN REDUCTION INTERNAL FIXATION (ORIF) LEFT BIMALLEOLAR  ANKLE AND LISFRANC FRACTURES;  Surgeon: Leandrew Koyanagi, MD;  Location: [facility];  Service: Orthopedics;  Laterality: Left;   Social History   Occupational History   Not on file  Tobacco Use   Smoking status: Former    Packs/day: 2.00    Years: 20.00    Pack years: 40.00    Types: Cigarettes    Quit date: 01/27/2009    Years since quitting: 12.4   Smokeless tobacco: Never  Vaping Use   Vaping Use: Never used  Substance and Sexual Activity   Alcohol use: No    Comment: stopped drinking 2010   Drug use: No   Sexual activity: Yes    Partners: Male

## 2021-07-12 ENCOUNTER — Telehealth: Payer: Self-pay

## 2021-07-12 NOTE — Telephone Encounter (Signed)
Patient called, left VM to return the call to the office to schedule an AWV with a nurse.

## 2021-07-19 ENCOUNTER — Ambulatory Visit: Payer: Medicaid Other | Admitting: Family Medicine

## 2021-07-19 ENCOUNTER — Ambulatory Visit: Payer: Medicaid Other | Admitting: Nurse Practitioner

## 2021-07-21 ENCOUNTER — Telehealth (INDEPENDENT_AMBULATORY_CARE_PROVIDER_SITE_OTHER): Payer: Self-pay | Admitting: Nurse Practitioner

## 2021-07-21 NOTE — Telephone Encounter (Signed)
LVM for pt to rtn my call to schedule AWV-I. Please schedule this appt if pt calls the office.

## 2021-08-03 ENCOUNTER — Other Ambulatory Visit: Payer: Self-pay | Admitting: *Deleted

## 2021-08-03 ENCOUNTER — Encounter: Payer: Self-pay | Admitting: *Deleted

## 2021-08-03 NOTE — Patient Instructions (Signed)
Error- please disregard

## 2021-08-05 DIAGNOSIS — Z20822 Contact with and (suspected) exposure to covid-19: Secondary | ICD-10-CM | POA: Diagnosis not present

## 2021-08-10 DIAGNOSIS — J309 Allergic rhinitis, unspecified: Secondary | ICD-10-CM | POA: Diagnosis not present

## 2021-08-10 DIAGNOSIS — R0982 Postnasal drip: Secondary | ICD-10-CM | POA: Diagnosis not present

## 2021-08-10 DIAGNOSIS — H6593 Unspecified nonsuppurative otitis media, bilateral: Secondary | ICD-10-CM | POA: Diagnosis not present

## 2021-08-10 DIAGNOSIS — Z20822 Contact with and (suspected) exposure to covid-19: Secondary | ICD-10-CM | POA: Diagnosis not present

## 2021-08-10 DIAGNOSIS — R059 Cough, unspecified: Secondary | ICD-10-CM | POA: Diagnosis not present

## 2021-11-06 ENCOUNTER — Other Ambulatory Visit: Payer: Self-pay

## 2021-11-06 ENCOUNTER — Ambulatory Visit (INDEPENDENT_AMBULATORY_CARE_PROVIDER_SITE_OTHER): Payer: Medicare HMO | Admitting: Nurse Practitioner

## 2021-11-06 ENCOUNTER — Encounter: Payer: Self-pay | Admitting: Nurse Practitioner

## 2021-11-06 ENCOUNTER — Ambulatory Visit (HOSPITAL_COMMUNITY)
Admission: RE | Admit: 2021-11-06 | Discharge: 2021-11-06 | Disposition: A | Discharge status: Home / Self Care | Payer: Medicare HMO | Source: Ambulatory Visit | Attending: Nurse Practitioner | Admitting: Nurse Practitioner

## 2021-11-06 VITALS — BP 121/63 | HR 81 | Temp 97.9°F | Ht 64.0 in | Wt 187.4 lb

## 2021-11-06 DIAGNOSIS — Z1329 Encounter for screening for other suspected endocrine disorder: Secondary | ICD-10-CM | POA: Diagnosis not present

## 2021-11-06 DIAGNOSIS — E785 Hyperlipidemia, unspecified: Secondary | ICD-10-CM | POA: Diagnosis not present

## 2021-11-06 DIAGNOSIS — I10 Essential (primary) hypertension: Secondary | ICD-10-CM

## 2021-11-06 DIAGNOSIS — R0602 Shortness of breath: Secondary | ICD-10-CM | POA: Insufficient documentation

## 2021-11-06 DIAGNOSIS — R0600 Dyspnea, unspecified: Secondary | ICD-10-CM | POA: Diagnosis not present

## 2021-11-06 NOTE — Progress Notes (Signed)
[place], [place]  [address] Phone:  [phone]   Fax:  [phone]   Established Patient Office Visit  Subjective:  Patient ID: Teresa Collier, female    DOB: [birth year]  Age: 71 y.o. MRN: [id number]  CC:  Chief Complaint  Patient presents with   Follow-up    Pt is here today for his 6 month follow up visit. Pt states that Teresa Collier has been having shortness of breath when walking and talking on her phone at the same time x 6 months.  Pt also states that Teresa Collier has been having nausea every morning off and on.     HPI Teresa Collier presents for follow up. Teresa Collier  has a past medical history of Blood transfusion without reported diagnosis, Cataract, Hearing loss in right ear, Heart murmur, Hyperlipidemia, Hypertension, Hypokalemia, Muscle spasms of lower extremity (06/2020), Seasonal allergies, and Vitamin D deficiency.    Dyspnea Patient complains of shortness of breath. Symptoms occur with walking. Symptoms began 6 months ago, stable since. Associated symptoms include  difficulty breathing. Teresa Collier denies chest pain, constant cough, cough after eating, drainage from nose, dry cough, dyspnea on exertion, dyspnea when laying down, frequent throat clearing, morning cough, negative respiratory, no cough, post nasal drip, productive cough, sputum production, tightness in chest, and unresolving pneumonia. Teresa Collier has not had recent travel. Weight has decreased 18 pounds over last 6 mons . Teresa Collier has changed her diet. Teresa Collier is eating vegetables. Teresa Collier is smaller portions. Symptoms are exacerbated by emotional stress and walking. Symptoms are alleviated by nothing. Left ankle and foot swelling previous surgery on both feet.  Teresa Collier has hypertension and is currently prescribed nifedipine 60 mg and hydrochlorothiazide.  Teresa Collier reports Teresa Collier not taking the nifedipine.   Past Medical History:  Diagnosis Date   Blood transfusion without reported diagnosis    1973   Cataract     "beginnings"   Hearing loss in right ear    Heart murmur    Hyperlipidemia    Hypertension    Hypokalemia    Muscle spasms of lower extremity 06/2020   Seasonal allergies    Vitamin D deficiency     Past Surgical History:  Procedure Laterality Date   APPENDECTOMY     BUNIONECTOMY     COLONOSCOPY     OPEN REDUCTION INTERNAL FIXATION (ORIF) FOOT LISFRANC FRACTURE Left 07/25/2016   Procedure: OPEN REDUCTION INTERNAL FIXATION (ORIF) FOOT LISFRANC FRACTURE;  Surgeon: Leandrew Koyanagi, MD;  Location: [facility];  Service: Orthopedics;  Laterality: Left;   ORIF ANKLE FRACTURE Left 07/25/2016   Procedure: OPEN REDUCTION INTERNAL FIXATION (ORIF) LEFT BIMALLEOLAR  ANKLE AND LISFRANC FRACTURES;  Surgeon: Leandrew Koyanagi, MD;  Location: [facility];  Service: Orthopedics;  Laterality: Left;    Family History  Problem Relation Age of Onset   Esophageal cancer Father    Colon cancer Neg Hx    Rectal cancer Neg Hx    Stomach cancer Neg Hx     Social History   Socioeconomic History   Marital status: Married    Spouse name: Not on file   Number of children: Not on file   Years of education: Not on file   Highest education level: Not on file  Occupational History   Not on file  Tobacco Use   Smoking status: Former    Packs/day: 2.00    Years: 20.00    Pack years: 40.00    Types:  Cigarettes    Quit date: 01/27/2009    Years since quitting: 12.7   Smokeless tobacco: Never  Vaping Use   Vaping Use: Never used  Substance and Sexual Activity   Alcohol use: Yes    Comment: stopped drinking 2010   Drug use: No   Sexual activity: Yes    Partners: Male  Other Topics Concern   Not on file  Social History Narrative   Not on file   Social Determinants of Health   Financial Resource Strain: Not on file  Food Insecurity: Not on file  Transportation Needs: Not on file  Physical Activity: Not on file  Stress: Not on file  Social Connections: Not on file  Intimate  Partner Violence: Not on file    Outpatient Medications Prior to Visit  Medication Sig Dispense Refill   albuterol (VENTOLIN HFA) 108 (90 Base) MCG/ACT inhaler Inhale 2 puffs into the lungs every 6 (six) hours as needed for wheezing or shortness of breath. 8 g 12   cyclobenzaprine (FLEXERIL) 10 MG tablet Take 1 tablet (10 mg total) by mouth 3 (three) times daily as needed for muscle spasms. 30 tablet 2   fluticasone (FLONASE) 50 MCG/ACT nasal spray Place 2 sprays into both nostrils daily. 16 g 12   hydrochlorothiazide (HYDRODIURIL) 25 MG tablet Take 1 tablet (25 mg total) by mouth daily. 90 tablet 3   loratadine (CLARITIN) 10 MG tablet Take 1 tablet (10 mg total) by mouth daily. 90 tablet 3   pravastatin (PRAVACHOL) 80 MG tablet Take 1 tablet (80 mg total) by mouth daily. 90 tablet 3   NIFEdipine (PROCARDIA XL/NIFEDICAL XL) 60 MG 24 hr tablet Take 1 tablet (60 mg total) by mouth daily. (Patient not taking: Reported on 11/06/2021) 90 tablet 3   Vitamin D, Ergocalciferol, (DRISDOL) 1.25 MG (50000 UNIT) CAPS capsule Take 1 capsule (50,000 Units total) by mouth every 7 (seven) days. (Patient not taking: Reported on 11/06/2021) 5 capsule 3   Facility-Administered Medications Prior to Visit  Medication Dose Route Frequency Provider Last Rate Last Admin   0.9 %  sodium chloride infusion  500 mL Intravenous Continuous Armbruster, Carlota Raspberry, MD        No Known Allergies  ROS Review of Systems    Objective:    Physical Exam Constitutional:      Appearance: Teresa Collier is obese.  HENT:     Head: Normocephalic and atraumatic.  Cardiovascular:     Rate and Rhythm: Normal rate and regular rhythm.     Pulses: Normal pulses.     Heart sounds: Normal heart sounds.  Pulmonary:     Effort: Pulmonary effort is normal. No respiratory distress.     Breath sounds: No stridor. No wheezing or rhonchi.  Abdominal:     Palpations: Abdomen is soft.  Musculoskeletal:        General: Normal range of motion.      Cervical back: Normal range of motion.  Skin:    General: Skin is warm and dry.  Neurological:     General: No focal deficit present.     Mental Status: Teresa Collier is alert.  Psychiatric:        Mood and Affect: Mood normal.        Behavior: Behavior normal.        Thought Content: Thought content normal.        Judgment: Judgment normal.    BP 121/63    Pulse 81    Temp 97.9 F (36.6  C)    Ht 5' 4"  (1.626 m)    Wt 187 lb 6.4 oz (85 kg)    SpO2 92%    BMI 32.17 kg/m  Wt Readings from Last 3 Encounters:  11/06/21 187 lb 6.4 oz (85 kg)  05/05/21 205 lb (93 kg)  07/20/20 194 lb 3.2 oz (88.1 kg)     Health Maintenance Due  Topic Date Due   COVID-19 Vaccine (1) Never done   Zoster Vaccines- Shingrix (1 of 2) Never done   Pneumonia Vaccine 86+ Years old (2 - PPSV23 if available, else PCV20) 09/24/2017   MAMMOGRAM  06/08/2021    There are no preventive care reminders to display for this patient.  Lab Results  Component Value Date   TSH 2.090 11/06/2021   Lab Results  Component Value Date   WBC 9.4 07/20/2020   HGB 13.9 07/20/2020   HCT 42.2 07/20/2020   MCV 91 07/20/2020   PLT 329 07/20/2020   Lab Results  Component Value Date   NA 138 05/05/2021   K 3.7 05/05/2021   CO2 25 07/20/2020   GLUCOSE 98 05/05/2021   BUN 12 05/05/2021   CREATININE 0.94 05/05/2021   BILITOT 0.4 05/05/2021   ALKPHOS 108 05/05/2021   AST 21 05/05/2021   ALT 12 07/20/2020   PROT 7.3 05/05/2021   ALBUMIN 4.4 05/05/2021   CALCIUM 10.5 (H) 05/05/2021   ANIONGAP 8 07/12/2016   EGFR 67 05/05/2021   Lab Results  Component Value Date   CHOL 252 (H) 05/05/2021   Lab Results  Component Value Date   HDL 58 05/05/2021   Lab Results  Component Value Date   LDLCALC 151 (H) 05/05/2021   Lab Results  Component Value Date   TRIG 235 (H) 05/05/2021   Lab Results  Component Value Date   CHOLHDL 4.3 05/05/2021   Lab Results  Component Value Date   HGBA1C 5.6 05/05/2021      Assessment &  Plan:   Problem List Items Addressed This Visit       Cardiovascular and Mediastinum   Essential hypertension Stable Patient currently off nifedipine with stable blood pressure Encouraged home monitoring and recording BP <130/80 Eating a heart-healthy diet with less salt Encouraged regular physical activity  Recommend continue with weight loss efforts   Relevant Orders   Comp. Metabolic Panel (12) (Completed)     Other   Hyperlipidemia Persistent Reevaluation pending Continue with pravastatin   Relevant Orders   Lipid panel (Completed)   Shortness of breath - Primary Walking test negative for change in oxygen saturation   Relevant Orders   DG Chest 2 View (Completed)   CBC with Differential/Platelet (Completed)   Other Visit Diagnoses     Screening for thyroid disorder       Relevant Orders   TSH (Completed)       No orders of the defined types were placed in this encounter.   Follow-up: Return in about 6 months (around 05/06/2022) for Follow up HTN [id number].    Vevelyn Francois, NP

## 2021-11-06 NOTE — Patient Instructions (Signed)
Shortness of Breath, Adult °Shortness of breath means you have trouble breathing. Shortness of breath could be a sign of a medical problem. °Follow these instructions at home: °Pollution °Do not smoke or use any products that contain nicotine or tobacco. If you need help quitting, ask your doctor. °Avoid things that can make it harder to breathe, such as: °Smoke of all kinds. This includes smoke from campfires or forest fires. Do not smoke or allow others to smoke in your home. °Mold. °Dust. °Air pollution. °Chemical smells. °Things that can give you an allergic reaction (allergens) if you have allergies. °Keep your living space clean. Use products that help remove mold and dust. °General instructions °Watch for any changes in your symptoms. °Take over-the-counter and prescription medicines only as told by your doctor. This includes oxygen therapy and inhaled medicines. °Rest as needed. °Return to your normal activities when your doctor says that it is safe. °Keep all follow-up visits. °Contact a doctor if: °Your condition does not get better as soon as expected. °You have a hard time doing your normal activities, even after you rest. °You have new symptoms. °You cannot walk up stairs. °You cannot exercise the way you normally do. °Get help right away if: °Your shortness of breath gets worse. °You have trouble breathing when you are resting. °You feel light-headed or you faint. °You have a cough that is not helped by medicines. °You cough up blood. °You have pain with breathing. °You have pain in your chest, arms, shoulders, or belly (abdomen). °You have a fever. °These symptoms may be an emergency. Get help right away. Call 911. °Do not wait to see if the symptoms will go away. °Do not drive yourself to the hospital. °Summary °Shortness of breath is when you have trouble breathing enough air. It can be a sign of a medical problem. °Avoid things that make it hard for you to breathe, such as smoking, pollution, mold,  and dust. °Watch for any changes in your symptoms. Contact your doctor if you do not get better or you get worse. °This information is not intended to replace advice given to you by your health care provider. Make sure you discuss any questions you have with your health care provider. °Document Revised: 06/03/2021 Document Reviewed: 06/03/2021 °Elsevier Patient Education © 2022 [employer] Inc[employer] ° °

## 2021-11-07 ENCOUNTER — Encounter: Payer: Self-pay | Admitting: Nurse Practitioner

## 2021-11-07 LAB — COMP. METABOLIC PANEL (12)
AST: 25 IU/L (ref 0–40)
Albumin/Globulin Ratio: 1.6 (ref 1.2–2.2)
Albumin: 4.5 g/dL (ref 3.8–4.8)
Alkaline Phosphatase: 128 IU/L — ABNORMAL HIGH (ref 44–121)
BUN/Creatinine Ratio: 9 — ABNORMAL LOW (ref 12–28)
BUN: 10 mg/dL (ref 8–27)
Bilirubin Total: 0.4 mg/dL (ref 0.0–1.2)
Calcium: 10.5 mg/dL — ABNORMAL HIGH (ref 8.7–10.3)
Chloride: 99 mmol/L (ref 96–106)
Creatinine, Ser: 1.11 mg/dL — ABNORMAL HIGH (ref 0.57–1.00)
Globulin, Total: 2.8 g/dL (ref 1.5–4.5)
Glucose: 93 mg/dL (ref 70–99)
Potassium: 4.3 mmol/L (ref 3.5–5.2)
Sodium: 140 mmol/L (ref 134–144)
Total Protein: 7.3 g/dL (ref 6.0–8.5)
eGFR: 54 mL/min/{1.73_m2} — ABNORMAL LOW (ref >59)

## 2021-11-07 LAB — LIPID PANEL
Chol/HDL Ratio: 4.5 ratio — ABNORMAL HIGH (ref 0.0–4.4)
Cholesterol, Total: 239 mg/dL — ABNORMAL HIGH (ref 100–199)
HDL: 53 mg/dL (ref >39)
LDL Chol Calc (NIH): 121 mg/dL — ABNORMAL HIGH (ref 0–99)
Triglycerides: 373 mg/dL — ABNORMAL HIGH (ref 0–149)
VLDL Cholesterol Cal: 65 mg/dL — ABNORMAL HIGH (ref 5–40)

## 2021-11-07 LAB — CBC WITH DIFFERENTIAL/PLATELET
Basophils Absolute: 0 10*3/uL (ref 0.0–0.2)
Basos: 0 %
EOS (ABSOLUTE): 0.1 10*3/uL (ref 0.0–0.4)
Eos: 1 %
Hematocrit: 42.5 % (ref 34.0–46.6)
Hemoglobin: 14.4 g/dL (ref 11.1–15.9)
Immature Grans (Abs): 0 10*3/uL (ref 0.0–0.1)
Immature Granulocytes: 0 %
Lymphocytes Absolute: 3.3 10*3/uL — ABNORMAL HIGH (ref 0.7–3.1)
Lymphs: 34 %
MCH: 30.3 pg (ref 26.6–33.0)
MCHC: 33.9 g/dL (ref 31.5–35.7)
MCV: 89 fL (ref 79–97)
Monocytes Absolute: 0.8 10*3/uL (ref 0.1–0.9)
Monocytes: 8 %
Neutrophils Absolute: 5.7 10*3/uL (ref 1.4–7.0)
Neutrophils: 57 %
Platelets: 323 10*3/uL (ref 150–450)
RBC: 4.76 x10E6/uL (ref 3.77–5.28)
RDW: 12.9 % (ref 11.7–15.4)
WBC: 9.9 10*3/uL (ref 3.4–10.8)

## 2021-11-07 LAB — TSH: TSH: 2.09 u[IU]/mL (ref 0.450–4.500)

## 2022-01-08 ENCOUNTER — Encounter: Payer: Self-pay | Admitting: Gastroenterology

## 2022-01-11 ENCOUNTER — Other Ambulatory Visit: Payer: Self-pay

## 2022-02-21 ENCOUNTER — Encounter (HOSPITAL_COMMUNITY): Payer: Self-pay

## 2022-02-21 ENCOUNTER — Ambulatory Visit (INDEPENDENT_AMBULATORY_CARE_PROVIDER_SITE_OTHER): Payer: Medicare HMO

## 2022-02-21 ENCOUNTER — Ambulatory Visit (HOSPITAL_COMMUNITY)
Admission: EM | Admit: 2022-02-21 | Discharge: 2022-02-21 | Disposition: A | Discharge status: Home / Self Care | Payer: Medicare HMO | Attending: Physician Assistant | Admitting: Physician Assistant

## 2022-02-21 DIAGNOSIS — M545 Low back pain, unspecified: Secondary | ICD-10-CM

## 2022-02-21 DIAGNOSIS — N3000 Acute cystitis without hematuria: Secondary | ICD-10-CM | POA: Diagnosis not present

## 2022-02-21 DIAGNOSIS — M5442 Lumbago with sciatica, left side: Secondary | ICD-10-CM | POA: Diagnosis not present

## 2022-02-21 DIAGNOSIS — I10 Essential (primary) hypertension: Secondary | ICD-10-CM | POA: Insufficient documentation

## 2022-02-21 LAB — POCT URINALYSIS DIPSTICK, ED / UC
Glucose, UA: NEGATIVE mg/dL
Hgb urine dipstick: NEGATIVE
Leukocytes,Ua: NEGATIVE
Nitrite: POSITIVE — AB
Protein, ur: 100 mg/dL — AB
Specific Gravity, Urine: >=1.03 (ref 1.005–1.030)
Urobilinogen, UA: 1 mg/dL (ref 0.0–1.0)
pH: 5 (ref 5.0–8.0)

## 2022-02-21 MED ORDER — BACLOFEN 10 MG PO TABS: 10.0000 mg | ORAL_TABLET | Freq: Two times a day (BID) | ORAL | 0 refills | Status: DC

## 2022-02-21 MED ORDER — CEPHALEXIN 500 MG PO CAPS: 500.0000 mg | ORAL_CAPSULE | Freq: Four times a day (QID) | ORAL | 0 refills | Status: DC

## 2022-02-21 NOTE — ED Provider Notes (Signed)
?[facility] ? ? ? ?CSN: [id number] ?Arrival date & time: 02/21/22  9476 ? ? ?  ? ?History   ?Chief Complaint ?Chief Complaint  ?Patient presents with  ? Back Pain  ? ? ?HPI ?Teresa Collier is a 71 y.o. female.  ? ?Patient presents today with a 2-day history of left lower back pain.  Denies any known injury or increase in activity prior to symptom onset.  Reports pain is rated 8 on a 0-10 pain scale, described as throbbing with periodic sharp pains, no aggravating relieving factors identified.  She has tried Flexeril and meloxicam with minimal relief of symptoms.  She is also tried ibuprofen.  She denies history of previous injury or surgery involving her back.  Denies history of malignancy.  She does report occasionally having shooting pains involving her left leg but this was more intense and frequent at the onset of injury and has become less bothersome.  She denies any bowel/bladder incontinence, lower extremity weakness, saddle anesthesia. ? ? ?Past Medical History:  ?Diagnosis Date  ? Blood transfusion without reported diagnosis   ? 1973  ? Cataract   ? "beginnings"  ? Hearing loss in right ear   ? Heart murmur   ? Hyperlipidemia   ? Hypertension   ? Hypokalemia   ? Muscle spasms of lower extremity 06/2020  ? Seasonal allergies   ? Vitamin D deficiency   ? ? ?Patient Active Problem List  ? Diagnosis Date Noted  ? Shortness of breath 11/02/2019  ? Vitamin D deficiency 11/02/2019  ? Right lower quadrant abdominal pain 09/09/2019  ? Bilateral hearing loss 03/09/2019  ? Cataract 03/09/2019  ? Seasonal allergies 03/09/2019  ? Essential hypertension 10/16/2016  ? Hyperlipidemia 10/16/2016  ? Closed disp fracture of left lateral malleolus with routine healing 08/23/2016  ? Acute right-sided low back pain without sciatica 05/29/2016  ? Prediabetes 05/29/2016  ? Obesity (BMI 30-39.9) 12/05/2015  ? ? ?Past Surgical History:  ?Procedure Laterality Date  ? APPENDECTOMY    ? BUNIONECTOMY    ? COLONOSCOPY    ?  OPEN REDUCTION INTERNAL FIXATION (ORIF) FOOT LISFRANC FRACTURE Left 07/25/2016  ? Procedure: OPEN REDUCTION INTERNAL FIXATION (ORIF) FOOT LISFRANC FRACTURE;  Surgeon: Leandrew Koyanagi, MD;  Location: [facility];  Service: Orthopedics;  Laterality: Left;  ? ORIF ANKLE FRACTURE Left 07/25/2016  ? Procedure: OPEN REDUCTION INTERNAL FIXATION (ORIF) LEFT BIMALLEOLAR  ANKLE AND LISFRANC FRACTURES;  Surgeon: Leandrew Koyanagi, MD;  Location: [facility];  Service: Orthopedics;  Laterality: Left;  ? ? ?OB History   ? ? Gravida  ?6  ? Para  ?5  ? Term  ?5  ? Preterm  ?   ? AB  ?   ? Living  ?5  ?  ? ? SAB  ?   ? IAB  ?   ? Ectopic  ?   ? Multiple  ?   ? Live Births  ?   ?   ?  ?  ? ? ? ?Home Medications   ? ?Prior to Admission medications   ?Medication Sig Start Date End Date Taking? Authorizing Provider  ?baclofen (LIORESAL) 10 MG tablet Take 1 tablet (10 mg total) by mouth 2 (two) times daily. 02/21/22  Yes Masashi Snowdon, Derry Skill, PA-C  ?cephALEXin (KEFLEX) 500 MG capsule Take 1 capsule (500 mg total) by mouth 4 (four) times daily. 02/21/22  Yes Moniqua Engebretsen K, PA-C  ?albuterol (VENTOLIN HFA) 108 (90 Base) MCG/ACT inhaler  Inhale 2 puffs into the lungs every 6 (six) hours as needed for wheezing or shortness of breath. 07/20/20   Azzie Glatter, FNP  ?fluticasone (FLONASE) 50 MCG/ACT nasal spray Place 2 sprays into both nostrils daily. 07/20/20   Azzie Glatter, FNP  ?hydrochlorothiazide (HYDRODIURIL) 25 MG tablet Take 1 tablet (25 mg total) by mouth daily. 05/05/21   Vevelyn Francois, NP  ?loratadine (CLARITIN) 10 MG tablet Take 1 tablet (10 mg total) by mouth daily. 05/05/21   Vevelyn Francois, NP  ?pravastatin (PRAVACHOL) 80 MG tablet Take 1 tablet (80 mg total) by mouth daily. 05/05/21 05/05/22  Vevelyn Francois, NP  ? ? ?Family History ?Family History  ?Problem Relation Age of Onset  ? Esophageal cancer Father   ? Colon cancer Neg Hx   ? Rectal cancer Neg Hx   ? Stomach cancer Neg Hx   ? ? ?Social History ?Social  History  ? ?Tobacco Use  ? Smoking status: Former  ?  Packs/day: 2.00  ?  Years: 20.00  ?  Pack years: 40.00  ?  Types: Cigarettes  ?  Quit date: 01/27/2009  ?  Years since quitting: 13.0  ? Smokeless tobacco: Never  ?Vaping Use  ? Vaping Use: Never used  ?Substance Use Topics  ? Alcohol use: Not Currently  ?  Comment: stopped drinking 2010  ? Drug use: No  ? ? ? ?Allergies   ?Patient has no known allergies. ? ? ?Review of Systems ?Review of Systems  ?Constitutional:  Positive for activity change. Negative for appetite change, fatigue and fever.  ?Respiratory:  Negative for cough and shortness of breath.   ?Cardiovascular:  Negative for chest pain.  ?Gastrointestinal:  Negative for abdominal pain, diarrhea, nausea and vomiting.  ?Musculoskeletal:  Positive for arthralgias and back pain. Negative for myalgias.  ?Neurological:  Negative for dizziness, weakness, light-headedness, numbness and headaches.  ? ? ?Physical Exam ?Triage Vital Signs ?ED Triage Vitals  ?Enc Vitals Group  ?   BP 02/21/22 0844 (!) 150/85  ?   Pulse Rate 02/21/22 0844 83  ?   Resp 02/21/22 0844 18  ?   Temp 02/21/22 0844 98.2 ?F (36.8 ?C)  ?   Temp Source 02/21/22 0844 Oral  ?   SpO2 02/21/22 0844 94 %  ?   Weight --   ?   Height --   ?   Head Circumference --   ?   Peak Flow --   ?   Pain Score 02/21/22 0845 8  ?   Pain Loc --   ?   Pain Edu? --   ?   Excl. in [place]? --   ? ?No data found. ? ?Updated Vital Signs ?BP 117/76 (BP Location: Right Arm)   Pulse 82   Temp 98.2 ?F (36.8 ?C) (Oral)   Resp 18   SpO2 96%  ? ?Visual Acuity ?Right Eye Distance:   ?Left Eye Distance:   ?Bilateral Distance:   ? ?Right Eye Near:   ?Left Eye Near:    ?Bilateral Near:    ? ?Physical Exam ?Vitals reviewed.  ?Constitutional:   ?   General: She is awake. She is not in acute distress. ?   Appearance: Normal appearance. She is well-developed. She is not ill-appearing.  ?   Comments: Very pleasant female appears stated age in no acute distress sitting comfortably in  exam room  ?HENT:  ?   Head: Normocephalic and atraumatic.  ?Cardiovascular:  ?  Rate and Rhythm: Normal rate and regular rhythm.  ?   Heart sounds: Normal heart sounds, S1 normal and S2 normal. No murmur heard. ?Pulmonary:  ?   Effort: Pulmonary effort is normal.  ?   Breath sounds: Normal breath sounds. No wheezing, rhonchi or rales.  ?   Comments: Clear to auscultation bilaterally ?Abdominal:  ?   Palpations: Abdomen is soft.  ?   Tenderness: There is no abdominal tenderness.  ?Musculoskeletal:  ?   Cervical back: No tenderness or bony tenderness.  ?   Thoracic back: No tenderness or bony tenderness.  ?   Lumbar back: Spasms, tenderness and bony tenderness present. Negative right straight leg raise test and negative left straight leg raise test.  ?   Comments: Significant pain with palpation of her L4 and L5.  No deformity or step-off noted.  Tenderness palpation with associated spasm noted left lumbar paraspinal muscles.  Strength 5/5 bilateral lower extremity  ?Psychiatric:     ?   Behavior: Behavior is cooperative.  ? ? ? ?UC Treatments / Results  ?Labs ?(all labs ordered are listed, but only abnormal results are displayed) ?Labs Reviewed  ?POCT URINALYSIS DIPSTICK, ED / UC - Abnormal; Notable for the following components:  ?    Result Value  ? Bilirubin Urine MODERATE (*)   ? Ketones, ur TRACE (*)   ? Protein, ur 100 (*)   ? Nitrite POSITIVE (*)   ? All other components within normal limits  ?URINE CULTURE  ? ? ?EKG ? ? ?Radiology ?DG Lumbar Spine Complete ? ?Result Date: 02/21/2022 ?CLINICAL DATA:  Pain for 3 days, no reported injury. EXAM: LUMBAR SPINE - COMPLETE 4+ VIEW COMPARISON:  May 11, 2004. FINDINGS: Five lumbar type vertebral bodies. Mild disc space narrowing and anterior osteophyte formation. Alignment is normal. Vertebral body heights are normal. Facet arthropathy present at L3-4, L4-5 and L5-S1 is moderate. No signs of acute abnormality. IMPRESSION: Degenerative disc disease and facet  arthropathy. Electronically Signed   By: Zetta Bills M.D.   On: 02/21/2022 09:35   ? ?Procedures ?Procedures (including critical care time) ? ?Medications Ordered in UC ?Medications - No data to display ? ?Initial Impress

## 2022-02-21 NOTE — ED Triage Notes (Signed)
Pt states left lower back pain radiating down l leg for the past 2 days.  Denies injury. Took Flexeril and Meloxicam with no relief.  Ibuprofen with relief. ?

## 2022-02-21 NOTE — Discharge Instructions (Addendum)
Your x-ray showed some arthritis but otherwise was normal.  Take baclofen up to twice a day.  This to make you sleepy so do not drive or drink alcohol while taking it.  Use Tylenol for pain.  I also recommend heat and gentle stretch.  If symptoms or not improving quickly please follow-up with sports medicine; call to schedule an appointment with them. ? ?Your urine did show evidence of a urinary tract infection.  This could be contributing to your back pain.  Please start Keflex (cephalexin) to 4 times a day.  Make sure you are drinking plenty of fluid.  If you have any worsening symptoms you need to return for reevaluation. ? ?Your blood pressure is elevated.  Please take your medicine when you get home.  Monitor this at home.  Avoid NSAIDs (aspirin, ibuprofen/Advil, naproxen/Aleve), caffeine, sodium, decongestants.  If you develop any chest pain, shortness of breath, headache, vision change in the setting of high blood pressure you need to go to the emergency room.  Follow-up with your primary care next week for blood pressure recheck. ?

## 2022-02-22 LAB — URINE CULTURE

## 2022-02-23 ENCOUNTER — Telehealth: Payer: Medicare HMO | Admitting: Emergency Medicine

## 2022-02-23 DIAGNOSIS — M545 Low back pain, unspecified: Secondary | ICD-10-CM

## 2022-02-23 MED ORDER — LIDOCAINE 5 % EX PTCH: 1.0000 | MEDICATED_PATCH | CUTANEOUS | 0 refills | Status: DC

## 2022-02-23 NOTE — Patient Instructions (Signed)
Please continue taking medications as prescribed at urgent care.  If not better by Monday, make an appointment with your doctor or with an orthopedic doctor.  You may need to have further workup done or have physical therapy. ?

## 2022-02-23 NOTE — Progress Notes (Signed)
?Virtual Visit Consent  ? ?Teresa Collier, you are scheduled for a virtual visit with a [facility] provider today.   ?  ?Just as with appointments in the office, your consent must be obtained to participate.  Your consent will be active for this visit and any virtual visit you may have with one of our providers in the next 365 days.   ?  ?If you have a MyChart account, a copy of this consent can be sent to you electronically.  All virtual visits are billed to your insurance company just like a traditional visit in the office.   ? ?As this is a virtual visit, video technology does not allow for your provider to perform a traditional examination.  This may limit your provider's ability to fully assess your condition.  If your provider identifies any concerns that need to be evaluated in person or the need to arrange testing (such as labs, EKG, etc.), we will make arrangements to do so.   ?  ?Although advances in technology are sophisticated, we cannot ensure that it will always work on either your end or our end.  If the connection with a video visit is poor, the visit may have to be switched to a telephone visit.  With either a video or telephone visit, we are not always able to ensure that we have a secure connection.    ? ?Also, by engaging in this virtual visit, you consent to the provision of healthcare. Additionally, you authorize for your insurance to be billed (if applicable) for the services provided during this visit.  ? ?I need to obtain your verbal consent now.   Are you willing to proceed with your visit today?  ?  ?Teresa Collier has provided verbal consent on 02/23/2022 for a virtual visit (video or telephone). ?  ?Montine Circle, PA-C  ? ?Date: 02/23/2022 9:45 AM ? ? ?Virtual Visit via Video Note  ? Teresa Collier, connected with  Teresa Collier  ([id number], December 26, 1950) on 02/23/22 at  9:45 AM EDT by a video-enabled telemedicine application and verified that I am speaking with the correct  person using two identifiers. ? ?Location: ?Patient: Virtual Visit Location Patient: Home ?Provider: Virtual Visit Location Provider: Home Office ?  ?I discussed the limitations of evaluation and management by telemedicine and the availability of in person appointments. The patient expressed understanding and agreed to proceed.   ? ?History of Present Illness: ?Teresa Collier is a 71 y.o. who identifies as a female who was assigned female at birth, and is being seen today for lower back pain.  Was seen at urgent care and told that she had a UTI.  States that she is still having low back pain.  States that she has been on Baclofen and Keflex.  Denies any fever.  States that she is able to get up and move around.  States that Tylenol. States that the pain worsens with movement.  Denies incontinence, weakness, or numbness. ? ?HPI: HPI  ?Problems:  ?Patient Active Problem List  ? Diagnosis Date Noted  ? Shortness of breath 11/02/2019  ? Vitamin D deficiency 11/02/2019  ? Right lower quadrant abdominal pain 09/09/2019  ? Bilateral hearing loss 03/09/2019  ? Cataract 03/09/2019  ? Seasonal allergies 03/09/2019  ? Essential hypertension 10/16/2016  ? Hyperlipidemia 10/16/2016  ? Closed disp fracture of left lateral malleolus with routine healing 08/23/2016  ? Acute right-sided low back pain without sciatica 05/29/2016  ? Prediabetes 05/29/2016  ? Obesity (BMI 30-39.9)  12/05/2015  ?  ?Allergies: No Known Allergies ?Medications:  ?Current Outpatient Medications:  ?  albuterol (VENTOLIN HFA) 108 (90 Base) MCG/ACT inhaler, Inhale 2 puffs into the lungs every 6 (six) hours as needed for wheezing or shortness of breath., Disp: 8 g, Rfl: 12 ?  baclofen (LIORESAL) 10 MG tablet, Take 1 tablet (10 mg total) by mouth 2 (two) times daily., Disp: 30 each, Rfl: 0 ?  cephALEXin (KEFLEX) 500 MG capsule, Take 1 capsule (500 mg total) by mouth 4 (four) times daily., Disp: 20 capsule, Rfl: 0 ?  fluticasone (FLONASE) 50 MCG/ACT nasal spray,  Place 2 sprays into both nostrils daily., Disp: 16 g, Rfl: 12 ?  hydrochlorothiazide (HYDRODIURIL) 25 MG tablet, Take 1 tablet (25 mg total) by mouth daily., Disp: 90 tablet, Rfl: 3 ?  loratadine (CLARITIN) 10 MG tablet, Take 1 tablet (10 mg total) by mouth daily., Disp: 90 tablet, Rfl: 3 ?  pravastatin (PRAVACHOL) 80 MG tablet, Take 1 tablet (80 mg total) by mouth daily., Disp: 90 tablet, Rfl: 3 ? ?Current Facility-Administered Medications:  ?  0.9 %  sodium chloride infusion, 500 mL, Intravenous, Continuous, Armbruster, Carlota Raspberry, MD ? ?Observations/Objective: ?Patient is well-developed, well-nourished in no acute distress.  ?Resting comfortably at home.  ?Head is normocephalic, atraumatic.  ?No labored breathing.  ?Speech is clear and coherent with logical content.  ?Patient is alert and oriented at baseline.  ? ? ?Assessment and Plan: ?1. Acute low back pain, unspecified back pain laterality, unspecified whether sciatica present ? ?- Add lidoderm patches ?- Consider Sports Medicine follow-up on Monday if not improving ?- Continue treatment Rxd at [facility]. ? ?Follow Up Instructions: ?I discussed the assessment and treatment plan with the patient. The patient was provided an opportunity to ask questions and all were answered. The patient agreed with the plan and demonstrated an understanding of the instructions.  A copy of instructions were sent to the patient via MyChart unless otherwise noted below.  ? ? ? ?The patient was advised to call back or seek an in-person evaluation if the symptoms worsen or if the condition fails to improve as anticipated. ? ?Time:  ?I spent 12 minutes with the patient via telehealth technology discussing the above problems/concerns.   ? ?Montine Circle, PA-C ? ?

## 2022-02-24 ENCOUNTER — Telehealth: Payer: Medicare HMO | Admitting: Physician Assistant

## 2022-02-24 ENCOUNTER — Other Ambulatory Visit: Payer: Self-pay | Admitting: Physician Assistant

## 2022-02-24 ENCOUNTER — Encounter: Payer: Self-pay | Admitting: Physician Assistant

## 2022-02-24 DIAGNOSIS — M545 Low back pain, unspecified: Secondary | ICD-10-CM

## 2022-02-24 MED ORDER — LIDOCAINE 5 % EX PTCH: 1.0000 | MEDICATED_PATCH | CUTANEOUS | 0 refills | Status: DC

## 2022-02-24 NOTE — Progress Notes (Signed)
Patient unable obtain med prescribed during video visit 02/23/22.  Resent on patients behalf.  ? ?Kennieth Rad, PA-C ?Physician Assistant ?

## 2022-02-26 ENCOUNTER — Encounter: Payer: Self-pay | Admitting: Physician Assistant

## 2022-02-28 ENCOUNTER — Encounter: Payer: Self-pay | Admitting: Physician Assistant

## 2022-02-28 ENCOUNTER — Ambulatory Visit (INDEPENDENT_AMBULATORY_CARE_PROVIDER_SITE_OTHER): Payer: Medicare HMO | Admitting: Physician Assistant

## 2022-02-28 VITALS — BP 134/76 | HR 87 | Temp 98.9°F | Resp 18 | Ht 63.5 in | Wt 186.0 lb

## 2022-02-28 DIAGNOSIS — N3000 Acute cystitis without hematuria: Secondary | ICD-10-CM

## 2022-02-28 DIAGNOSIS — E6609 Other obesity due to excess calories: Secondary | ICD-10-CM

## 2022-02-28 DIAGNOSIS — Z6832 Body mass index (BMI) 32.0-32.9, adult: Secondary | ICD-10-CM

## 2022-02-28 DIAGNOSIS — M5441 Lumbago with sciatica, right side: Secondary | ICD-10-CM | POA: Diagnosis not present

## 2022-02-28 MED ORDER — CYCLOBENZAPRINE HCL 5 MG PO TABS: 5.0000 mg | ORAL_TABLET | Freq: Three times a day (TID) | ORAL | 1 refills | Status: DC | PRN

## 2022-02-28 MED ORDER — KETOROLAC TROMETHAMINE 60 MG/2ML IM SOLN
60.0000 mg | Freq: Once | INTRAMUSCULAR | Status: AC
  Administered 2022-02-28: 60 mg via INTRAMUSCULAR

## 2022-02-28 MED ORDER — METHYLPREDNISOLONE ACETATE 80 MG/ML IJ SUSP
80.0000 mg | Freq: Once | INTRAMUSCULAR | Status: AC
  Administered 2022-02-28: 80 mg via INTRAMUSCULAR

## 2022-02-28 NOTE — Patient Instructions (Signed)
I do encourage you to continue using ibuprofen as needed, and I changed the muscle relaxer to one that  hopefully will make you sleepy. ? ?I encourage you to work on a good stretching program, make sure you are drinking lots of water and resume taking your vitamin D. ? ?We will call you with the results of your urine culture. ? ?Kennieth Rad, PA-C ?Physician Assistant ?[place] ?[email or web address] ? ? ?Sciatica ? ?Sciatica is pain, numbness, weakness, or tingling along the path of the sciatic nerve. The sciatic nerve starts in the lower back and runs down the back of each leg. The nerve controls the muscles in the lower leg and in the back of the knee. It also provides feeling (sensation) to the back of the thigh, the lower leg, and the sole of the foot. Sciatica is a symptom of another medical condition that pinches or puts pressure on the sciatic nerve. ?Sciatica most often only affects one side of the body. Sciatica usually goes away on its own or with treatment. In some cases, sciatica may come back (recur). ?What are the causes? ?This condition is caused by pressure on the sciatic nerve or pinching of the nerve. This may be the result of: ?A disk in between the bones of the spine bulging out too far (herniated disk). ?Age-related changes in the spinal disks. ?A pain disorder that affects a muscle in the buttock. ?Extra bone growth near the sciatic nerve. ?A break (fracture) of the pelvis. ?Pregnancy. ?Tumor. This is rare. ?What increases the risk? ?The following factors may make you more likely to develop this condition: ?Playing sports that place pressure or stress on the spine. ?Having poor strength and flexibility. ?A history of back injury or surgery. ?Sitting for long periods of time. ?Doing activities that involve repetitive bending or lifting. ?Obesity. ?What are the signs or symptoms? ?Symptoms can vary from mild to very severe, and they may  include: ?Any of these problems in the lower back, leg, hip, or buttock: ?Mild tingling, numbness, or dull aches. ?Burning sensations. ?Sharp pains. ?Numbness in the back of the calf or the sole of the foot. ?Leg weakness. ?Severe back pain that makes movement difficult. ?Symptoms may get worse when you cough, sneeze, or laugh, or when you sit or stand for long periods of time. ?How is this diagnosed? ?This condition may be diagnosed based on: ?Your symptoms and medical history. ?A physical exam. ?Blood tests. ?Imaging tests, such as: ?X-rays. ?MRI. ?CT scan. ?How is this treated? ?In many cases, this condition improves on its own without treatment. However, treatment may include: ?Reducing or modifying physical activity. ?Exercising and stretching. ?Icing and applying heat to the affected area. ?Medicines that help to: ?Relieve pain and swelling. ?Relax your muscles. ?Injections of medicines that help to relieve pain, irritation, and inflammation around the sciatic nerve (steroids). ?Surgery. ?Follow these instructions at home: ?Medicines ?Take over-the-counter and prescription medicines only as told by your health care provider. ?Ask your health care provider if the medicine prescribed to you: ?Requires you to avoid driving or using heavy machinery. ?Can cause constipation. You may need to take these actions to prevent or treat constipation: ?Drink enough fluid to keep your urine pale yellow. ?Take over-the-counter or prescription medicines. ?Eat foods that are high in fiber, such as beans, whole grains, and fresh fruits and vegetables. ?Limit foods that are high in fat and processed sugars, such as fried or sweet foods. ?Managing pain ? ?  ? ?If  directed, put ice on the affected area. ?Put ice in a plastic bag. ?Place a towel between your skin and the bag. ?Leave the ice on for 20 minutes, 2-3 times a day. ?If directed, apply heat to the affected area. Use the heat source that your health care provider  recommends, such as a moist heat pack or a heating pad. ?Place a towel between your skin and the heat source. ?Leave the heat on for 20-30 minutes. ?Remove the heat if your skin turns bright red. This is especially important if you are unable to feel pain, heat, or cold. You may have a greater risk of getting burned. ?Activity ? ?Return to your normal activities as told by your health care provider. Ask your health care provider what activities are safe for you. ?Avoid activities that make your symptoms worse. ?Take brief periods of rest throughout the day. ?When you rest for longer periods, mix in some mild activity or stretching between periods of rest. This will help to prevent stiffness and pain. ?Avoid sitting for long periods of time without moving. Get up and move around at least one time each hour. ?Exercise and stretch regularly, as told by your health care provider. ?Do not lift anything that is heavier than 10 lb (4.5 kg) while you have symptoms of sciatica. When you do not have symptoms, you should still avoid heavy lifting, especially repetitive heavy lifting. ?When you lift objects, always use proper lifting technique, which includes: ?Bending your knees. ?Keeping the load close to your body. ?Avoiding twisting. ?General instructions ?Maintain a healthy weight. Excess weight puts extra stress on your back. ?Wear supportive, comfortable shoes. Avoid wearing high heels. ?Avoid sleeping on a mattress that is too soft or too hard. A mattress that is firm enough to support your back when you sleep may help to reduce your pain. ?Keep all follow-up visits as told by your health care provider. This is important. ?Contact a health care provider if: ?You have pain that: ?Wakes you up when you are sleeping. ?Gets worse when you lie down. ?Is worse than you have experienced in the past. ?Lasts longer than 4 weeks. ?You have an unexplained weight loss. ?Get help right away if: ?You are not able to control when you  urinate or have bowel movements (incontinence). ?You have: ?Weakness in your lower back, pelvis, buttocks, or legs that gets worse. ?Redness or swelling of your back. ?A burning sensation when you urinate. ?Summary ?Sciatica is pain, numbness, weakness, or tingling along the path of the sciatic nerve. ?This condition is caused by pressure on the sciatic nerve or pinching of the nerve. ?Sciatica can cause pain, numbness, or tingling in the lower back, legs, hips, and buttocks. ?Treatment often includes rest, exercise, medicines, and applying ice or heat. ?This information is not intended to replace advice given to you by your health care provider. Make sure you discuss any questions you have with your health care provider. ?Document Revised: 11/03/2018 Document Reviewed: 11/03/2018 ?Elsevier Patient Education ? [place]. ? ?

## 2022-02-28 NOTE — Progress Notes (Signed)
? ?Established Patient Office Visit ? ?Subjective   ?Patient ID: Teresa Collier, female    DOB: 1951-06-13  Age: 71 y.o. MRN: [id number] ? ?Chief Complaint  ?Patient presents with  ? Back Pain  ? ? ?States that she was seen in the emergency department on February 21, 2022.  Note from that visit: ? ?HPI ?Teresa Collier is a 71 y.o. female.  ?  ?Patient presents today with a 2-day history of left lower back pain.  Denies any known injury or increase in activity prior to symptom onset.  Reports pain is rated 8 on a 0-10 pain scale, described as throbbing with periodic sharp pains, no aggravating relieving factors identified.  She has tried Flexeril and meloxicam with minimal relief of symptoms.  She is also tried ibuprofen.  She denies history of previous injury or surgery involving her back.  Denies history of malignancy.  She does report occasionally having shooting pains involving her left leg but this was more intense and frequent at the onset of injury and has become less bothersome.  She denies any bowel/bladder incontinence, lower extremity weakness, saddle anesthesia. ?  ?Suspect that back pain is multifactorial.  X-ray showed degenerative changes likely contributing to symptoms.  UA was obtained which showed positive nitrates so we will treat empirically for UTI.  Patient was started on Keflex 500 mg 4 times daily for 5 days.  Urine culture was obtained and we will contact her if we need to change antibiotic based on culture result.  No indication for dose adjustment based on CMP from 11/06/2021 with creatinine of 1.11 and calculated creatinine clearance of 64.95 mL/min.  Discussed that given elevated blood pressure she should avoid NSAIDs (see below).  We will transition to baclofen for symptom relief with instruction not to drive or drink alcohol with taking this medication.  Recommended conservative treatment measures including heat, rest, stretch.  Discussed that if symptoms or not improving she should  follow-up with sports medicine was given contact information for local provider with instruction to call to schedule appointment.  Discussed that if she has any worsening symptoms including fever, worsening pain, weakness, numbness, paresthesias, flank pain, nausea/vomiting she needs to be seen immediately.  Strict return precautions given to which she expressed understanding. ?  ?Blood pressure was elevated today.  Patient has not taken her blood pressure medication today.  She denies any signs/symptoms of endorgan damage.  Recommended she avoid NSAIDs, decongestants, sodium, caffeine.  She is to go home and take her medication and monitor her blood pressure at home.  If she develops any chest pain, shortness of breath, headache, vision change, dizziness in the setting of high blood pressure she needs to go to the emergency room to which she expressed understanding.  Recommended follow-up with PCP within a week for blood pressure recheck. ? ?States that she then had a virtual Visit with Telehealth on 02/23/22 ? ?History of Present Illness: ?Teresa Collier is a 71 y.o. who identifies as a female who was assigned female at birth, and is being seen today for lower back pain.  Was seen at urgent care and told that she had a UTI.  States that she is still having low back pain.  States that she has been on Baclofen and Keflex.  Denies any fever.  States that she is able to get up and move around.  States that Tylenol. States that the pain worsens with movement.  Denies incontinence, weakness, or numbness. ? ?Assessment and Plan: ?1. Acute low  back pain, unspecified back pain laterality, unspecified whether sciatica present ?  ?- Add lidoderm patches ?- Consider Sports Medicine follow-up on Monday if not improving ?- Continue treatment R[facility] at [facility] ? ?States today that she was unable to get the Lidoderm patches due to her insurance denying pain for it.  States that she continues to have the low back pain, worse on the  right side, does endorse that the pain travels down her right leg.  States that she has been using the baclofen, but unfortunately it makes her sleepy and can only take it at night.  States that she has been using ibuprofen with a small amount of relief.  Denies numbness or tingling. ? ?States that she did finish the prescription for Keflex.  States that she has not had any dysuria, but does endorse that she did not have symptoms of a urinary tract infection with the exception of the low back pain prior to her starting the Keflex. ? ?  ? ?Past Medical History:  ?Diagnosis Date  ? Blood transfusion without reported diagnosis   ? 1973  ? Cataract   ? "beginnings"  ? Hearing loss in right ear   ? Heart murmur   ? Hyperlipidemia   ? Hypertension   ? Hypokalemia   ? Muscle spasms of lower extremity 06/2020  ? Seasonal allergies   ? Vitamin D deficiency   ? ?Social History  ? ?Socioeconomic History  ? Marital status: Married  ?  Spouse name: Not on file  ? Number of children: Not on file  ? Years of education: Not on file  ? Highest education level: Not on file  ?Occupational History  ? Not on file  ?Tobacco Use  ? Smoking status: Former  ?  Packs/day: 2.00  ?  Years: 20.00  ?  Pack years: 40.00  ?  Types: Cigarettes  ?  Quit date: 01/27/2009  ?  Years since quitting: 13.0  ? Smokeless tobacco: Never  ?Vaping Use  ? Vaping Use: Never used  ?Substance and Sexual Activity  ? Alcohol use: Not Currently  ?  Comment: stopped drinking 2010  ? Drug use: No  ? Sexual activity: Yes  ?  Partners: Male  ?Other Topics Concern  ? Not on file  ?Social History Narrative  ? Not on file  ? ?Social Determinants of Health  ? ?Financial Resource Strain: Not on file  ?Food Insecurity: Not on file  ?Transportation Needs: Not on file  ?Physical Activity: Not on file  ?Stress: Not on file  ?Social Connections: Not on file  ?Intimate Partner Violence: Not on file  ? ?Family History  ?Problem Relation Age of Onset  ? Esophageal cancer Father   ?  Colon cancer Neg Hx   ? Rectal cancer Neg Hx   ? Stomach cancer Neg Hx   ? ?No Known Allergies ?  ? ?Review of Systems  ?Constitutional:  Negative for chills and fever.  ?HENT: Negative.    ?Eyes: Negative.   ?Respiratory:  Negative for shortness of breath.   ?Cardiovascular:  Negative for chest pain.  ?Gastrointestinal:  Negative for abdominal pain, nausea and vomiting.  ?Genitourinary:  Negative for dysuria, frequency, hematuria and urgency.  ?Musculoskeletal:  Positive for back pain.  ?Skin: Negative.   ?Neurological: Negative.   ?Endo/Heme/Allergies: Negative.   ?Psychiatric/Behavioral: Negative.    ? ?  ?Objective:  ?  ? ?BP 134/76 (BP Location: Right Arm, Patient Position: Sitting, Cuff Size: Large)   Pulse 87  Temp 98.9 ?F (37.2 ?C) (Oral)   Resp 18   Ht 5' 3.5" (1.613 m)   Wt 186 lb (84.4 kg)   SpO2 94%   BMI 32.43 kg/m?  ? ? ?Physical Exam ?Vitals and nursing note reviewed.  ?Constitutional:   ?   Appearance: Normal appearance.  ?HENT:  ?   Head: Normocephalic and atraumatic.  ?   Right Ear: External ear normal.  ?   Left Ear: External ear normal.  ?   Nose: Nose normal.  ?   Mouth/Throat:  ?   Mouth: Mucous membranes are moist.  ?   Pharynx: Oropharynx is clear.  ?Eyes:  ?   Extraocular Movements: Extraocular movements intact.  ?   Conjunctiva/sclera: Conjunctivae normal.  ?   Pupils: Pupils are equal, round, and reactive to light.  ?Cardiovascular:  ?   Rate and Rhythm: Normal rate.  ?   Pulses: Normal pulses.  ?Pulmonary:  ?   Effort: Pulmonary effort is normal.  ?   Breath sounds: Normal breath sounds.  ?Abdominal:  ?   Tenderness: There is no abdominal tenderness. There is no right CVA tenderness or left CVA tenderness.  ?Musculoskeletal:  ?   Cervical back: Normal, normal range of motion and neck supple.  ?   Thoracic back: Normal.  ?   Lumbar back: Tenderness present. Decreased range of motion.  ?   Comments: Pain elicited with range of motion testing.  ?Skin: ?   General: Skin is warm and  dry.  ?Neurological:  ?   General: No focal deficit present.  ?   Mental Status: She is alert and oriented to person, place, and time.  ?Psychiatric:     ?   Mood and Affect: Mood normal.     ?   Behavior: Behavior norma

## 2022-03-03 LAB — URINE CULTURE

## 2022-03-07 NOTE — Telephone Encounter (Signed)
-----   Message from Kennieth Rad, PA-C sent at 03/05/2022  9:37 AM EDT ----- ?Please call patient and let her know that her urine culture did not show any bacteria, she had completed a course of Keflex so this may have cleared the infection.  No further testing required at this time ?

## 2022-05-03 DIAGNOSIS — H04123 Dry eye syndrome of bilateral lacrimal glands: Secondary | ICD-10-CM | POA: Diagnosis not present

## 2022-05-03 DIAGNOSIS — H5213 Myopia, bilateral: Secondary | ICD-10-CM | POA: Diagnosis not present

## 2022-05-07 ENCOUNTER — Ambulatory Visit: Payer: Medicare HMO | Admitting: Nurse Practitioner

## 2022-05-22 ENCOUNTER — Other Ambulatory Visit: Payer: Self-pay

## 2022-05-22 DIAGNOSIS — I10 Essential (primary) hypertension: Secondary | ICD-10-CM

## 2022-05-23 MED ORDER — HYDROCHLOROTHIAZIDE 25 MG PO TABS: 25.0000 mg | ORAL_TABLET | Freq: Every day | ORAL | 3 refills | Status: DC

## 2022-05-30 ENCOUNTER — Encounter: Payer: Self-pay | Admitting: Nurse Practitioner

## 2022-05-30 ENCOUNTER — Ambulatory Visit (INDEPENDENT_AMBULATORY_CARE_PROVIDER_SITE_OTHER): Payer: Medicare HMO | Admitting: Nurse Practitioner

## 2022-05-30 VITALS — BP 134/81 | HR 82 | Temp 97.5°F | Ht 63.5 in | Wt 180.2 lb

## 2022-05-30 DIAGNOSIS — Z1231 Encounter for screening mammogram for malignant neoplasm of breast: Secondary | ICD-10-CM

## 2022-05-30 DIAGNOSIS — Z0000 Encounter for general adult medical examination without abnormal findings: Secondary | ICD-10-CM

## 2022-05-30 DIAGNOSIS — M25531 Pain in right wrist: Secondary | ICD-10-CM | POA: Diagnosis not present

## 2022-05-30 DIAGNOSIS — M779 Enthesopathy, unspecified: Secondary | ICD-10-CM

## 2022-05-30 MED ORDER — PREDNISONE 20 MG PO TABS: 20.0000 mg | ORAL_TABLET | Freq: Every day | ORAL | 0 refills | Status: AC

## 2022-05-30 NOTE — Assessment & Plan Note (Signed)
-   CBC - Comprehensive metabolic panel  2. Right wrist pain  - predniSONE (DELTASONE) 20 MG tablet; Take 1 tablet (20 mg total) by mouth daily with breakfast for 5 days.  Dispense: 5 tablet; Refill: 0  3. Tendonitis  - predniSONE (DELTASONE) 20 MG tablet; Take 1 tablet (20 mg total) by mouth daily with breakfast for 5 days.  Dispense: 5 tablet; Refill: 0   4. Encounter for screening mammogram for malignant neoplasm of breast  - MM Digital Screening; Future   Follow up:  Follow up in 6 months

## 2022-05-30 NOTE — Progress Notes (Signed)
$'@Patient'B$  ID: Teresa Collier, female    DOB: 1951/06/29, 71 y.o.   MRN: [id number]  Chief Complaint  Patient presents with   Follow-up    Patient presents for a follow up visit. Complains of wrist pain.     Referring provider: No ref. provider found   HPI  Teresa Collier presents for follow up. She  has a past medical history of Blood transfusion without reported diagnosis, Cataract, Hearing loss in right ear, Heart murmur, Hyperlipidemia, Hypertension, Hypokalemia, Muscle spasms of lower extremity (06/2020), Seasonal allergies, and Vitamin D deficiency.   Patient presents today for follow-up visit.  Overall she states that things have been going well other than recent right wrist pain.  She states that this started this past Sunday.  She has had issues with this in the past and states that steroids have helped.  She does have decreased range of motion to her right wrist. Denies f/c/s, n/v/d, hemoptysis, PND, leg swelling Denies chest pain or edema      No Known Allergies  Immunization History  Administered Date(s) Administered   Influenza,inj,Quad PF,6+ Mos 08/27/2016, 12/11/2017, 09/08/2018, 09/09/2019, 07/20/2020   Influenza,inj,quad, With Preservative 10/21/2013   Influenza-Unspecified 10/21/2013, 09/04/2021   Pneumococcal Conjugate-13 09/24/2016   Tdap 10/30/2007, 09/24/2016    Past Medical History:  Diagnosis Date   Blood transfusion without reported diagnosis    1973   Cataract    "beginnings"   Hearing loss in right ear    Heart murmur    Hyperlipidemia    Hypertension    Hypokalemia    Muscle spasms of lower extremity 06/2020   Seasonal allergies    Vitamin D deficiency     Tobacco History: Social History   Tobacco Use  Smoking Status Former   Packs/day: 2.00   Years: 20.00   Total pack years: 40.00   Types: Cigarettes   Quit date: 01/27/2009   Years since quitting: 13.3  Smokeless Tobacco Never   Counseling given: Not Answered   Outpatient  Encounter Medications as of 05/30/2022  Medication Sig   albuterol (VENTOLIN HFA) 108 (90 Base) MCG/ACT inhaler Inhale 2 puffs into the lungs every 6 (six) hours as needed for wheezing or shortness of breath.   baclofen (LIORESAL) 10 MG tablet Take 1 tablet (10 mg total) by mouth 2 (two) times daily.   cyclobenzaprine (FLEXERIL) 5 MG tablet Take 1 tablet (5 mg total) by mouth 3 (three) times daily as needed for muscle spasms.   fluticasone (FLONASE) 50 MCG/ACT nasal spray Place 2 sprays into both nostrils daily.   hydrochlorothiazide (HYDRODIURIL) 25 MG tablet Take 1 tablet (25 mg total) by mouth daily.   loratadine (CLARITIN) 10 MG tablet Take 1 tablet (10 mg total) by mouth daily.   Multiple Vitamin (MULTIVITAMIN) tablet Take 1 tablet by mouth daily.   predniSONE (DELTASONE) 20 MG tablet Take 1 tablet (20 mg total) by mouth daily with breakfast for 5 days.   cephALEXin (KEFLEX) 500 MG capsule Take 1 capsule (500 mg total) by mouth 4 (four) times daily.   lidocaine (LIDODERM) 5 % Place 1 patch onto the skin daily. Remove & Discard patch within 12 hours or as directed by MD (Patient not taking: Reported on 05/30/2022)   pravastatin (PRAVACHOL) 80 MG tablet Take 1 tablet (80 mg total) by mouth daily.   Facility-Administered Encounter Medications as of 05/30/2022  Medication   0.9 %  sodium chloride infusion     Review of Systems  Review of Systems  Constitutional:  Negative.   HENT: Negative.    Cardiovascular: Negative.   Gastrointestinal: Negative.   Musculoskeletal:        Right wrist pain  Allergic/Immunologic: Negative.   Neurological: Negative.   Psychiatric/Behavioral: Negative.         Physical Exam  BP 134/81 (BP Location: Right Arm, Patient Position: Sitting, Cuff Size: Normal)   Pulse 82   Temp (!) 97.5 F (36.4 C)   Ht 5' 3.5" (1.613 m)   Wt 180 lb 3.2 oz (81.7 kg)   SpO2 96%   BMI 31.42 kg/m   Wt Readings from Last 5 Encounters:  05/30/22 180 lb 3.2 oz (81.7 kg)   02/28/22 186 lb (84.4 kg)  11/06/21 187 lb 6.4 oz (85 kg)  05/05/21 205 lb (93 kg)  07/20/20 194 lb 3.2 oz (88.1 kg)     Physical Exam Vitals and nursing note reviewed.  Constitutional:      General: She is not in acute distress.    Appearance: She is well-developed.  Cardiovascular:     Rate and Rhythm: Normal rate and regular rhythm.  Pulmonary:     Effort: Pulmonary effort is normal.     Breath sounds: Normal breath sounds.  Musculoskeletal:     Right wrist: Tenderness present. Decreased range of motion.  Neurological:     Mental Status: She is alert and oriented to person, place, and time.      Lab Results:  CBC    Component Value Date/Time   WBC 9.9 11/06/2021 1227   WBC 8.5 04/08/2017 0858   RBC 4.76 11/06/2021 1227   RBC 4.46 04/08/2017 0858   HGB 14.4 11/06/2021 1227   HCT 42.5 11/06/2021 1227   PLT 323 11/06/2021 1227   MCV 89 11/06/2021 1227   MCH 30.3 11/06/2021 1227   MCH 30.0 04/08/2017 0858   MCHC 33.9 11/06/2021 1227   MCHC 32.6 04/08/2017 0858   RDW 12.9 11/06/2021 1227   LYMPHSABS 3.3 (H) 11/06/2021 1227   MONOABS 510 04/08/2017 0858   EOSABS 0.1 11/06/2021 1227   BASOSABS 0.0 11/06/2021 1227    BMET    Component Value Date/Time   NA 140 11/06/2021 1227   K 4.3 11/06/2021 1227   CL 99 11/06/2021 1227   CO2 25 07/20/2020 0941   GLUCOSE 93 11/06/2021 1227   GLUCOSE 94 04/08/2017 0858   BUN 10 11/06/2021 1227   CREATININE 1.11 (H) 11/06/2021 1227   CREATININE 1.01 (H) 04/08/2017 0858   CALCIUM 10.5 (H) 11/06/2021 1227   GFRNONAA 65 07/20/2020 0941   GFRNONAA 59 (L) 04/08/2017 0858   GFRAA 75 07/20/2020 0941   GFRAA 68 04/08/2017 0858    BNP No results found for: "BNP"  ProBNP No results found for: "PROBNP"  Imaging: No results found.   Assessment & Plan:   Healthcare maintenance - CBC - Comprehensive metabolic panel  2. Right wrist pain  - predniSONE (DELTASONE) 20 MG tablet; Take 1 tablet (20 mg total) by mouth  daily with breakfast for 5 days.  Dispense: 5 tablet; Refill: 0  3. Tendonitis  - predniSONE (DELTASONE) 20 MG tablet; Take 1 tablet (20 mg total) by mouth daily with breakfast for 5 days.  Dispense: 5 tablet; Refill: 0   4. Encounter for screening mammogram for malignant neoplasm of breast  - MM Digital Screening; Future   Follow up:  Follow up in 6 months     Fenton Foy, NP 05/30/2022

## 2022-05-30 NOTE — Patient Instructions (Signed)
1. Healthcare maintenance  - CBC - Comprehensive metabolic panel  2. Right wrist pain  - predniSONE (DELTASONE) 20 MG tablet; Take 1 tablet (20 mg total) by mouth daily with breakfast for 5 days.  Dispense: 5 tablet; Refill: 0  3. Tendonitis  - predniSONE (DELTASONE) 20 MG tablet; Take 1 tablet (20 mg total) by mouth daily with breakfast for 5 days.  Dispense: 5 tablet; Refill: 0   4. Encounter for screening mammogram for malignant neoplasm of breast  - MM Digital Screening; Future   Follow up:  Follow up in 6 months

## 2022-05-31 LAB — CBC
Hematocrit: 39.5 % (ref 34.0–46.6)
Hemoglobin: 13.3 g/dL (ref 11.1–15.9)
MCH: 31.1 pg (ref 26.6–33.0)
MCHC: 33.7 g/dL (ref 31.5–35.7)
MCV: 93 fL (ref 79–97)
Platelets: 290 10*3/uL (ref 150–450)
RBC: 4.27 x10E6/uL (ref 3.77–5.28)
RDW: 13.7 % (ref 11.7–15.4)
WBC: 8.9 10*3/uL (ref 3.4–10.8)

## 2022-05-31 LAB — COMPREHENSIVE METABOLIC PANEL
ALT: 17 IU/L (ref 0–32)
AST: 16 IU/L (ref 0–40)
Albumin/Globulin Ratio: 1.5 (ref 1.2–2.2)
Albumin: 4.3 g/dL (ref 3.9–4.9)
Alkaline Phosphatase: 104 IU/L (ref 44–121)
BUN/Creatinine Ratio: 13 (ref 12–28)
BUN: 12 mg/dL (ref 8–27)
Bilirubin Total: 0.4 mg/dL (ref 0.0–1.2)
CO2: 23 mmol/L (ref 20–29)
Calcium: 10.1 mg/dL (ref 8.7–10.3)
Chloride: 100 mmol/L (ref 96–106)
Creatinine, Ser: 0.89 mg/dL (ref 0.57–1.00)
Globulin, Total: 2.9 g/dL (ref 1.5–4.5)
Glucose: 84 mg/dL (ref 70–99)
Potassium: 3.7 mmol/L (ref 3.5–5.2)
Sodium: 141 mmol/L (ref 134–144)
Total Protein: 7.2 g/dL (ref 6.0–8.5)
eGFR: 71 mL/min/{1.73_m2} (ref >59)

## 2022-07-04 ENCOUNTER — Telehealth: Payer: Self-pay

## 2022-07-04 MED ORDER — PRAVASTATIN SODIUM 80 MG PO TABS
80.0000 mg | ORAL_TABLET | Freq: Every day | ORAL | 3 refills | Status: DC
Start: 2022-07-04 — End: 2023-08-13

## 2022-07-04 NOTE — Telephone Encounter (Signed)
No additional notes needed  

## 2022-07-26 ENCOUNTER — Other Ambulatory Visit: Payer: Self-pay | Admitting: Nurse Practitioner

## 2022-07-26 DIAGNOSIS — I10 Essential (primary) hypertension: Secondary | ICD-10-CM

## 2022-07-30 ENCOUNTER — Telehealth: Payer: Self-pay | Admitting: Nurse Practitioner

## 2022-07-30 DIAGNOSIS — H18513 Endothelial corneal dystrophy, bilateral: Secondary | ICD-10-CM | POA: Diagnosis not present

## 2022-07-30 DIAGNOSIS — H25813 Combined forms of age-related cataract, bilateral: Secondary | ICD-10-CM | POA: Diagnosis not present

## 2022-07-30 DIAGNOSIS — H52223 Regular astigmatism, bilateral: Secondary | ICD-10-CM | POA: Diagnosis not present

## 2022-07-30 DIAGNOSIS — H40003 Preglaucoma, unspecified, bilateral: Secondary | ICD-10-CM | POA: Diagnosis not present

## 2022-07-30 NOTE — Telephone Encounter (Signed)
Patient asking if we have a certificate of healthcare provider form to complete. Please call [phone]

## 2022-08-02 NOTE — Telephone Encounter (Signed)
I have completed all the forms that I had. Please check to see if one of those forms was hers. Thanks.

## 2022-08-06 DIAGNOSIS — H25813 Combined forms of age-related cataract, bilateral: Secondary | ICD-10-CM | POA: Diagnosis not present

## 2022-08-07 ENCOUNTER — Telehealth: Payer: Self-pay

## 2022-08-07 NOTE — Telephone Encounter (Signed)
cephalexin

## 2022-08-08 NOTE — Telephone Encounter (Signed)
LMOM for patient to return call. Unsure why she needs antibiotic and see if appt is needed.

## 2022-08-15 ENCOUNTER — Other Ambulatory Visit: Payer: Self-pay | Admitting: Nurse Practitioner

## 2022-08-15 DIAGNOSIS — I10 Essential (primary) hypertension: Secondary | ICD-10-CM

## 2022-08-16 DIAGNOSIS — Z79899 Other long term (current) drug therapy: Secondary | ICD-10-CM | POA: Diagnosis not present

## 2022-08-16 DIAGNOSIS — H25812 Combined forms of age-related cataract, left eye: Secondary | ICD-10-CM | POA: Diagnosis not present

## 2022-08-16 DIAGNOSIS — H25813 Combined forms of age-related cataract, bilateral: Secondary | ICD-10-CM | POA: Diagnosis not present

## 2022-08-16 DIAGNOSIS — I10 Essential (primary) hypertension: Secondary | ICD-10-CM | POA: Diagnosis not present

## 2022-08-16 DIAGNOSIS — E785 Hyperlipidemia, unspecified: Secondary | ICD-10-CM | POA: Diagnosis not present

## 2022-08-16 DIAGNOSIS — J302 Other seasonal allergic rhinitis: Secondary | ICD-10-CM | POA: Diagnosis not present

## 2022-08-17 ENCOUNTER — Other Ambulatory Visit: Payer: Self-pay

## 2022-08-17 DIAGNOSIS — I10 Essential (primary) hypertension: Secondary | ICD-10-CM

## 2022-08-17 MED ORDER — NIFEDIPINE ER OSMOTIC RELEASE 60 MG PO TB24: 60.0000 mg | ORAL_TABLET | Freq: Every day | ORAL | 1 refills | Status: DC

## 2022-08-23 DIAGNOSIS — E785 Hyperlipidemia, unspecified: Secondary | ICD-10-CM | POA: Diagnosis not present

## 2022-08-23 DIAGNOSIS — H25811 Combined forms of age-related cataract, right eye: Secondary | ICD-10-CM | POA: Diagnosis not present

## 2022-08-23 DIAGNOSIS — I10 Essential (primary) hypertension: Secondary | ICD-10-CM | POA: Diagnosis not present

## 2022-10-10 ENCOUNTER — Ambulatory Visit (INDEPENDENT_AMBULATORY_CARE_PROVIDER_SITE_OTHER): Payer: Medicare HMO

## 2022-10-10 ENCOUNTER — Encounter: Payer: Self-pay | Admitting: Podiatry

## 2022-10-10 ENCOUNTER — Ambulatory Visit (INDEPENDENT_AMBULATORY_CARE_PROVIDER_SITE_OTHER): Payer: Medicare HMO | Admitting: Podiatry

## 2022-10-10 DIAGNOSIS — M21611 Bunion of right foot: Secondary | ICD-10-CM

## 2022-10-10 DIAGNOSIS — M21619 Bunion of unspecified foot: Secondary | ICD-10-CM | POA: Diagnosis not present

## 2022-10-10 DIAGNOSIS — M7751 Other enthesopathy of right foot: Secondary | ICD-10-CM | POA: Diagnosis not present

## 2022-10-10 MED ORDER — TRIAMCINOLONE ACETONIDE 10 MG/ML IJ SUSP
10.0000 mg | Freq: Once | INTRAMUSCULAR | Status: AC
  Administered 2022-10-10: 10 mg

## 2022-10-11 NOTE — Progress Notes (Signed)
Subjective:   Patient ID: Teresa Collier, female   DOB: 71 y.o.   MRN: [id number]   HPI Patient states she had surgery years ago on her right big toe joint and just recently and it started to get sore and swollen.  States it does not been great but overall it is done very well.  Patient does not smoke likes to be active   Review of Systems  All other systems reviewed and are negative.       Objective:  Physical Exam Vitals and nursing note reviewed.  Constitutional:      Appearance: She is well-developed.  Pulmonary:     Effort: Pulmonary effort is normal.  Musculoskeletal:        General: Normal range of motion.  Skin:    General: Skin is warm.  Neurological:     Mental Status: She is alert.     Neurovascular status found to be intact muscle strength found to be adequate range of motion within normal limits.  Patient is noted to have inflammation pain around the first MPJ right and there is a 6 incision from previous surgery done years ago.  There is mild fluid buildup around the joint surface with discomfort     Assessment:  Probability for acute inflammatory capsulitis around the first MPJ right cannot rule out gout     Plan:  H&P reviewed condition and went ahead today and I will get a focus on this is an acute condition.  I did sterile prep injected periarticular around the first MPJ 3 mg Kenalog 5 mg Xylocaine and advised on rigid bottom shoes.  Reappoint as symptoms indicate  X-rays do indicate that there is some narrowness of the joint surface previous pins from surgery are intact good alignment noted

## 2022-11-01 ENCOUNTER — Ambulatory Visit (INDEPENDENT_AMBULATORY_CARE_PROVIDER_SITE_OTHER): Payer: Medicare HMO | Admitting: Nurse Practitioner

## 2022-11-01 ENCOUNTER — Encounter: Payer: Self-pay | Admitting: Nurse Practitioner

## 2022-11-01 VITALS — BP 133/60 | HR 84 | Temp 98.3°F | Ht 64.0 in | Wt 180.0 lb

## 2022-11-01 DIAGNOSIS — J069 Acute upper respiratory infection, unspecified: Secondary | ICD-10-CM | POA: Diagnosis not present

## 2022-11-01 LAB — POCT INFLUENZA A/B: Influenza A, POC: NEGATIVE

## 2022-11-01 LAB — POC COVID19 BINAXNOW: SARS Coronavirus 2 Ag: NEGATIVE

## 2022-11-01 MED ORDER — BENZONATATE 200 MG PO CAPS: 200.0000 mg | ORAL_CAPSULE | Freq: Two times a day (BID) | ORAL | 0 refills | Status: DC | PRN

## 2022-11-01 MED ORDER — AMOXICILLIN 875 MG PO TABS: 875.0000 mg | ORAL_TABLET | Freq: Two times a day (BID) | ORAL | 0 refills | Status: AC

## 2022-11-01 MED ORDER — PREDNISONE 20 MG PO TABS: 20.0000 mg | ORAL_TABLET | Freq: Every day | ORAL | 0 refills | Status: AC

## 2022-11-01 NOTE — Assessment & Plan Note (Signed)
-   POC COVID-19 - POCT Influenza A/B - amoxicillin (AMOXIL) 875 MG tablet; Take 1 tablet (875 mg total) by mouth 2 (two) times daily for 10 days.  Dispense: 20 tablet; Refill: 0 - predniSONE (DELTASONE) 20 MG tablet; Take 1 tablet (20 mg total) by mouth daily with breakfast for 5 days.  Dispense: 5 tablet; Refill: 0 - benzonatate (TESSALON) 200 MG capsule; Take 1 capsule (200 mg total) by mouth 2 (two) times daily as needed for cough.  Dispense: 20 capsule; Refill: 0   Follow up:  Follow up as scheduled

## 2022-11-01 NOTE — Progress Notes (Signed)
$'@Patient'j$  ID: Teresa Collier, female    DOB: 12-Sep-1951, 72 y.o.   MRN: [id number]  Chief Complaint  Patient presents with   Cough    PT STATED--dry cough, headache, fatigue--yesterday.    Referring provider: Fenton Foy, NP   HPI  Teresa Collier presents for follow up. She  has a past medical history of Blood transfusion without reported diagnosis, Cataract, Hearing loss in right ear, Heart murmur, Hyperlipidemia, Hypertension, Hypokalemia, Muscle spasms of lower extremity (06/2020), Seasonal allergies, and Vitamin D deficiency.    Patient presents today for sick visit.  She states that she has been coughing and fatigue and headaches for the last couple days.  Patient tested negative for flu and COVID in office today.  Patient has been taking OTC Advil.  Denies f/c/s, n/v/d, hemoptysis, PND, leg swelling Denies chest pain or edema    Flu and covid negative         No Known Allergies  Immunization History  Administered Date(s) Administered   Influenza,inj,Quad PF,6+ Mos 08/27/2016, 12/11/2017, 09/08/2018, 09/09/2019, 07/20/2020   Influenza,inj,quad, With Preservative 10/21/2013   Influenza-Unspecified 10/21/2013, 09/04/2021   Pneumococcal Conjugate-13 09/24/2016   Tdap 10/30/2007, 09/24/2016    Past Medical History:  Diagnosis Date   Blood transfusion without reported diagnosis    1973   Cataract    "beginnings"   Hearing loss in right ear    Heart murmur    Hyperlipidemia    Hypertension    Hypokalemia    Muscle spasms of lower extremity 06/2020   Seasonal allergies    Vitamin D deficiency     Tobacco History: Social History   Tobacco Use  Smoking Status Former   Packs/day: 2.00   Years: 20.00   Total pack years: 40.00   Types: Cigarettes   Quit date: 01/27/2009   Years since quitting: 13.7  Smokeless Tobacco Never   Counseling given: Not Answered   Outpatient Encounter Medications as of 11/01/2022  Medication Sig   albuterol (VENTOLIN  HFA) 108 (90 Base) MCG/ACT inhaler Inhale 2 puffs into the lungs every 6 (six) hours as needed for wheezing or shortness of breath.   amoxicillin (AMOXIL) 875 MG tablet Take 1 tablet (875 mg total) by mouth 2 (two) times daily for 10 days.   baclofen (LIORESAL) 10 MG tablet Take 1 tablet (10 mg total) by mouth 2 (two) times daily.   benzonatate (TESSALON) 200 MG capsule Take 1 capsule (200 mg total) by mouth 2 (two) times daily as needed for cough.   cephALEXin (KEFLEX) 500 MG capsule Take 1 capsule (500 mg total) by mouth 4 (four) times daily.   cyclobenzaprine (FLEXERIL) 5 MG tablet Take 1 tablet (5 mg total) by mouth 3 (three) times daily as needed for muscle spasms.   fluticasone (FLONASE) 50 MCG/ACT nasal spray Place 2 sprays into both nostrils daily.   hydrochlorothiazide (HYDRODIURIL) 25 MG tablet Take 1 tablet (25 mg total) by mouth daily.   lidocaine (LIDODERM) 5 % Place 1 patch onto the skin daily. Remove & Discard patch within 12 hours or as directed by MD   loratadine (CLARITIN) 10 MG tablet Take 1 tablet (10 mg total) by mouth daily.   Multiple Vitamin (MULTIVITAMIN) tablet Take 1 tablet by mouth daily.   NIFEdipine (PROCARDIA XL/NIFEDICAL XL) 60 MG 24 hr tablet Take 1 tablet (60 mg total) by mouth daily.   pravastatin (PRAVACHOL) 80 MG tablet Take 1 tablet (80 mg total) by mouth daily.   predniSONE (DELTASONE) 20  MG tablet Take 1 tablet (20 mg total) by mouth daily with breakfast for 5 days.   Facility-Administered Encounter Medications as of 11/01/2022  Medication   0.9 %  sodium chloride infusion     Review of Systems  Review of Systems  Constitutional: Negative.   HENT: Negative.    Respiratory:  Positive for cough.   Cardiovascular: Negative.   Gastrointestinal: Negative.   Allergic/Immunologic: Negative.   Neurological: Negative.   Psychiatric/Behavioral: Negative.         Physical Exam  BP 133/60   Pulse 84   Temp 98.3 F (36.8 C)   Ht '5\' 4"'$  (1.626 m)   Wt  180 lb (81.6 kg)   SpO2 98%   BMI 30.90 kg/m   Wt Readings from Last 5 Encounters:  11/01/22 180 lb (81.6 kg)  05/30/22 180 lb 3.2 oz (81.7 kg)  02/28/22 186 lb (84.4 kg)  11/06/21 187 lb 6.4 oz (85 kg)  05/05/21 205 lb (93 kg)     Physical Exam Vitals and nursing note reviewed.  Constitutional:      General: She is not in acute distress.    Appearance: She is well-developed.  Cardiovascular:     Rate and Rhythm: Normal rate and regular rhythm.  Pulmonary:     Effort: Pulmonary effort is normal.     Breath sounds: Normal breath sounds.  Neurological:     Mental Status: She is alert and oriented to person, place, and time.      Lab Results:  CBC    Component Value Date/Time   WBC 8.9 05/30/2022 1203   WBC 8.5 04/08/2017 0858   RBC 4.27 05/30/2022 1203   RBC 4.46 04/08/2017 0858   HGB 13.3 05/30/2022 1203   HCT 39.5 05/30/2022 1203   PLT 290 05/30/2022 1203   MCV 93 05/30/2022 1203   MCH 31.1 05/30/2022 1203   MCH 30.0 04/08/2017 0858   MCHC 33.7 05/30/2022 1203   MCHC 32.6 04/08/2017 0858   RDW 13.7 05/30/2022 1203   LYMPHSABS 3.3 (H) 11/06/2021 1227   MONOABS 510 04/08/2017 0858   EOSABS 0.1 11/06/2021 1227   BASOSABS 0.0 11/06/2021 1227    BMET    Component Value Date/Time   NA 141 05/30/2022 1203   K 3.7 05/30/2022 1203   CL 100 05/30/2022 1203   CO2 23 05/30/2022 1203   GLUCOSE 84 05/30/2022 1203   GLUCOSE 94 04/08/2017 0858   BUN 12 05/30/2022 1203   CREATININE 0.89 05/30/2022 1203   CREATININE 1.01 (H) 04/08/2017 0858   CALCIUM 10.1 05/30/2022 1203   GFRNONAA 65 07/20/2020 0941   GFRNONAA 59 (L) 04/08/2017 0858   GFRAA 75 07/20/2020 0941   GFRAA 68 04/08/2017 0858    BNP No results found for: "BNP"  ProBNP No results found for: "PROBNP"  Imaging: DG Foot 2 Views Right  Result Date: 10/11/2022 Please see detailed radiograph report in office note.    Assessment & Plan:   Upper respiratory tract infection - POC COVID-19 -  POCT Influenza A/B - amoxicillin (AMOXIL) 875 MG tablet; Take 1 tablet (875 mg total) by mouth 2 (two) times daily for 10 days.  Dispense: 20 tablet; Refill: 0 - predniSONE (DELTASONE) 20 MG tablet; Take 1 tablet (20 mg total) by mouth daily with breakfast for 5 days.  Dispense: 5 tablet; Refill: 0 - benzonatate (TESSALON) 200 MG capsule; Take 1 capsule (200 mg total) by mouth 2 (two) times daily as needed for cough.  Dispense:  20 capsule; Refill: 0   Follow up:  Follow up as scheduled     Fenton Foy, NP 11/01/2022

## 2022-11-01 NOTE — Patient Instructions (Addendum)
1. Upper respiratory tract infection, unspecified type  - POC COVID-19 - POCT Influenza A/B - amoxicillin (AMOXIL) 875 MG tablet; Take 1 tablet (875 mg total) by mouth 2 (two) times daily for 10 days.  Dispense: 20 tablet; Refill: 0 - predniSONE (DELTASONE) 20 MG tablet; Take 1 tablet (20 mg total) by mouth daily with breakfast for 5 days.  Dispense: 5 tablet; Refill: 0 - benzonatate (TESSALON) 200 MG capsule; Take 1 capsule (200 mg total) by mouth 2 (two) times daily as needed for cough.  Dispense: 20 capsule; Refill: 0   Follow up:  Follow up as scheduled

## 2022-11-05 ENCOUNTER — Telehealth: Payer: Self-pay

## 2022-11-05 NOTE — Telephone Encounter (Signed)
[facility] advised that pt benzonatate is not covered. I called pt to find out if she needed it still since it won't be covered. No answer just lvm. [place]

## 2022-11-08 ENCOUNTER — Telehealth: Payer: Self-pay | Admitting: Nurse Practitioner

## 2022-11-08 NOTE — Telephone Encounter (Signed)
Nurse Line VM - Pt returned phone call regarding medication. She didn't understand the message left for her and requests a return call

## 2022-11-15 DIAGNOSIS — Z1231 Encounter for screening mammogram for malignant neoplasm of breast: Secondary | ICD-10-CM

## 2022-11-23 ENCOUNTER — Encounter: Payer: Self-pay | Admitting: Pharmacist

## 2022-11-28 ENCOUNTER — Other Ambulatory Visit: Payer: Self-pay | Admitting: Podiatry

## 2022-11-28 ENCOUNTER — Ambulatory Visit (INDEPENDENT_AMBULATORY_CARE_PROVIDER_SITE_OTHER): Payer: Medicare HMO | Admitting: Podiatry

## 2022-11-28 DIAGNOSIS — M109 Gout, unspecified: Secondary | ICD-10-CM | POA: Diagnosis not present

## 2022-11-28 DIAGNOSIS — M7751 Other enthesopathy of right foot: Secondary | ICD-10-CM

## 2022-11-28 NOTE — Progress Notes (Signed)
Subjective:   Patient ID: Teresa Collier, female   DOB: 72 y.o.   MRN: [id number]   HPI Patient states she has had a lot of pain in her right ankle and states that it has been hard for her to walk with it and this is the second time recently that she has had a flareup of pain in the right foot   ROS      Objective:  Physical Exam  Neurovascular status intact with inflammation pain of the right dorsal ankle after having had a problem with the right first MPJ several months ago     Assessment:  Strong possibility for gout as an inflammatory condition with capsulitis inflammation of the ankle joint right     Plan:  H&P educated him on gout currently went ahead did sterile prep and injected the ankle joint right 3 mg Kenalog 5 mg Xylocaine and advised on anti-inflammatories.  We will get results of blood work and decide about consideration for allopurinol which I reviewed with her today or other treatment and did give her foods to avoid at the current time

## 2022-11-29 LAB — CBC WITH DIFFERENTIAL/PLATELET
Basophils Absolute: 0 10*3/uL (ref 0.0–0.2)
Basos: 1 %
EOS (ABSOLUTE): 0 10*3/uL (ref 0.0–0.4)
Eos: 1 %
Hematocrit: 38 % (ref 34.0–46.6)
Hemoglobin: 13 g/dL (ref 11.1–15.9)
Immature Grans (Abs): 0 10*3/uL (ref 0.0–0.1)
Immature Granulocytes: 0 %
Lymphocytes Absolute: 2.1 10*3/uL (ref 0.7–3.1)
Lymphs: 26 %
MCH: 31.3 pg (ref 26.6–33.0)
MCHC: 34.2 g/dL (ref 31.5–35.7)
MCV: 92 fL (ref 79–97)
Monocytes Absolute: 1 10*3/uL — ABNORMAL HIGH (ref 0.1–0.9)
Monocytes: 12 %
Neutrophils Absolute: 5 10*3/uL (ref 1.4–7.0)
Neutrophils: 60 %
Platelets: 288 10*3/uL (ref 150–450)
RBC: 4.15 x10E6/uL (ref 3.77–5.28)
RDW: 13 % (ref 11.7–15.4)
WBC: 8.2 10*3/uL (ref 3.4–10.8)

## 2022-11-29 LAB — RHEUMATOID FACTOR: Rheumatoid fact SerPl-aCnc: <10 IU/mL (ref <14.0)

## 2022-11-29 LAB — COMPREHENSIVE METABOLIC PANEL
ALT: 16 IU/L (ref 0–32)
AST: 17 IU/L (ref 0–40)
Albumin/Globulin Ratio: 1.7 (ref 1.2–2.2)
Albumin: 4.3 g/dL (ref 3.9–4.9)
Alkaline Phosphatase: 100 IU/L (ref 44–121)
BUN/Creatinine Ratio: 7 — ABNORMAL LOW (ref 12–28)
BUN: 7 mg/dL — ABNORMAL LOW (ref 8–27)
Bilirubin Total: 0.4 mg/dL (ref 0.0–1.2)
CO2: 25 mmol/L (ref 20–29)
Calcium: 9.7 mg/dL (ref 8.7–10.3)
Chloride: 101 mmol/L (ref 96–106)
Creatinine, Ser: 0.97 mg/dL (ref 0.57–1.00)
Globulin, Total: 2.6 g/dL (ref 1.5–4.5)
Glucose: 96 mg/dL (ref 70–99)
Potassium: 3.9 mmol/L (ref 3.5–5.2)
Sodium: 143 mmol/L (ref 134–144)
Total Protein: 6.9 g/dL (ref 6.0–8.5)
eGFR: 63 mL/min/{1.73_m2} (ref >59)

## 2022-11-29 LAB — SEDIMENTATION RATE: Sed Rate: 12 mm/hr (ref 0–40)

## 2022-11-29 LAB — C-REACTIVE PROTEIN: CRP: 44 mg/L — ABNORMAL HIGH (ref 0–10)

## 2022-11-30 ENCOUNTER — Ambulatory Visit: Payer: Medicare HMO | Admitting: Nurse Practitioner

## 2022-12-05 ENCOUNTER — Ambulatory Visit (INDEPENDENT_AMBULATORY_CARE_PROVIDER_SITE_OTHER): Payer: Medicare HMO | Admitting: Podiatry

## 2022-12-05 ENCOUNTER — Encounter: Payer: Self-pay | Admitting: Podiatry

## 2022-12-05 DIAGNOSIS — M109 Gout, unspecified: Secondary | ICD-10-CM | POA: Diagnosis not present

## 2022-12-05 DIAGNOSIS — M7751 Other enthesopathy of right foot: Secondary | ICD-10-CM | POA: Diagnosis not present

## 2022-12-05 DIAGNOSIS — Z472 Encounter for removal of internal fixation device: Secondary | ICD-10-CM | POA: Diagnosis not present

## 2022-12-05 DIAGNOSIS — M21619 Bunion of unspecified foot: Secondary | ICD-10-CM | POA: Diagnosis not present

## 2022-12-06 NOTE — Progress Notes (Signed)
Subjective:   Patient ID: Teresa Collier, female   DOB: 72 y.o.   MRN: [id number]   HPI Patient states she is doing quite a bit better stated the injection helped her a lot she has had some history of this and at this point feels like she is doing much better   ROS      Objective:  Physical Exam  Neurovascular status intact with diminished discomfort inflammation around the first MPJ right fluid buildup slightly noted but no other pathology no other conditions noted     Assessment:  Strong possibility for gout with inflammatory capsulitis first MPJ right     Plan:  Discussed again at great length gout and its causes and we discussed food modification and possible medications in future and blood work but at this point since she is doing so much better we will just get a watch it and see how she progresses.  All questions answered today and diet distributed

## 2022-12-10 ENCOUNTER — Other Ambulatory Visit: Payer: Self-pay | Admitting: Podiatry

## 2022-12-10 ENCOUNTER — Ambulatory Visit (INDEPENDENT_AMBULATORY_CARE_PROVIDER_SITE_OTHER): Payer: Medicare HMO | Admitting: Podiatry

## 2022-12-10 ENCOUNTER — Ambulatory Visit (INDEPENDENT_AMBULATORY_CARE_PROVIDER_SITE_OTHER): Payer: Medicare HMO

## 2022-12-10 DIAGNOSIS — M7671 Peroneal tendinitis, right leg: Secondary | ICD-10-CM

## 2022-12-10 DIAGNOSIS — M7661 Achilles tendinitis, right leg: Secondary | ICD-10-CM | POA: Diagnosis not present

## 2022-12-10 DIAGNOSIS — M79671 Pain in right foot: Secondary | ICD-10-CM

## 2022-12-10 MED ORDER — TRIAMCINOLONE ACETONIDE 10 MG/ML IJ SUSP
10.0000 mg | Freq: Once | INTRAMUSCULAR | Status: AC
  Administered 2022-12-10: 10 mg

## 2022-12-10 NOTE — Progress Notes (Signed)
Subjective:   Patient ID: Teresa Collier, female   DOB: 72 y.o.   MRN: [id number]   HPI Patient presents stating she has been having severe pain in the back of her right heel on the outside and states she cannot bear weight down on her foot and she needs to be able to work.  States that it has been very tender and hard to be active and the big toe joints are doing fine   ROS      Objective:  Physical Exam  Ocular status intact inflammation pain around the peroneal group as it comes underneath the lateral malleolus right and slightly proximal and moderate discomfort also in the Achilles tendon which may be compensatory with inability to bear weight on the foot currently     Assessment:  Acute peroneal tendinitis right with moderate Achilles tendinitis right with possibility that part of this is due to compensation for her previous problems     Plan:  H&P reviewed did sterile prep carefully injected the lateral tendon complex after explaining risk to patient 3 mg Dexasone Kenalog 5 mg Xylocaine and then applied air fracture walker to completely immobilize and take all stress off the Achilles and peroneal tendon group.  May require MRI if symptoms do not get better and educated her on the usage of this device.  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

## 2022-12-11 ENCOUNTER — Telehealth: Payer: Self-pay | Admitting: *Deleted

## 2022-12-11 NOTE — Telephone Encounter (Signed)
Patient is calling because she is still  in pain after her injection one day ago, can something be called into pharmacy to help with this pain?

## 2022-12-12 NOTE — Telephone Encounter (Signed)
Tylenol or ibuprophen

## 2022-12-12 NOTE — Telephone Encounter (Signed)
Patient has been updated thru voice message

## 2022-12-14 ENCOUNTER — Ambulatory Visit: Payer: Medicare HMO | Admitting: Nurse Practitioner

## 2022-12-17 ENCOUNTER — Ambulatory Visit (INDEPENDENT_AMBULATORY_CARE_PROVIDER_SITE_OTHER): Payer: Medicare HMO | Admitting: Podiatry

## 2022-12-17 ENCOUNTER — Encounter: Payer: Self-pay | Admitting: Podiatry

## 2022-12-17 ENCOUNTER — Telehealth: Payer: Self-pay | Admitting: Podiatry

## 2022-12-17 DIAGNOSIS — M7671 Peroneal tendinitis, right leg: Secondary | ICD-10-CM

## 2022-12-17 DIAGNOSIS — M7661 Achilles tendinitis, right leg: Secondary | ICD-10-CM

## 2022-12-17 NOTE — Progress Notes (Signed)
Subjective:   Patient ID: Teresa Collier, female   DOB: 72 y.o.   MRN: [id number]   HPI Patient states the inside of the ankle is doing quite a bit better I am getting some pain in the outside of the ankle which can be bothersome   ROS      Objective:  Physical Exam  Neurovascular status intact posterior tibial tendon right is improved with only mild discomfort with depression of the arch and inflammation pain of the sinus tarsi right     Assessment:  Posterior tibial tendinitis improving with medication and bracing with inflammation of the sinus tarsi     Plan:  H&P reviewed both conditions and went ahead today with patient also having some discomfort in the outside of the ankle which seems better at this time.  Continue with stretching exercises if symptoms get worse we will need to consider MRI

## 2022-12-17 NOTE — Telephone Encounter (Signed)
I called pt. To speak with her about her FMLA paperwork.

## 2022-12-18 ENCOUNTER — Telehealth: Payer: Self-pay | Admitting: Podiatry

## 2022-12-18 NOTE — Telephone Encounter (Signed)
Patient has been out of work since 12/07/2022, she was wondering when she's released to return to work?

## 2022-12-19 ENCOUNTER — Telehealth: Payer: Self-pay | Admitting: Nurse Practitioner

## 2022-12-19 ENCOUNTER — Telehealth: Payer: Self-pay | Admitting: Podiatry

## 2022-12-19 NOTE — Telephone Encounter (Signed)
Contacted [employer] to schedule their annual wellness visit. Appointment made for 12/27/2022.  Thank you,  [place] Direct dial  [phone]

## 2022-12-19 NOTE — Telephone Encounter (Signed)
I left pt. A message to call me back.

## 2022-12-19 NOTE — Telephone Encounter (Signed)
Whenever she feels she is ready to go

## 2022-12-27 ENCOUNTER — Ambulatory Visit: Payer: Medicare HMO

## 2022-12-27 ENCOUNTER — Telehealth: Payer: Self-pay

## 2022-12-27 NOTE — Telephone Encounter (Signed)
Contacted patient on preferred number listed in notes for scheduled AWV.  Patient stated unable to complete visit at this time will call back to reschedule

## 2022-12-31 ENCOUNTER — Ambulatory Visit: Payer: Medicare HMO | Admitting: Podiatry

## 2022-12-31 ENCOUNTER — Encounter: Payer: Self-pay | Admitting: Podiatry

## 2023-01-28 ENCOUNTER — Telehealth: Payer: Self-pay | Admitting: Podiatry

## 2023-01-28 NOTE — Telephone Encounter (Signed)
That's fine

## 2023-01-28 NOTE — Telephone Encounter (Signed)
Patient would like to apply for intermittent family leave, just for when she needs to be out of work for her foot foot pain

## 2023-02-04 ENCOUNTER — Telehealth: Payer: Self-pay | Admitting: Nurse Practitioner

## 2023-02-04 NOTE — Telephone Encounter (Signed)
Called patient to schedule Medicare Annual Wellness Visit (AWV). Left message for patient to call back and schedule Medicare Annual Wellness Visit (AWV).  Last date of AWV: AWVI eligible as of 05/30/2019  Please schedule an AWVI appointment at any time with [facility] ANNUAL WELLNESS VISIT.  If any questions, please contact me at [phone].    Thank you,  Jibreel Fedewa  [facility] Direct dial  [phone]   

## 2023-02-27 ENCOUNTER — Ambulatory Visit (HOSPITAL_COMMUNITY)
Admission: EM | Admit: 2023-02-27 | Discharge: 2023-02-27 | Disposition: A | Discharge status: Home / Self Care | Payer: Medicare HMO | Attending: Emergency Medicine | Admitting: Emergency Medicine

## 2023-02-27 ENCOUNTER — Encounter (HOSPITAL_COMMUNITY): Payer: Self-pay

## 2023-02-27 DIAGNOSIS — J0100 Acute maxillary sinusitis, unspecified: Secondary | ICD-10-CM

## 2023-02-27 MED ORDER — CETIRIZINE HCL 10 MG PO TABS: 10.0000 mg | ORAL_TABLET | Freq: Every day | ORAL | 2 refills | Status: AC

## 2023-02-27 MED ORDER — BENZONATATE 200 MG PO CAPS: 200.0000 mg | ORAL_CAPSULE | Freq: Three times a day (TID) | ORAL | 0 refills | Status: AC

## 2023-02-27 MED ORDER — AMOXICILLIN-POT CLAVULANATE 875-125 MG PO TABS: 1.0000 | ORAL_TABLET | Freq: Two times a day (BID) | ORAL | 0 refills | Status: DC

## 2023-02-27 NOTE — ED Provider Notes (Signed)
[facility]    CSN: [id number] Arrival date & time: 02/27/23  0803      History   Chief Complaint Chief Complaint  Patient presents with   Cough   Sinus Problem    HPI Teresa Collier is a 72 y.o. female.    Reports Sunday sinus congestion started, cough started last night from mucus / phglem congestion  Sinus pain and pressure yesterday  Denies fevers, no recent sick contacts  Sneezing a lot at work  Tried Tylenol sinus HA, 1/2 an allergy pill (Benadryl)   The history is provided by the patient and medical records.  Cough Associated symptoms: sore throat   Associated symptoms: no chest pain, no chills, no fever, no shortness of breath and no wheezing   Sinus Problem Pertinent negatives include no chest pain, no abdominal pain and no shortness of breath.    Past Medical History:  Diagnosis Date   Blood transfusion without reported diagnosis    1973   Cataract    "beginnings"   Hearing loss in right ear    Heart murmur    Hyperlipidemia    Hypertension    Hypokalemia    Muscle spasms of lower extremity 06/2020   Seasonal allergies    Vitamin D deficiency     Patient Active Problem List   Diagnosis Date Noted   Upper respiratory tract infection 11/01/2022   Healthcare maintenance 05/30/2022   Shortness of breath 11/02/2019   Vitamin D deficiency 11/02/2019   Right lower quadrant abdominal pain 09/09/2019   Bilateral hearing loss 03/09/2019   Cataract 03/09/2019   Seasonal allergies 03/09/2019   Essential hypertension 10/16/2016   Hyperlipidemia 10/16/2016   Closed disp fracture of left lateral malleolus with routine healing 08/23/2016   Acute right-sided low back pain without sciatica 05/29/2016   Prediabetes 05/29/2016   Obesity (BMI 30-39.9) 12/05/2015    Past Surgical History:  Procedure Laterality Date   APPENDECTOMY     BUNIONECTOMY     COLONOSCOPY     OPEN REDUCTION INTERNAL FIXATION (ORIF) FOOT LISFRANC FRACTURE Left 07/25/2016    Procedure: OPEN REDUCTION INTERNAL FIXATION (ORIF) FOOT LISFRANC FRACTURE;  Surgeon: Tarry Kos, MD;  Location: [facility];  Service: Orthopedics;  Laterality: Left;   ORIF ANKLE FRACTURE Left 07/25/2016   Procedure: OPEN REDUCTION INTERNAL FIXATION (ORIF) LEFT BIMALLEOLAR  ANKLE AND LISFRANC FRACTURES;  Surgeon: Tarry Kos, MD;  Location: [facility];  Service: Orthopedics;  Laterality: Left;    OB History     Gravida  6   Para  5   Term  5   Preterm      AB      Living  5      SAB      IAB      Ectopic      Multiple      Live Births               Home Medications    Prior to Admission medications   Medication Sig Start Date End Date Taking? Authorizing Provider  amoxicillin-clavulanate (AUGMENTIN) 875-125 MG tablet Take 1 tablet by mouth every 12 (twelve) hours. 02/27/23  Yes Rinaldo Ratel, [place] N, FNP  benzonatate (TESSALON) 200 MG capsule Take 1 capsule (200 mg total) by mouth every 8 (eight) hours for 5 days. 02/27/23 03/04/23 Yes Rinaldo Ratel, [place] N, FNP  cetirizine (ZYRTEC ALLERGY) 10 MG tablet Take 1 tablet (10 mg total) by mouth daily. 02/27/23 05/28/23  Yes Rinaldo Ratel, [place] N, FNP  hydrochlorothiazide (HYDRODIURIL) 25 MG tablet Take 1 tablet (25 mg total) by mouth daily. 05/23/22  Yes Ivonne Andrew, NP  NIFEdipine (PROCARDIA XL/NIFEDICAL XL) 60 MG 24 hr tablet Take 1 tablet (60 mg total) by mouth daily. 08/17/22  Yes Ivonne Andrew, NP  pravastatin (PRAVACHOL) 80 MG tablet Take 1 tablet (80 mg total) by mouth daily. 07/04/22 07/04/23 Yes Ivonne Andrew, NP  albuterol (VENTOLIN HFA) 108 (90 Base) MCG/ACT inhaler Inhale 2 puffs into the lungs every 6 (six) hours as needed for wheezing or shortness of breath. 07/20/20   Kallie Locks, FNP    Family History Family History  Problem Relation Age of Onset   Esophageal cancer Father    Colon cancer Neg Hx    Rectal cancer Neg Hx    Stomach cancer Neg Hx     Social  History Social History   Tobacco Use   Smoking status: Former    Packs/day: 2.00    Years: 20.00    Additional pack years: 0.00    Total pack years: 40.00    Types: Cigarettes    Quit date: 01/27/2009    Years since quitting: 14.0   Smokeless tobacco: Never  Vaping Use   Vaping Use: Never used  Substance Use Topics   Alcohol use: Not Currently    Comment: stopped drinking 2010   Drug use: No     Allergies   Patient has no known allergies.   Review of Systems Review of Systems  Constitutional:  Negative for chills, fatigue and fever.  HENT:  Positive for congestion, sinus pressure, sinus pain, sneezing and sore throat.   Respiratory:  Positive for cough. Negative for shortness of breath and wheezing.   Cardiovascular:  Negative for chest pain.  Gastrointestinal:  Negative for abdominal pain, diarrhea, nausea and vomiting.     Physical Exam Triage Vital Signs ED Triage Vitals  Enc Vitals Group     BP 02/27/23 0834 135/74     Pulse Rate 02/27/23 0834 (!) 104     Resp 02/27/23 0834 18     Temp 02/27/23 0834 98.6 F (37 C)     Temp Source 02/27/23 0834 Oral     SpO2 02/27/23 0834 94 %     Weight 02/27/23 0834 203 lb (92.1 kg)     Height 02/27/23 0834 5\' 4"  (1.626 m)     Head Circumference --      Peak Flow --      Pain Score 02/27/23 0832 5     Pain Loc --      Pain Edu? --      Excl. in GC? --    No data found.  Updated Vital Signs BP 135/74 (BP Location: Left Wrist)   Pulse (!) 104   Temp 98.6 F (37 C) (Oral)   Resp 18   Ht 5\' 4"  (1.626 m)   Wt 203 lb (92.1 kg)   SpO2 94%   BMI 34.84 kg/m   Visual Acuity Right Eye Distance:   Left Eye Distance:   Bilateral Distance:    Right Eye Near:   Left Eye Near:    Bilateral Near:     Physical Exam Vitals and nursing note reviewed.  Constitutional:      Appearance: Normal appearance.  HENT:     Head: Normocephalic and atraumatic.     Right Ear: External ear normal.     Left Ear: External ear  normal.  Nose: Congestion present.     Mouth/Throat:     Mouth: Mucous membranes are moist.     Pharynx: Posterior oropharyngeal erythema present.  Eyes:     General: No scleral icterus.    Conjunctiva/sclera: Conjunctivae normal.  Cardiovascular:     Rate and Rhythm: Normal rate and regular rhythm.     Heart sounds: Normal heart sounds. No murmur heard. Pulmonary:     Effort: Pulmonary effort is normal. No respiratory distress.     Breath sounds: Normal breath sounds.  Abdominal:     General: Abdomen is flat.  Musculoskeletal:        General: No swelling. Normal range of motion.     Cervical back: Normal range of motion.  Skin:    General: Skin is warm and dry.     Capillary Refill: Capillary refill takes less than 2 seconds.  Neurological:     General: No focal deficit present.     Mental Status: She is alert and oriented to person, place, and time.  Psychiatric:        Mood and Affect: Mood normal.        Behavior: Behavior normal.      UC Treatments / Results  Labs (all labs ordered are listed, but only abnormal results are displayed) Labs Reviewed - No data to display  EKG   Radiology No results found.  Procedures Procedures (including critical care time)  Medications Ordered in UC Medications - No data to display  Initial Impression / Assessment and Plan / UC Course  I have reviewed the triage vital signs and the nursing notes.  Pertinent labs & imaging results that were available during my care of the patient were reviewed by me and considered in my medical decision making (see chart for details).  Vitals and triage reviewed, patient is hemodynamically stable.  Patient with congestion, posterior pharynx erythema and maxillary sinus tenderness on physical exam.  Lungs vesicular posteriorly.  Due to symptoms and age, history of bacterial sinusitis, will cover for ABS with Augmentin.  Strict follow-up precautions given, patient verbalized understanding.   Symptomatic relief discussed.  No questions at this time.     Final Clinical Impressions(s) / UC Diagnoses   Final diagnoses:  Acute non-recurrent maxillary sinusitis     Discharge Instructions      Your symptoms are consistent with a bacterial sinus infection, please take all antibiotics as prescribed and until finished.  You can take them with food to help prevent gastrointestinal upset.  You take the [facility] every 8 hours as needed for cough suppression.  I advise starting on a daily antihistamine like Zyrtec, this is nondrowsy.  This should help with your sneezing and allergic symptoms.  For your sore throat you can do warm saline gargles, sleep with a humidifier, tea with warm honey and over-the-counter cough drops and lozenges.  Please return to clinic or follow-up with your primary care provider if you develop worsening of symptoms, or no improvement despite antibiotics.      ED Prescriptions     Medication Sig Dispense Auth. Provider   amoxicillin-clavulanate (AUGMENTIN) 875-125 MG tablet Take 1 tablet by mouth every 12 (twelve) hours. 14 tablet Rinaldo Ratel, [place] N, [place]   benzonatate (TESSALON) 200 MG capsule Take 1 capsule (200 mg total) by mouth every 8 (eight) hours for 5 days. 15 capsule Rinaldo Ratel, [place] N, [place]   cetirizine (ZYRTEC ALLERGY) 10 MG tablet Take 1 tablet (10 mg total) by mouth daily. 30 tablet  Nishaan Stanke, [place] N, FNP      PDMP not reviewed this encounter.   Rinaldo Ratel [place] N, [place] 02/27/23 [phone]

## 2023-02-27 NOTE — Discharge Instructions (Addendum)
Your symptoms are consistent with a bacterial sinus infection, please take all antibiotics as prescribed and until finished.  You can take them with food to help prevent gastrointestinal upset.  You take the [facility] every 8 hours as needed for cough suppression.  I advise starting on a daily antihistamine like Zyrtec, this is nondrowsy.  This should help with your sneezing and allergic symptoms.  For your sore throat you can do warm saline gargles, sleep with a humidifier, tea with warm honey and over-the-counter cough drops and lozenges.  Please return to clinic or follow-up with your primary care provider if you develop worsening of symptoms, or no improvement despite antibiotics.

## 2023-02-27 NOTE — ED Triage Notes (Signed)
Chief Complaint: sinus, congestion, cough.   Onset: Sunday  Prescriptions or OTC medications tried: Yes- OTC decongestants     with no relief  Sick exposure: No  New foods, medications, or products: No  Recent Travel: No

## 2023-03-10 ENCOUNTER — Other Ambulatory Visit: Payer: Self-pay | Admitting: Nurse Practitioner

## 2023-03-10 DIAGNOSIS — I10 Essential (primary) hypertension: Secondary | ICD-10-CM

## 2023-04-30 IMAGING — DX DG LUMBAR SPINE COMPLETE 4+V
5 series · 5 of 5 positions shown · non-contrast
Comparison: May 11, 2004.

CLINICAL DATA: Pain for 3 days, no reported injury.

EXAM:
LUMBAR SPINE - COMPLETE 4+ VIEW

[l-spine ap]
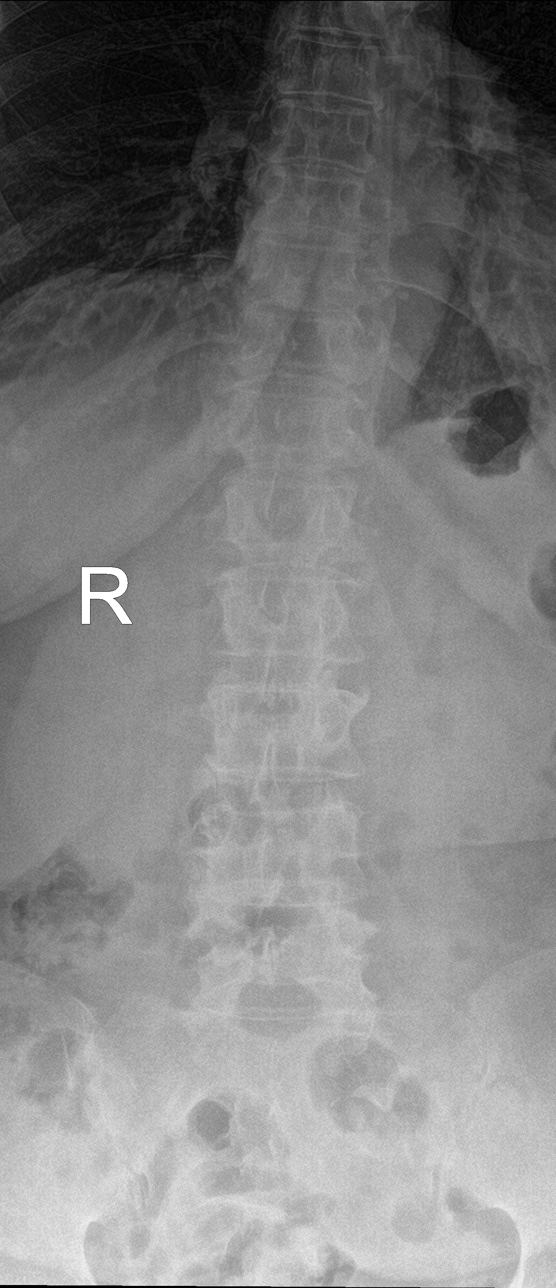

[l-spine obl (1 of 2)]
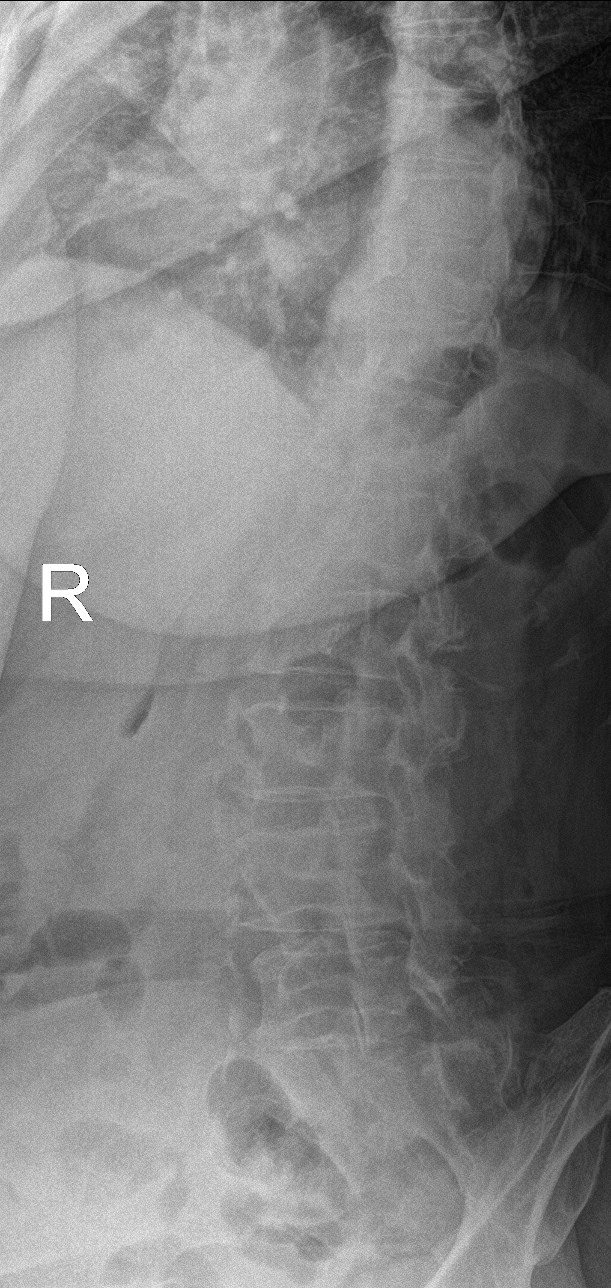

[l-spine obl (2 of 2)]
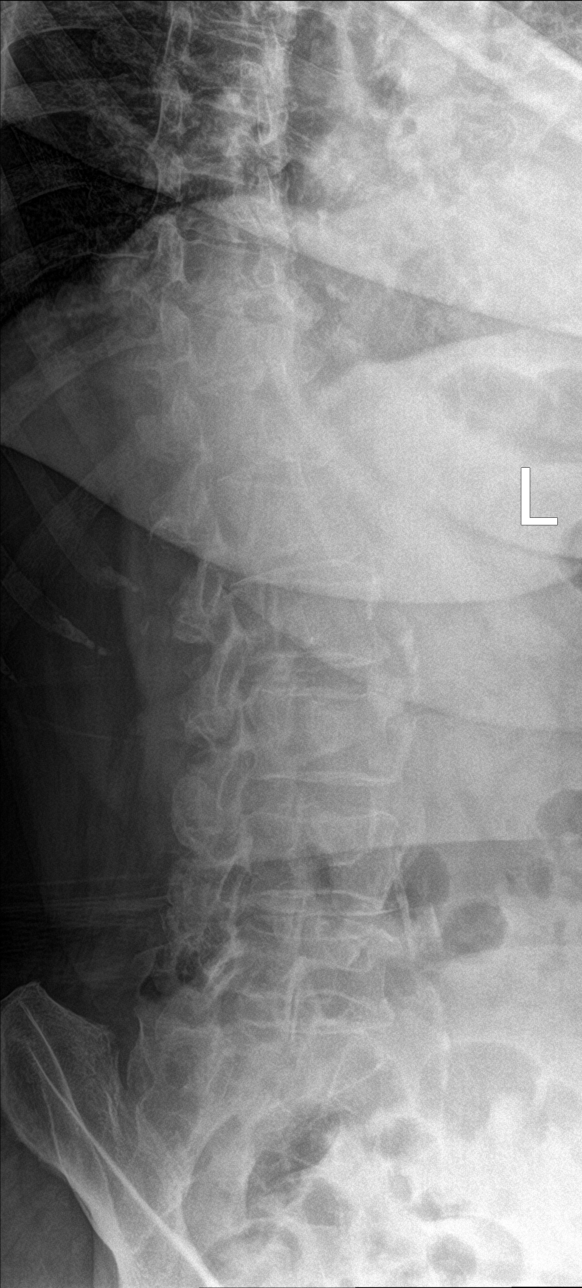

[l-spine lat (1 of 2)]
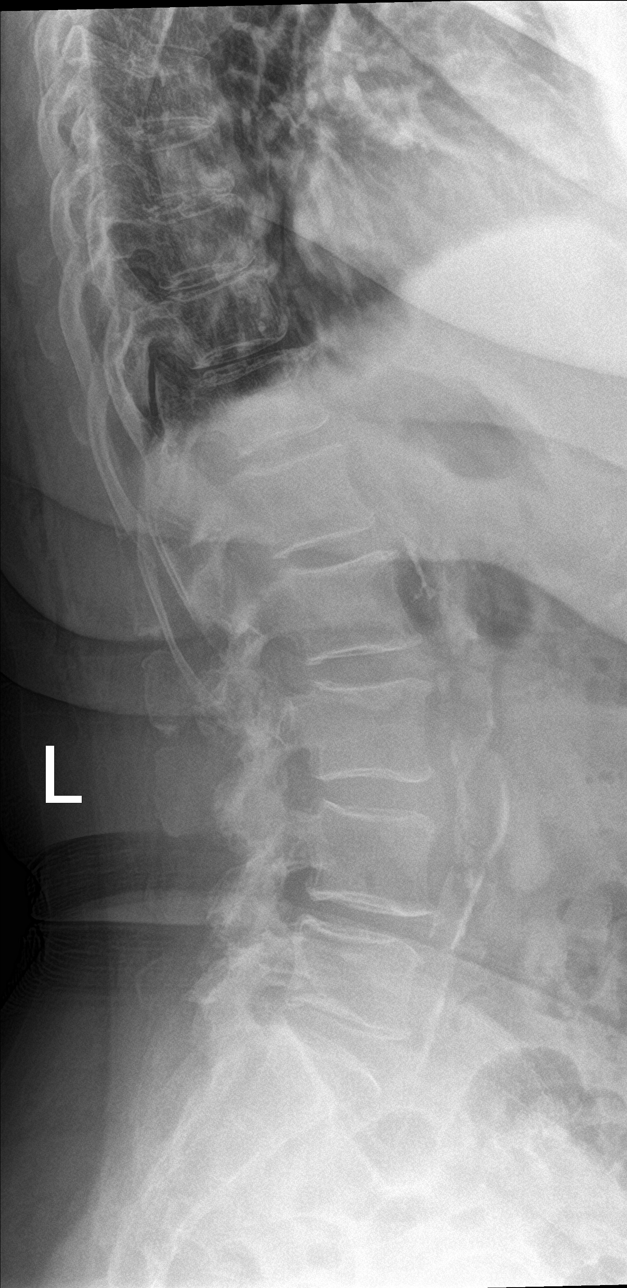

[l-spine lat (2 of 2)]
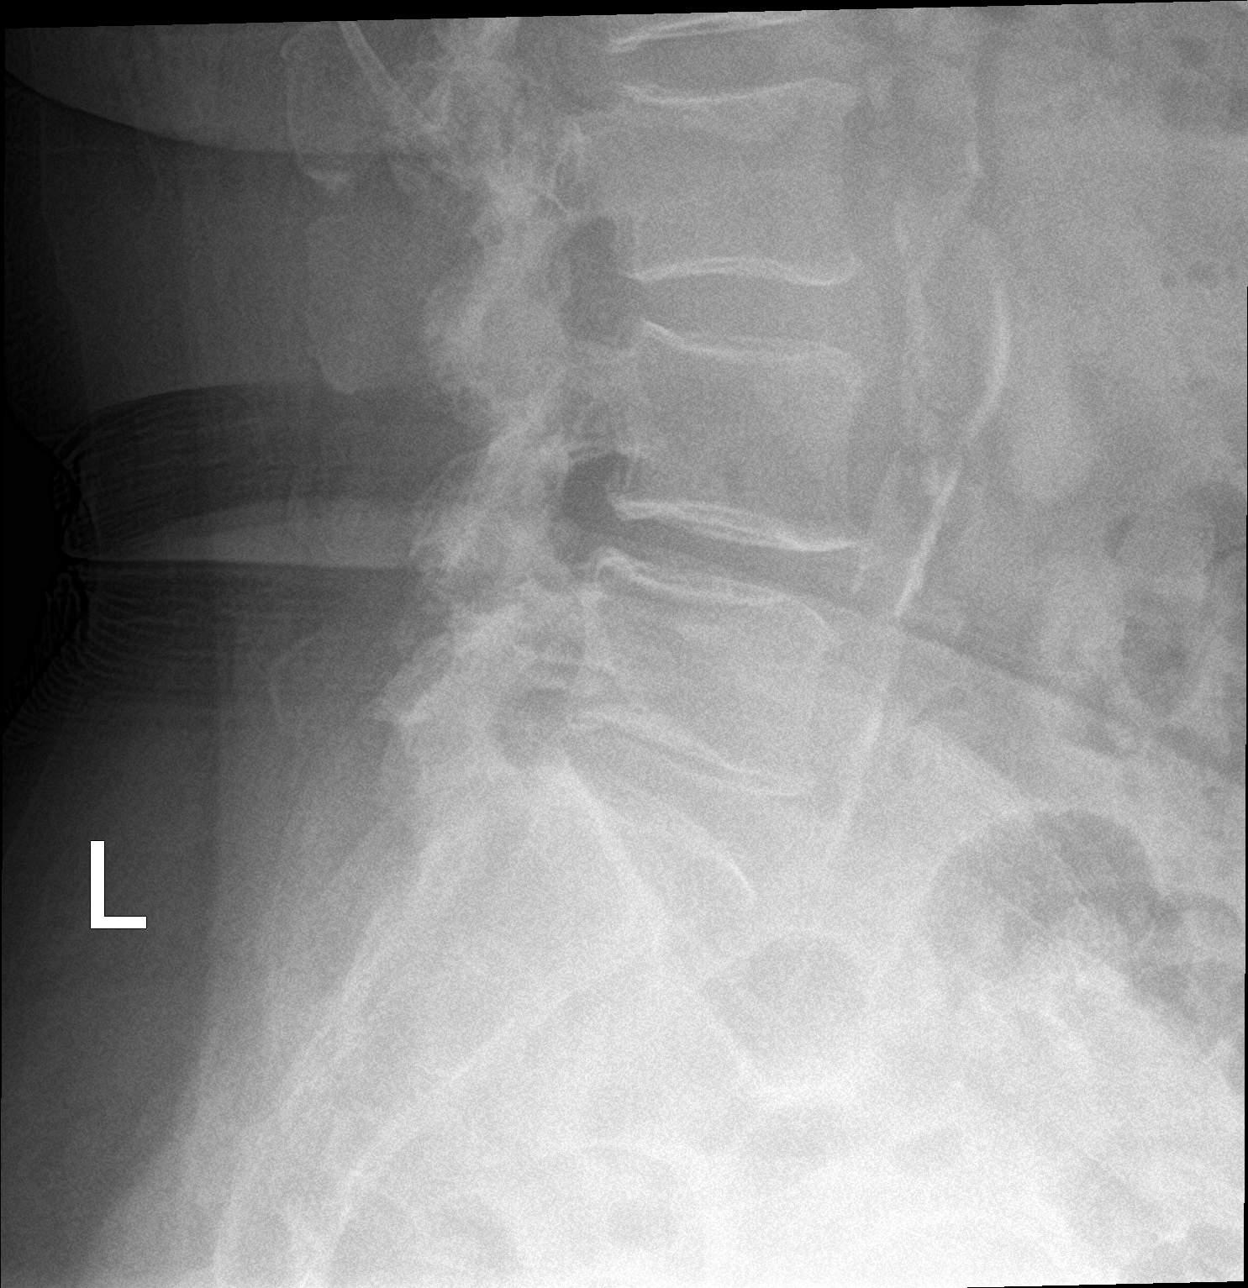

[5 of 5 positions shown; findings below may reference images not displayed]

FINDINGS: Five lumbar type vertebral bodies. Mild disc space narrowing and
anterior osteophyte formation. Alignment is normal. Vertebral body
heights are normal. Facet arthropathy present at L3-4, L4-5 and
L5-S1 is moderate.

No signs of acute abnormality.
IMPRESSION: Degenerative disc disease and facet arthropathy.

## 2023-05-23 ENCOUNTER — Telehealth: Payer: Self-pay

## 2023-05-23 ENCOUNTER — Other Ambulatory Visit: Payer: Self-pay | Admitting: Nurse Practitioner

## 2023-05-23 ENCOUNTER — Other Ambulatory Visit: Payer: Self-pay

## 2023-05-23 DIAGNOSIS — I10 Essential (primary) hypertension: Secondary | ICD-10-CM

## 2023-05-23 NOTE — Telephone Encounter (Signed)
Called pt to advise this will be last refill until she is seen and we need to schedule her for her mammo unless she has had one done already. KH

## 2023-05-26 ENCOUNTER — Telehealth: Payer: Medicare HMO | Admitting: Nurse Practitioner

## 2023-05-26 DIAGNOSIS — M25561 Pain in right knee: Secondary | ICD-10-CM

## 2023-05-26 MED ORDER — CELECOXIB 50 MG PO CAPS
50.0000 mg | ORAL_CAPSULE | Freq: Two times a day (BID) | ORAL | 0 refills | Status: DC
Start: 2023-05-26 — End: 2024-04-07

## 2023-05-26 NOTE — Patient Instructions (Signed)
Teresa Collier, thank you for joining Claiborne Rigg, NP for today's virtual visit.  While this provider is not your primary care provider (PCP), if your PCP is located in our provider database this encounter information will be shared with them immediately following your visit.   A [facility] MyChart account gives you access to today's visit and all your visits, tests, and labs performed at [facility] " click here if you don't have a [facility] MyChart account or go to [email or web address]  Consent: (Patient) Teresa Collier provided verbal consent for this virtual visit at the beginning of the encounter.  Current Medications:  Current Outpatient Medications:    celecoxib (CELEBREX) 50 MG capsule, Take 1 capsule (50 mg total) by mouth 2 (two) times daily. Take with food> can not take with aleve, advil, aspirtin, motrin, ibuprofen or naproxen, Disp: 30 capsule, Rfl: 0   albuterol (VENTOLIN HFA) 108 (90 Base) MCG/ACT inhaler, Inhale 2 puffs into the lungs every 6 (six) hours as needed for wheezing or shortness of breath., Disp: 8 g, Rfl: 12   amoxicillin-clavulanate (AUGMENTIN) 875-125 MG tablet, Take 1 tablet by mouth every 12 (twelve) hours., Disp: 14 tablet, Rfl: 0   cetirizine (ZYRTEC ALLERGY) 10 MG tablet, Take 1 tablet (10 mg total) by mouth daily., Disp: 30 tablet, Rfl: 2   hydrochlorothiazide (HYDRODIURIL) 25 MG tablet, Take 1 tablet (25 mg total) by mouth daily., Disp: 90 tablet, Rfl: 3   NIFEdipine (PROCARDIA XL/NIFEDICAL XL) 60 MG 24 hr tablet, TAKE 1 TABLET(60 MG) BY MOUTH DAILY, Disp: 30 tablet, Rfl: 0   pravastatin (PRAVACHOL) 80 MG tablet, Take 1 tablet (80 mg total) by mouth daily., Disp: 90 tablet, Rfl: 3  Current Facility-Administered Medications:    0.9 %  sodium chloride infusion, 500 mL, Intravenous, Continuous, Armbruster, Willaim Rayas, MD   Medications ordered in this encounter:  Meds ordered this encounter  Medications   celecoxib (CELEBREX) 50 MG  capsule    Sig: Take 1 capsule (50 mg total) by mouth 2 (two) times daily. Take with food> can not take with aleve, advil, aspirtin, motrin, ibuprofen or naproxen    Dispense:  30 capsule    Refill:  0    Order Specific Question:   Supervising Provider    Answer:   Merrilee Jansky [id number]     *If you need refills on other medications prior to your next appointment, please contact your pharmacy*  Follow-Up: Call back or seek an in-person evaluation if the symptoms worsen or if the condition fails to improve as anticipated.  [facility] [phone]  Other Instructions Work on losing weight to help reduce joint pain. May alternate with heat and ice application for pain relief. May also alternate with acetaminophen as prescribed pain relief. Other alternatives include massage, acupuncture and water aerobics.  You must stay active and avoid a sedentary lifestyle.    If you have been instructed to have an in-person evaluation today at a local Urgent Care facility, please use the link below. It will take you to a list of all of our available [facility], including address, phone number and hours of operation. Please do not delay care.  [facility]  If you or a family member do not have a primary care provider, use the link below to schedule a visit and establish care. When you choose a  primary care physician or advanced practice provider, you gain a long-term partner in health. Find  a Primary Care Provider  Learn more about [facility]'s in-office and virtual care options: [place] - Get Care Now

## 2023-05-26 NOTE — Progress Notes (Signed)
Virtual Visit Consent   Teresa Collier, you are scheduled for a virtual visit with a [facility] provider today. Just as with appointments in the office, your consent must be obtained to participate. Your consent will be active for this visit and any virtual visit you may have with one of our providers in the next 365 days. If you have a MyChart account, a copy of this consent can be sent to you electronically.  As this is a virtual visit, video technology does not allow for your provider to perform a traditional examination. This may limit your provider's ability to fully assess your condition. If your provider identifies any concerns that need to be evaluated in person or the need to arrange testing (such as labs, EKG, etc.), we will make arrangements to do so. Although advances in technology are sophisticated, we cannot ensure that it will always work on either your end or our end. If the connection with a video visit is poor, the visit may have to be switched to a telephone visit. With either a video or telephone visit, we are not always able to ensure that we have a secure connection.  By engaging in this virtual visit, you consent to the provision of healthcare and authorize for your insurance to be billed (if applicable) for the services provided during this visit. Depending on your insurance coverage, you may receive a charge related to this service.  I need to obtain your verbal consent now. Are you willing to proceed with your visit today? Teresa Collier has provided verbal consent on 05/26/2023 for a virtual visit (video or telephone). Teresa Rigg, NP  Date: 05/26/2023 1:37 PM  Virtual Visit via Video Note   I, Teresa Collier, connected with  Teresa Collier  ([id number], Dec 04, 1950) on 05/26/23 at  1:30 PM EDT by a video-enabled telemedicine application and verified that I am speaking with the correct person using two identifiers.  Location: Patient: Virtual Visit Location  Patient: Home Provider: Virtual Visit Location Provider: Home Office   I discussed the limitations of evaluation and management by telemedicine and the availability of in person appointments. The patient expressed understanding and agreed to proceed.    History of Present Illness: Teresa Collier is a 72 y.o. who identifies as a female who was assigned female at birth, and is being seen today for right knee pain.   Ms. Teresa Collier notes acute onset of right knee pain. Pain is unrelated to any injury or trauma. Aggravating factors: direct pressure, walking, bending, stooping. Medications tried: ASA, naproxen, tylenol arthritis. Relieving factors: none    Problems:  Patient Active Problem List   Diagnosis Date Noted   Upper respiratory tract infection 11/01/2022   Healthcare maintenance 05/30/2022   Shortness of breath 11/02/2019   Vitamin D deficiency 11/02/2019   Right lower quadrant abdominal pain 09/09/2019   Bilateral hearing loss 03/09/2019   Cataract 03/09/2019   Seasonal allergies 03/09/2019   Essential hypertension 10/16/2016   Hyperlipidemia 10/16/2016   Closed disp fracture of left lateral malleolus with routine healing 08/23/2016   Acute right-sided low back pain without sciatica 05/29/2016   Prediabetes 05/29/2016   Obesity (BMI 30-39.9) 12/05/2015    Allergies: No Known Allergies Medications:  Current Outpatient Medications:    celecoxib (CELEBREX) 50 MG capsule, Take 1 capsule (50 mg total) by mouth 2 (two) times daily. Take with food> can not take with aleve, advil, aspirtin, motrin, ibuprofen or naproxen, Disp: 30 capsule, Rfl: 0   albuterol (VENTOLIN  HFA) 108 (90 Base) MCG/ACT inhaler, Inhale 2 puffs into the lungs every 6 (six) hours as needed for wheezing or shortness of breath., Disp: 8 g, Rfl: 12   amoxicillin-clavulanate (AUGMENTIN) 875-125 MG tablet, Take 1 tablet by mouth every 12 (twelve) hours., Disp: 14 tablet, Rfl: 0   cetirizine (ZYRTEC ALLERGY) 10 MG  tablet, Take 1 tablet (10 mg total) by mouth daily., Disp: 30 tablet, Rfl: 2   hydrochlorothiazide (HYDRODIURIL) 25 MG tablet, Take 1 tablet (25 mg total) by mouth daily., Disp: 90 tablet, Rfl: 3   NIFEdipine (PROCARDIA XL/NIFEDICAL XL) 60 MG 24 hr tablet, TAKE 1 TABLET(60 MG) BY MOUTH DAILY, Disp: 30 tablet, Rfl: 0   pravastatin (PRAVACHOL) 80 MG tablet, Take 1 tablet (80 mg total) by mouth daily., Disp: 90 tablet, Rfl: 3  Current Facility-Administered Medications:    0.9 %  sodium chloride infusion, 500 mL, Intravenous, Continuous, Armbruster, Willaim Rayas, MD  Observations/Objective: Patient is well-developed, well-nourished in no acute distress.  Resting comfortably at home.  Head is normocephalic, atraumatic.  No labored breathing.  Speech is clear and coherent with logical content.  Patient is alert and oriented at baseline.    Assessment and Plan: 1. Acute pain of right knee - celecoxib (CELEBREX) 50 MG capsule; Take 1 capsule (50 mg total) by mouth 2 (two) times daily. Take with food> can not take with aleve, advil, aspirtin, motrin, ibuprofen or naproxen  Dispense: 30 capsule; Refill: 0  Work on losing weight to help reduce joint pain. May alternate with heat and ice application for pain relief. May also alternate with acetaminophen as prescribed pain relief. Other alternatives include massage, acupuncture and water aerobics.  You must stay active and avoid a sedentary lifestyle.   Follow Up Instructions: I discussed the assessment and treatment plan with the patient. The patient was provided an opportunity to ask questions and all were answered. The patient agreed with the plan and demonstrated an understanding of the instructions.  A copy of instructions were sent to the patient via MyChart unless otherwise noted below.    The patient was advised to call back or seek an in-person evaluation if the symptoms worsen or if the condition fails to improve as anticipated.  Time:  I  spent 12 minutes with the patient via telehealth technology discussing the above problems/concerns.    Teresa Rigg, NP

## 2023-05-28 DIAGNOSIS — M25562 Pain in left knee: Secondary | ICD-10-CM | POA: Diagnosis not present

## 2023-06-13 DIAGNOSIS — H903 Sensorineural hearing loss, bilateral: Secondary | ICD-10-CM | POA: Diagnosis not present

## 2023-06-22 ENCOUNTER — Other Ambulatory Visit: Payer: Self-pay | Admitting: Nurse Practitioner

## 2023-06-22 DIAGNOSIS — I10 Essential (primary) hypertension: Secondary | ICD-10-CM

## 2023-06-24 ENCOUNTER — Other Ambulatory Visit: Payer: Self-pay | Admitting: Nurse Practitioner

## 2023-06-24 DIAGNOSIS — I10 Essential (primary) hypertension: Secondary | ICD-10-CM

## 2023-07-11 DIAGNOSIS — H903 Sensorineural hearing loss, bilateral: Secondary | ICD-10-CM | POA: Diagnosis not present

## 2023-07-18 DIAGNOSIS — H903 Sensorineural hearing loss, bilateral: Secondary | ICD-10-CM | POA: Diagnosis not present

## 2023-07-26 ENCOUNTER — Ambulatory Visit (HOSPITAL_COMMUNITY)
Admission: EM | Admit: 2023-07-26 | Discharge: 2023-07-26 | Disposition: A | Discharge status: Home / Self Care | Payer: Medicare HMO | Attending: Internal Medicine | Admitting: Internal Medicine

## 2023-07-26 ENCOUNTER — Encounter (HOSPITAL_COMMUNITY): Payer: Self-pay

## 2023-07-26 ENCOUNTER — Ambulatory Visit (INDEPENDENT_AMBULATORY_CARE_PROVIDER_SITE_OTHER): Payer: Medicare HMO

## 2023-07-26 DIAGNOSIS — G8929 Other chronic pain: Secondary | ICD-10-CM | POA: Diagnosis not present

## 2023-07-26 DIAGNOSIS — M19071 Primary osteoarthritis, right ankle and foot: Secondary | ICD-10-CM | POA: Diagnosis not present

## 2023-07-26 DIAGNOSIS — M79671 Pain in right foot: Secondary | ICD-10-CM

## 2023-07-26 DIAGNOSIS — M79674 Pain in right toe(s): Secondary | ICD-10-CM | POA: Diagnosis not present

## 2023-07-26 MED ORDER — METHYLPREDNISOLONE 4 MG PO TBPK: ORAL_TABLET | ORAL | 0 refills | Status: DC

## 2023-07-26 NOTE — ED Provider Notes (Signed)
[facility]    CSN: [id number] Arrival date & time: 07/26/23  1847      History   Chief Complaint Chief Complaint  Patient presents with   Toe Pain    HPI Teresa Collier is a 72 y.o. female.   Teresa Collier is a 72 y.o. female presenting for chief complaint of pain to the ball of the right foot at the base of the right great toe that is chronic in nature but worsened 2 days ago. Pain does not radiate proximally and is localized to the ventral right foot. Pain worsens with ambulation/weight bearing activity, she is ambulating with everted right foot to avoid placing pressure to the ball of the foot. No recent trauma/injury contributing to pain, no numbness/tingling distally to pain. No history of gout or recent intake of high purine foods. Denies redness, warmth, swelling, and rash over area of tenderness. She has been taking aleve without relief. History of bunionectomy to right foot.    Toe Pain    Past Medical History:  Diagnosis Date   Blood transfusion without reported diagnosis    1973   Cataract    "beginnings"   Hearing loss in right ear    Heart murmur    Hyperlipidemia    Hypertension    Hypokalemia    Muscle spasms of lower extremity 06/2020   Seasonal allergies    Vitamin D deficiency     Patient Active Problem List   Diagnosis Date Noted   Upper respiratory tract infection 11/01/2022   Healthcare maintenance 05/30/2022   Shortness of breath 11/02/2019   Vitamin D deficiency 11/02/2019   Right lower quadrant abdominal pain 09/09/2019   Bilateral hearing loss 03/09/2019   Cataract 03/09/2019   Seasonal allergies 03/09/2019   Essential hypertension 10/16/2016   Hyperlipidemia 10/16/2016   Closed disp fracture of left lateral malleolus with routine healing 08/23/2016   Acute right-sided low back pain without sciatica 05/29/2016   Prediabetes 05/29/2016   Obesity (BMI 30-39.9) 12/05/2015    Past Surgical History:  Procedure  Laterality Date   APPENDECTOMY     BUNIONECTOMY     COLONOSCOPY     OPEN REDUCTION INTERNAL FIXATION (ORIF) FOOT LISFRANC FRACTURE Left 07/25/2016   Procedure: OPEN REDUCTION INTERNAL FIXATION (ORIF) FOOT LISFRANC FRACTURE;  Surgeon: Tarry Kos, MD;  Location: [facility];  Service: Orthopedics;  Laterality: Left;   ORIF ANKLE FRACTURE Left 07/25/2016   Procedure: OPEN REDUCTION INTERNAL FIXATION (ORIF) LEFT BIMALLEOLAR  ANKLE AND LISFRANC FRACTURES;  Surgeon: Tarry Kos, MD;  Location: [facility];  Service: Orthopedics;  Laterality: Left;    OB History     Gravida  6   Para  5   Term  5   Preterm      AB      Living  5      SAB      IAB      Ectopic      Multiple      Live Births               Home Medications    Prior to Admission medications   Medication Sig Start Date End Date Taking? Authorizing Provider  methylPREDNISolone (MEDROL DOSEPAK) 4 MG TBPK tablet Take as prescribed on box. 07/26/23  Yes Rhondalyn Clingan, Donavan Burnet, FNP  albuterol (VENTOLIN HFA) 108 (90 Base) MCG/ACT inhaler Inhale 2 puffs into the lungs every 6 (six) hours as needed for wheezing or shortness  of breath. 07/20/20   Kallie Locks, FNP  celecoxib (CELEBREX) 50 MG capsule Take 1 capsule (50 mg total) by mouth 2 (two) times daily. Take with food> can not take with aleve, advil, aspirtin, motrin, ibuprofen or naproxen 05/26/23   Claiborne Rigg, NP  cetirizine (ZYRTEC ALLERGY) 10 MG tablet Take 1 tablet (10 mg total) by mouth daily. 02/27/23 05/28/23  Garrison, [place] N, FNP  hydrochlorothiazide (HYDRODIURIL) 25 MG tablet TAKE 1 TABLET(25 MG) BY MOUTH DAILY 06/24/23   Ivonne Andrew, NP  NIFEdipine (PROCARDIA XL/NIFEDICAL XL) 60 MG 24 hr tablet TAKE 1 TABLET(60 MG) BY MOUTH DAILY 06/24/23   Ivonne Andrew, NP  pravastatin (PRAVACHOL) 80 MG tablet Take 1 tablet (80 mg total) by mouth daily. 07/04/22 07/04/23  Ivonne Andrew, NP    Family History Family History   Problem Relation Age of Onset   Esophageal cancer Father    Colon cancer Neg Hx    Rectal cancer Neg Hx    Stomach cancer Neg Hx     Social History Social History   Tobacco Use   Smoking status: Former    Current packs/day: 0.00    Average packs/day: 2.0 packs/day for 20.0 years (40.0 ttl pk-yrs)    Types: Cigarettes    Start date: 01/27/1989    Quit date: 01/27/2009    Years since quitting: 14.5   Smokeless tobacco: Never  Vaping Use   Vaping status: Never Used  Substance Use Topics   Alcohol use: Not Currently    Comment: stopped drinking 2010   Drug use: No     Allergies   Patient has no known allergies.   Review of Systems Review of Systems Per HPI  Physical Exam Triage Vital Signs ED Triage Vitals  Encounter Vitals Group     BP 07/26/23 1921 114/75     Systolic BP Percentile --      Diastolic BP Percentile --      Pulse Rate 07/26/23 1921 88     Resp 07/26/23 1921 16     Temp 07/26/23 1921 98 F (36.7 C)     Temp Source 07/26/23 1921 Oral     SpO2 07/26/23 1921 91 %     Weight 07/26/23 1920 175 lb (79.4 kg)     Height 07/26/23 1920 5\' 3"  (1.6 m)     Head Circumference --      Peak Flow --      Pain Score 07/26/23 1919 8     Pain Loc --      Pain Education --      Exclude from Growth Chart --    No data found.  Updated Vital Signs BP 114/75 (BP Location: Left Arm)   Pulse 88   Temp 98 F (36.7 C) (Oral)   Resp 16   Ht 5\' 3"  (1.6 m)   Wt 175 lb (79.4 kg)   SpO2 91%   BMI 31.00 kg/m   Visual Acuity Right Eye Distance:   Left Eye Distance:   Bilateral Distance:    Right Eye Near:   Left Eye Near:    Bilateral Near:     Physical Exam Vitals and nursing note reviewed.  Constitutional:      Appearance: She is not ill-appearing or toxic-appearing.  HENT:     Head: Normocephalic and atraumatic.     Right Ear: Hearing and external ear normal.     Left Ear: Hearing and external ear normal.  Nose: Nose normal.     Mouth/Throat:      Lips: Pink.  Eyes:     General: Lids are normal. Vision grossly intact. Gaze aligned appropriately.     Extraocular Movements: Extraocular movements intact.     Conjunctiva/sclera: Conjunctivae normal.  Cardiovascular:     Pulses:          Dorsalis pedis pulses are 2+ on the right side.       Posterior tibial pulses are 2+ on the right side.  Pulmonary:     Effort: Pulmonary effort is normal.  Musculoskeletal:     Cervical back: Neck supple.     Right foot: Normal range of motion and normal capillary refill. Tenderness (TTP to the medial distal right foot near the base of the first MTP) present. No swelling, deformity, foot drop, laceration, bony tenderness or crepitus. Normal pulse.       Feet:     Comments: Strength and sensation intact to bilateral distal lower extremities/feet.   Feet:     Right foot:     Skin integrity: Skin integrity normal. No ulcer, blister, skin breakdown, erythema, warmth or dry skin.     Toenail Condition: Right toenails are normal.  Skin:    General: Skin is warm and dry.     Capillary Refill: Capillary refill takes less than 2 seconds.     Findings: No rash.  Neurological:     General: No focal deficit present.     Mental Status: She is alert and oriented to person, place, and time. Mental status is at baseline.     Cranial Nerves: No dysarthria or facial asymmetry.  Psychiatric:        Mood and Affect: Mood normal.        Speech: Speech normal.        Behavior: Behavior normal.        Thought Content: Thought content normal.        Judgment: Judgment normal.      UC Treatments / Results  Labs (all labs ordered are listed, but only abnormal results are displayed) Labs Reviewed - No data to display  EKG   Radiology DG Foot Complete Right  Result Date: 07/26/2023 CLINICAL DATA:  Right great toe pain for 2 days. Previous history of bunionectomy. EXAM: RIGHT FOOT COMPLETE - 3+ VIEW COMPARISON:  12/10/2022 FINDINGS: Postoperative changes  consistent with right bunion repair including healed osteotomy and K-wires in the first metatarsal bone. Degenerative changes in the first metatarsal-phalangeal joint as well as in the interphalangeal joints and intertarsal joints. Similar appearance to previous study. No evidence of acute fracture or dislocation. No focal bone lesion or bone destruction. No cortical erosion or sclerosis to suggest evidence of osteomyelitis. Soft tissues are unremarkable. IMPRESSION: 1. Postoperative changes consistent with bunion repair. 2. Moderate degenerative changes in the right foot. 3. No acute bony abnormalities. Electronically Signed   By: Burman Nieves M.D.   On: 07/26/2023 20:14    Procedures Procedures (including critical care time)  Medications Ordered in UC Medications - No data to display  Initial Impression / Assessment and Plan / UC Course  I have reviewed the triage vital signs and the nursing notes.  Pertinent labs & imaging results that were available during my care of the patient were reviewed by me and considered in my medical decision making (see chart for details).   1. Chronic foot pain, right Will treat acute flare of chronic foot pain with steroid as symptoms  have not responded well to NSAIDs to reduce inflammation and pain. Discussed wearing supportive shoes, this may help with pain further. Encouraged follow-up with podiatry in the next 3-5 days.  X-ray of the right foot is negative for acute bony abnormality, however does show chronic arthritic changes to the right foot and bunionectomy.   Counseled patient on potential for adverse effects with medications prescribed/recommended today, strict ER and return-to-clinic precautions discussed, patient verbalized understanding.    Final Clinical Impressions(s) / UC Diagnoses   Final diagnoses:  Chronic foot pain, right     Discharge Instructions      Take prednisone Dosepak as prescribed on box. Take with food to avoid  stomach upset.  Do not take Aleve when taking this.  Please schedule an appointment to follow-up with your foot doctor at [facility] on Monday.  If this medication does not help with your symptoms over the weekend, please return to urgent care for reevaluation.      ED Prescriptions     Medication Sig Dispense Auth. Provider   methylPREDNISolone (MEDROL DOSEPAK) 4 MG TBPK tablet Take as prescribed on box. 1 each Carlisle Beers, FNP      PDMP not reviewed this encounter.   Reita May [place], [place] 07/27/23 [phone]

## 2023-07-26 NOTE — ED Triage Notes (Signed)
Patient here today with c/o right great toe pain X 2 days. Patient has a h/o bunionectomy. She has been taking Aleve with little relief.

## 2023-07-26 NOTE — Discharge Instructions (Addendum)
Take prednisone Dosepak as prescribed on box. Take with food to avoid stomach upset.  Do not take Aleve when taking this.  Please schedule an appointment to follow-up with your foot doctor at [facility] on Monday.  If this medication does not help with your symptoms over the weekend, please return to urgent care for reevaluation.

## 2023-08-09 ENCOUNTER — Ambulatory Visit: Payer: Medicare HMO | Admitting: Podiatry

## 2023-08-13 ENCOUNTER — Other Ambulatory Visit: Payer: Self-pay

## 2023-08-13 MED ORDER — PRAVASTATIN SODIUM 80 MG PO TABS: 80.0000 mg | ORAL_TABLET | Freq: Every day | ORAL | 1 refills | Status: DC

## 2023-08-23 ENCOUNTER — Encounter: Payer: Self-pay | Admitting: Podiatry

## 2023-08-23 ENCOUNTER — Ambulatory Visit (INDEPENDENT_AMBULATORY_CARE_PROVIDER_SITE_OTHER): Payer: Medicare HMO | Admitting: Podiatry

## 2023-08-23 ENCOUNTER — Other Ambulatory Visit: Payer: Self-pay

## 2023-08-23 VITALS — Ht 63.0 in | Wt 175.0 lb

## 2023-08-23 DIAGNOSIS — M76821 Posterior tibial tendinitis, right leg: Secondary | ICD-10-CM | POA: Diagnosis not present

## 2023-08-23 DIAGNOSIS — I10 Essential (primary) hypertension: Secondary | ICD-10-CM

## 2023-08-23 MED ORDER — NIFEDIPINE ER OSMOTIC RELEASE 60 MG PO TB24
60.0000 mg | ORAL_TABLET | Freq: Every day | ORAL | 0 refills | Status: DC
Start: 2023-08-23 — End: 2023-09-02

## 2023-08-23 MED ORDER — TRIAMCINOLONE ACETONIDE 10 MG/ML IJ SUSP
10.0000 mg | Freq: Once | INTRAMUSCULAR | Status: AC
Start: 2023-08-23 — End: 2023-08-23
  Administered 2023-08-23: 10 mg via INTRA_ARTICULAR

## 2023-08-23 NOTE — Progress Notes (Signed)
Subjective:   Patient ID: Teresa Collier, female   DOB: 72 y.o.   MRN: [id number]   HPI Patient presents stating that she developed a lot of pain around the big toe joint and then it migrated to her ankle on the inside and it has been sore and she has tried reducing her activities and wearing support shoes without relief   ROS      Objective:  Physical Exam  Neuro vascular status intact with inflammation pain around the medial ankle posterior tibial tendon with fluid buildup of the tendon and pain with palpation with moderate depression of the arch     Assessment:  Acute posterior tibial tendinitis right probably brought on by gait change     Plan:  H&P condition reviewed and at this point organ to focus on the tendon I did explain eventually may need MRI of the can possibly be an interstitial tear which would require repair.  I went ahead today I did a sterile injection of the sheath of the posterior tib is a comes under the medial malleolus 3 mg dexamethasone Kenalog 5 mg Xylocaine and applied fascial brace to lift up the arch

## 2023-09-02 ENCOUNTER — Other Ambulatory Visit: Payer: Self-pay

## 2023-09-02 DIAGNOSIS — I10 Essential (primary) hypertension: Secondary | ICD-10-CM

## 2023-09-02 MED ORDER — NIFEDIPINE ER OSMOTIC RELEASE 60 MG PO TB24
60.0000 mg | ORAL_TABLET | Freq: Every day | ORAL | 0 refills | Status: DC
Start: 2023-09-02 — End: 2024-03-04

## 2023-09-10 ENCOUNTER — Telehealth: Payer: Self-pay | Admitting: Podiatry

## 2023-09-16 NOTE — Telephone Encounter (Signed)
NA

## 2023-09-17 ENCOUNTER — Telehealth: Payer: Self-pay | Admitting: Podiatry

## 2023-09-17 NOTE — Telephone Encounter (Signed)
Faxed intermittent FMLA paperwork to [facility] for the patient ....  Advised patient same ....     J. Abbott -- 09/17/2023

## 2023-09-19 ENCOUNTER — Telehealth: Payer: Self-pay | Admitting: Podiatry

## 2023-09-19 NOTE — Telephone Encounter (Signed)
Received call from [employer] ....  sd the FMLA docs faxed to [facility] 11/07 were not received - confirmed fax#.   Sent same docs again but via e-mail ([email or web address]) ...      J. Abbott -- 09/19/2023

## 2023-09-20 ENCOUNTER — Encounter: Payer: Self-pay | Admitting: Nurse Practitioner

## 2023-09-20 ENCOUNTER — Ambulatory Visit (INDEPENDENT_AMBULATORY_CARE_PROVIDER_SITE_OTHER): Payer: Medicare HMO | Admitting: Nurse Practitioner

## 2023-09-20 VITALS — BP 162/85 | HR 94 | Temp 98.5°F | Resp 14 | Ht 63.0 in | Wt 175.0 lb

## 2023-09-20 DIAGNOSIS — F4310 Post-traumatic stress disorder, unspecified: Secondary | ICD-10-CM

## 2023-09-20 MED ORDER — HYDROXYZINE HCL 10 MG PO TABS
10.0000 mg | ORAL_TABLET | Freq: Three times a day (TID) | ORAL | 0 refills | Status: DC | PRN
Start: 2023-09-20 — End: 2024-04-07

## 2023-09-20 NOTE — Progress Notes (Signed)
Subjective   Patient ID: Teresa Collier, female    DOB: 11-12-1950, 72 y.o.   MRN: [id number]  Chief Complaint  Patient presents with   Depression    Referring provider: Ivonne Andrew, NP  Teresa Collier is a 72 y.o. female with Past Medical History: No date: Blood transfusion without reported diagnosis     Comment:  1973 No date: Cataract     Comment:  "beginnings" No date: Hearing loss in right ear No date: Heart murmur No date: Hyperlipidemia No date: Hypertension No date: Hypokalemia 06/2020: Muscle spasms of lower extremity No date: Seasonal allergies No date: Vitamin D deficiency   HPI  Patient presents today acute visit.  States that she has been doing recently.  She has been having flashbacks from when she had an abortion many years ago.  She is having a hard time dealing with this.  Will place referral to psychiatry and start patient on hydroxyzine to take as needed. Denies f/c/s, n/v/d, hemoptysis, PND, leg swelling. Denies chest pain or edema.     No Known Allergies  Immunization History  Administered Date(s) Administered   Influenza,inj,Quad PF,6+ Mos 08/27/2016, 12/11/2017, 09/08/2018, 09/09/2019, 07/20/2020   Influenza,inj,quad, With Preservative 10/21/2013   Influenza-Unspecified 10/21/2013, 09/04/2021   Pneumococcal Conjugate-13 09/24/2016   Tdap 10/30/2007, 09/24/2016    Tobacco History: Social History   Tobacco Use  Smoking Status Former   Current packs/day: 0.00   Average packs/day: 2.0 packs/day for 20.0 years (40.0 ttl pk-yrs)   Types: Cigarettes   Start date: 01/27/1989   Quit date: 01/27/2009   Years since quitting: 14.6  Smokeless Tobacco Never   Counseling given: Not Answered   Outpatient Encounter Medications as of 09/20/2023  Medication Sig   albuterol (VENTOLIN HFA) 108 (90 Base) MCG/ACT inhaler Inhale 2 puffs into the lungs every 6 (six) hours as needed for wheezing or shortness of breath.   celecoxib (CELEBREX) 50 MG  capsule Take 1 capsule (50 mg total) by mouth 2 (two) times daily. Take with food> can not take with aleve, advil, aspirtin, motrin, ibuprofen or naproxen   hydrochlorothiazide (HYDRODIURIL) 25 MG tablet TAKE 1 TABLET(25 MG) BY MOUTH DAILY   methylPREDNISolone (MEDROL DOSEPAK) 4 MG TBPK tablet Take as prescribed on box.   NIFEdipine (PROCARDIA XL/NIFEDICAL XL) 60 MG 24 hr tablet Take 1 tablet (60 mg total) by mouth daily.   pravastatin (PRAVACHOL) 80 MG tablet Take 1 tablet (80 mg total) by mouth daily.   cetirizine (ZYRTEC ALLERGY) 10 MG tablet Take 1 tablet (10 mg total) by mouth daily.   Facility-Administered Encounter Medications as of 09/20/2023  Medication   0.9 %  sodium chloride infusion    Review of Systems  Review of Systems  Constitutional: Negative.   HENT: Negative.    Cardiovascular: Negative.   Gastrointestinal: Negative.   Allergic/Immunologic: Negative.   Neurological: Negative.   Psychiatric/Behavioral: Negative.       Objective:   BP (!) 162/85 (BP Location: Right Arm, Patient Position: Sitting, Cuff Size: Normal)   Pulse 94   Temp 98.5 F (36.9 C)   Resp 14   Ht 5\' 3"  (1.6 m)   Wt 175 lb (79.4 kg)   SpO2 99%   BMI 31.00 kg/m   Wt Readings from Last 5 Encounters:  09/20/23 175 lb (79.4 kg)  08/23/23 175 lb (79.4 kg)  07/26/23 175 lb (79.4 kg)  02/27/23 203 lb (92.1 kg)  11/01/22 180 lb (81.6 kg)     Physical Exam  Vitals and nursing note reviewed.  Constitutional:      General: She is not in acute distress.    Appearance: She is well-developed.  Cardiovascular:     Rate and Rhythm: Normal rate and regular rhythm.  Pulmonary:     Effort: Pulmonary effort is normal.     Breath sounds: Normal breath sounds.  Neurological:     Mental Status: She is alert and oriented to person, place, and time.       Assessment & Plan:   PTSD (post-traumatic stress disorder) -     Ambulatory referral to Psychiatry     Return in about 4 weeks (around  10/18/2023) for depression.   Ivonne Andrew, NP 09/20/2023

## 2023-09-20 NOTE — Patient Instructions (Signed)
1. PTSD (post-traumatic stress disorder)  - Ambulatory referral to Psychiatry   Follow up:  Follow up in 1 month

## 2023-09-24 ENCOUNTER — Telehealth: Payer: Self-pay | Admitting: Podiatry

## 2023-09-24 NOTE — Telephone Encounter (Signed)
Sent an e-mail to [employer] (HR Consultant for [employer]) with FMLA paperwork with the intermittent sections completed ....   Same was sent to [facility] 11/19.      J. Abbott -- 09/24/2023

## 2023-09-30 ENCOUNTER — Telehealth: Payer: Self-pay | Admitting: Podiatry

## 2023-09-30 NOTE — Telephone Encounter (Signed)
FMLA Intermittent documents sent 11/26 were incomplete - (section 'A' not filled out).  Completed the paperwork accordingly and e-mailed again over to [place] Reese/HR ....     (J. Abbott -- 09/30/2023)

## 2023-10-17 ENCOUNTER — Ambulatory Visit: Payer: Self-pay | Admitting: Nurse Practitioner

## 2023-10-18 ENCOUNTER — Telehealth (HOSPITAL_COMMUNITY): Payer: Medicare HMO | Admitting: Psychiatry

## 2023-10-18 ENCOUNTER — Encounter (HOSPITAL_COMMUNITY): Payer: Self-pay | Admitting: Psychiatry

## 2023-10-18 VITALS — Wt 175.0 lb

## 2023-10-18 DIAGNOSIS — F4310 Post-traumatic stress disorder, unspecified: Secondary | ICD-10-CM | POA: Diagnosis not present

## 2023-10-18 DIAGNOSIS — F330 Major depressive disorder, recurrent, mild: Secondary | ICD-10-CM | POA: Diagnosis not present

## 2023-10-18 MED ORDER — AMITRIPTYLINE HCL 10 MG PO TABS
10.0000 mg | ORAL_TABLET | Freq: Every day | ORAL | 1 refills | Status: DC
Start: 2023-10-18 — End: 2024-04-07
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
## 2023-11-06 ENCOUNTER — Ambulatory Visit: Payer: Medicare HMO | Admitting: Nurse Practitioner

## 2023-11-12 ENCOUNTER — Encounter (HOSPITAL_COMMUNITY): Payer: Self-pay

## 2023-11-18 ENCOUNTER — Other Ambulatory Visit: Payer: Self-pay | Admitting: Nurse Practitioner

## 2023-11-18 MED ORDER — PREDNISONE 20 MG PO TABS: 20.0000 mg | ORAL_TABLET | Freq: Every day | ORAL | 2 refills | Status: DC

## 2023-11-20 ENCOUNTER — Other Ambulatory Visit: Payer: Self-pay

## 2023-11-20 ENCOUNTER — Other Ambulatory Visit: Payer: Self-pay | Admitting: Nurse Practitioner

## 2023-11-20 MED ORDER — PREDNISONE 20 MG PO TABS
20.0000 mg | ORAL_TABLET | Freq: Every day | ORAL | 2 refills | Status: DC
  Filled 2023-11-20: qty 30, 30d supply, fill #0

## 2023-11-29 ENCOUNTER — Ambulatory Visit (INDEPENDENT_AMBULATORY_CARE_PROVIDER_SITE_OTHER): Payer: Medicare HMO | Admitting: Nurse Practitioner

## 2023-11-29 ENCOUNTER — Encounter: Payer: Self-pay | Admitting: Nurse Practitioner

## 2023-11-29 VITALS — BP 136/58 | HR 74 | Temp 97.1°F | Wt 181.0 lb

## 2023-11-29 DIAGNOSIS — Z1329 Encounter for screening for other suspected endocrine disorder: Secondary | ICD-10-CM

## 2023-11-29 DIAGNOSIS — Z13220 Encounter for screening for lipoid disorders: Secondary | ICD-10-CM

## 2023-11-29 DIAGNOSIS — I10 Essential (primary) hypertension: Secondary | ICD-10-CM | POA: Diagnosis not present

## 2023-11-29 NOTE — Patient Instructions (Addendum)
1. Lipid screening (Primary)  - Lipid Panel  2. Essential hypertension  - CBC - Comprehensive metabolic panel  3. Thyroid disorder screen  - TSH   Follow up:  Follow up in 6 months

## 2023-11-29 NOTE — Progress Notes (Signed)
Subjective   Patient ID: Teresa Collier, female    DOB: Oct 07, 1951, 73 y.o.   MRN: [id number]  Chief Complaint  Patient presents with   Follow-up         Referring provider: Ivonne Andrew, NP  Romey Cohea is a 73 y.o. female with Past Medical History: No date: Blood transfusion without reported diagnosis     Comment:  1973 No date: Cataract     Comment:  "beginnings" No date: Hearing loss in right ear No date: Heart murmur No date: Hyperlipidemia No date: Hypertension No date: Hypokalemia 06/2020: Muscle spasms of lower extremity No date: Seasonal allergies No date: Vitamin D deficiency  HPI  Patient presents today acute visit.  States that she has been doing good recently.  Patient does need blood work today.  No new issues or concerns today.  Denies f/c/s, n/v/d, hemoptysis, PND, leg swelling. Denies chest pain or edema.     No Known Allergies  Immunization History  Administered Date(s) Administered   Influenza,inj,Quad PF,6+ Mos 08/27/2016, 12/11/2017, 09/08/2018, 09/09/2019, 07/20/2020   Influenza,inj,quad, With Preservative 10/21/2013   Influenza-Unspecified 10/21/2013, 09/04/2021, 09/20/2023   Pneumococcal Conjugate-13 09/24/2016   Tdap 10/30/2007, 09/24/2016    Tobacco History: Social History   Tobacco Use  Smoking Status Former   Current packs/day: 0.00   Average packs/day: 2.0 packs/day for 20.0 years (40.0 ttl pk-yrs)   Types: Cigarettes   Start date: 01/27/1989   Quit date: 01/27/2009   Years since quitting: 14.8  Smokeless Tobacco Never   Counseling given: Not Answered   Outpatient Encounter Medications as of 11/29/2023  Medication Sig   amitriptyline (ELAVIL) 10 MG tablet Take 1-2 tablets (10-20 mg total) by mouth at bedtime.   celecoxib (CELEBREX) 50 MG capsule Take 1 capsule (50 mg total) by mouth 2 (two) times daily. Take with food> can not take with aleve, advil, aspirtin, motrin, ibuprofen or naproxen   cetirizine (ZYRTEC  ALLERGY) 10 MG tablet Take 1 tablet (10 mg total) by mouth daily.   hydrochlorothiazide (HYDRODIURIL) 25 MG tablet TAKE 1 TABLET(25 MG) BY MOUTH DAILY   hydrOXYzine (ATARAX) 10 MG tablet Take 1 tablet (10 mg total) by mouth 3 (three) times daily as needed. (Patient not taking: Reported on 10/18/2023)   methylPREDNISolone (MEDROL DOSEPAK) 4 MG TBPK tablet Take as prescribed on box.   NIFEdipine (PROCARDIA XL/NIFEDICAL XL) 60 MG 24 hr tablet Take 1 tablet (60 mg total) by mouth daily.   predniSONE (DELTASONE) 20 MG tablet Take 1 tablet (20 mg total) by mouth daily with breakfast.   predniSONE (DELTASONE) 20 MG tablet Take 1 tablet (20 mg total) by mouth daily with breakfast.   [DISCONTINUED] pravastatin (PRAVACHOL) 80 MG tablet Take 1 tablet (80 mg total) by mouth daily.   Facility-Administered Encounter Medications as of 11/29/2023  Medication   0.9 %  sodium chloride infusion    Review of Systems  Review of Systems  Constitutional: Negative.   HENT: Negative.    Cardiovascular: Negative.   Gastrointestinal: Negative.   Allergic/Immunologic: Negative.   Neurological: Negative.   Psychiatric/Behavioral: Negative.       Objective:   BP (!) 136/58   Pulse 74   Temp (!) 97.1 F (36.2 C)   Wt 181 lb (82.1 kg)   SpO2 94%   BMI 32.06 kg/m   Wt Readings from Last 5 Encounters:  11/29/23 181 lb (82.1 kg)  09/20/23 175 lb (79.4 kg)  08/23/23 175 lb (79.4 kg)  07/26/23 175 lb (  79.4 kg)  02/27/23 203 lb (92.1 kg)     Physical Exam Vitals and nursing note reviewed.  Constitutional:      General: She is not in acute distress.    Appearance: She is well-developed.  Cardiovascular:     Rate and Rhythm: Normal rate and regular rhythm.  Pulmonary:     Effort: Pulmonary effort is normal.     Breath sounds: Normal breath sounds.  Neurological:     Mental Status: She is alert and oriented to person, place, and time.       Assessment & Plan:   Lipid screening -     Lipid  panel  Essential hypertension -     CBC -     Comprehensive metabolic panel  Thyroid disorder screen -     TSH     Return in about 6 months (around 05/28/2024).     Ivonne Andrew, NP 12/16/2023

## 2023-11-30 LAB — COMPREHENSIVE METABOLIC PANEL
ALT: 16 [IU]/L (ref 0–32)
AST: 20 [IU]/L (ref 0–40)
Albumin: 4.1 g/dL (ref 3.9–4.9)
Alkaline Phosphatase: 92 [IU]/L (ref 44–121)
BUN/Creatinine Ratio: 13 (ref 12–28)
BUN: 9 mg/dL (ref 8–27)
Bilirubin Total: 0.3 mg/dL (ref 0.0–1.2)
CO2: 22 mmol/L (ref 20–29)
Calcium: 9.6 mg/dL (ref 8.7–10.3)
Chloride: 100 mmol/L (ref 96–106)
Creatinine, Ser: 0.71 mg/dL (ref 0.57–1.00)
Globulin, Total: 2.6 g/dL (ref 1.5–4.5)
Glucose: 80 mg/dL (ref 70–99)
Potassium: 3.8 mmol/L (ref 3.5–5.2)
Sodium: 141 mmol/L (ref 134–144)
Total Protein: 6.7 g/dL (ref 6.0–8.5)
eGFR: 91 mL/min/{1.73_m2} (ref >59)

## 2023-11-30 LAB — CBC
Hematocrit: 39.1 % (ref 34.0–46.6)
Hemoglobin: 13.1 g/dL (ref 11.1–15.9)
MCH: 31.5 pg (ref 26.6–33.0)
MCHC: 33.5 g/dL (ref 31.5–35.7)
MCV: 94 fL (ref 79–97)
Platelets: 340 10*3/uL (ref 150–450)
RBC: 4.16 x10E6/uL (ref 3.77–5.28)
RDW: 12.4 % (ref 11.7–15.4)
WBC: 10.7 10*3/uL (ref 3.4–10.8)

## 2023-11-30 LAB — LIPID PANEL
Chol/HDL Ratio: 5 {ratio} — ABNORMAL HIGH (ref 0.0–4.4)
Cholesterol, Total: 262 mg/dL — ABNORMAL HIGH (ref 100–199)
HDL: 52 mg/dL (ref >39)
LDL Chol Calc (NIH): 108 mg/dL — ABNORMAL HIGH (ref 0–99)
Triglycerides: 589 mg/dL (ref 0–149)
VLDL Cholesterol Cal: 102 mg/dL — ABNORMAL HIGH (ref 5–40)

## 2023-11-30 LAB — TSH: TSH: 1.52 u[IU]/mL (ref 0.450–4.500)

## 2023-12-01 ENCOUNTER — Other Ambulatory Visit: Payer: Self-pay | Admitting: Nurse Practitioner

## 2023-12-02 ENCOUNTER — Other Ambulatory Visit: Payer: Self-pay | Admitting: Nurse Practitioner

## 2023-12-02 DIAGNOSIS — E785 Hyperlipidemia, unspecified: Secondary | ICD-10-CM

## 2023-12-24 ENCOUNTER — Telehealth: Payer: Self-pay

## 2023-12-24 DIAGNOSIS — Z79899 Other long term (current) drug therapy: Secondary | ICD-10-CM

## 2023-12-24 NOTE — Progress Notes (Signed)
   12/24/2023  Patient ID: Teresa Collier, female   DOB: [birth year], 73 y.o.   MRN: [id number]  Attempted to contact patient for medication management/review. Left HIPAA compliant message for patient to return my call at their convenience.   First attempt for patient outreach. Will follow up with patient in 5-7 business days.  Thank you for allowing pharmacy to be a part of this patient's care.  Cephus Shelling, PharmD Clinical Pharmacist Cell: [phone]

## 2024-01-02 ENCOUNTER — Other Ambulatory Visit: Payer: Self-pay

## 2024-01-02 DIAGNOSIS — Z9189 Other specified personal risk factors, not elsewhere classified: Secondary | ICD-10-CM

## 2024-01-02 NOTE — Progress Notes (Signed)
 01/02/2024 Name: Teresa Collier MRN: [id number] DOB: Jul 10, 1951  No chief complaint on file.   Teresa Collier is a 73 y.o. year old female who presented for a telephone visit.   They were referred to the pharmacist by their PCP for assistance in managing hyperlipidemia.    Subjective:  Care Team: Primary Care Provider: Ivonne Andrew, NP ; Next Scheduled Visit: 05/28/2024   Medication Access/Adherence  Current Pharmacy:  [id number] [place], [place] - [address] AT [facility] OF [facility] [address] [place] [address] Phone: [phone] Fax: [phone]  [facility] #[id number] - [place], [place] - [address] AT [facility] OF [address] & [address] [place] [address] Phone: [phone] Fax: [phone]   Patient reports affordability concerns with their medications: No  Patient reports access/transportation concerns to their pharmacy: No  Patient reports adherence concerns with their medications:  Yes  Claims she doesn't have an issue remembering to take her medications except for pravastatin. She reports that she might forget to take pravastatin a few days out of the week. Pravastatin is taken at night which is why she forgets because she falls asleep. Discussed use of purchasing pillbox since she doesn't use it.    Hyperlipidemia/ASCVD Risk Reduction  Current lipid lowering medications: pravastatin 80 mg once daily Medications tried in the past:    Current physical activity: walks ~ 15 minutes twice daily Current diet: veggies, salad, rare fried foods, she doesn't eat a lot of cheese or red meat.  Denies alcohol use     Objective:  Lab Results  Component Value Date   HGBA1C 5.6 05/05/2021    Lab Results  Component Value Date   CREATININE 0.71 11/29/2023   BUN 9 11/29/2023   NA 141 11/29/2023   K 3.8 11/29/2023   CL 100 11/29/2023   CO2 22 11/29/2023     Lab Results  Component Value Date   CHOL 262 (H) 11/29/2023   HDL 52 11/29/2023   LDLCALC 108 (H) 11/29/2023   TRIG 589 (HH) 11/29/2023   CHOLHDL 5.0 (H) 11/29/2023    Medications Reviewed Today     Reviewed by Katha Cabal, RPH (Pharmacist) on 01/02/24 at 1522  Med List Status: <None>   Medication Order Taking? Sig Documenting Provider Last Dose Status Informant  0.9 %  sodium chloride infusion [id number]   Armbruster, Willaim Rayas, MD  Active   amitriptyline (ELAVIL) 10 MG tablet [id number] No Take 1-2 tablets (10-20 mg total) by mouth at bedtime.  Patient not taking: Reported on 01/02/2024   Cleotis Nipper, MD Not Taking Active            Med Note Para March, [facility]   Thu Jan 02, 2024  3:19 PM) Patient reports Dr. Lolly Mustache told her to stop taking medication and switched to something else.  celecoxib (CELEBREX) 50 MG capsule [id number] No Take 1 capsule (50 mg total) by mouth 2 (two) times daily. Take with food> can not take with aleve, advil, aspirtin, motrin, ibuprofen or naproxen  Patient not taking: Reported on 01/02/2024   Claiborne Rigg, NP Not Taking Active   cetirizine (ZYRTEC ALLERGY) 10 MG tablet [id number]  Take 1 tablet (10 mg total) by mouth daily. Garrison, [place] N, FNP  Expired 05/28/23 2359   hydrochlorothiazide (HYDRODIURIL) 25 MG tablet [id number] Yes TAKE 1 TABLET(25 MG) BY MOUTH DAILY Ivonne Andrew, NP Taking Active  hydrOXYzine (ATARAX) 10 MG tablet [id number] No Take 1 tablet (10 mg total) by mouth 3 (three) times daily as needed.  Patient not taking: Reported on 01/02/2024   Ivonne Andrew, NP Not Taking Active   methylPREDNISolone (MEDROL DOSEPAK) 4 MG TBPK tablet [id number] No Take as prescribed on box.  Patient not taking: Reported on 01/02/2024   Carlisle Beers, FNP Not Taking Active   NIFEdipine (PROCARDIA XL/NIFEDICAL XL) 60 MG 24 hr tablet [id number] Yes Take 1 tablet (60 mg total) by mouth daily. Ivonne Andrew, NP Taking Active   pravastatin  (PRAVACHOL) 80 MG tablet [id number] Yes TAKE 1 TABLET(80 MG) BY MOUTH DAILY Ivonne Andrew, NP Taking Active   predniSONE (DELTASONE) 20 MG tablet [id number] Yes Take 1 tablet (20 mg total) by mouth daily with breakfast. Ivonne Andrew, NP Taking Active   predniSONE (DELTASONE) 20 MG tablet [id number] No Take 1 tablet (20 mg total) by mouth daily with breakfast.  Patient not taking: Reported on 01/02/2024   Ivonne Andrew, NP Not Taking Consider Medication Status and Discontinue (Duplicate)               Assessment/Plan:   Hyperlipidemia/ASCVD Risk Reduction: - Currently uncontrolled. The patient has not been adherent to pravastatin, as indicated by the medication fill history in Dr. Tiajuana Amass and the patient's reported noncompliance related to the timing of medication administration. Additionally, the patient is taking medications that could contribute to elevated triglycerides, such as glucocorticoids and diuretics. However, non-compliance is likely the primary contributing factor. - Reviewed long term complications of uncontrolled cholesterol and medication noncompliance - Reviewed dietary recommendations including to continue current diet.  - Reviewed lifestyle recommendations including to increase walks from 15 to 20 minutes.  - Recommend to purchase a pillbox to help with compliance and switch to rosuvastatin. STOP pravastatin 80 mg once daily.  - May consider switching to rosuvastatin 10 mg daily (this not a 1:1 ratio dose conversion to address TG) to help with compliance of medication time with other medications. Future considerations: May consider fenofibrate to help lower TG levels.  - Patient is okay with coming to the clinic to get fasting lipid to follow after switching to  rosuvastatin in 4-8 weeks.    Follow Up Plan: 2-4 weeks to assess compliance of new statin.   Thank you for allowing pharmacy to be a part of this patient's care. Cephus Shelling, PharmD Clinical  Pharmacist Cell: [phone]

## 2024-01-12 ENCOUNTER — Emergency Department (HOSPITAL_COMMUNITY)
Admission: EM | Admit: 2024-01-12 | Discharge: 2024-01-12 | Disposition: A | Discharge status: Home / Self Care | Attending: Student | Admitting: Student

## 2024-01-12 DIAGNOSIS — M5441 Lumbago with sciatica, right side: Secondary | ICD-10-CM | POA: Diagnosis not present

## 2024-01-12 DIAGNOSIS — M5431 Sciatica, right side: Secondary | ICD-10-CM

## 2024-01-12 DIAGNOSIS — Z79899 Other long term (current) drug therapy: Secondary | ICD-10-CM | POA: Insufficient documentation

## 2024-01-12 DIAGNOSIS — I10 Essential (primary) hypertension: Secondary | ICD-10-CM | POA: Diagnosis not present

## 2024-01-12 DIAGNOSIS — M545 Low back pain, unspecified: Secondary | ICD-10-CM | POA: Diagnosis present

## 2024-01-12 DIAGNOSIS — Z87891 Personal history of nicotine dependence: Secondary | ICD-10-CM | POA: Insufficient documentation

## 2024-01-12 MED ORDER — DEXAMETHASONE SODIUM PHOSPHATE 10 MG/ML IJ SOLN
10.0000 mg | Freq: Once | INTRAMUSCULAR | Status: AC
  Administered 2024-01-12: 10 mg via INTRAMUSCULAR
  Filled 2024-01-12: qty 1

## 2024-01-12 MED ORDER — METHYLPREDNISOLONE 4 MG PO TBPK: ORAL_TABLET | ORAL | 0 refills | Status: DC

## 2024-01-12 MED ORDER — NAPROXEN 375 MG PO TABS: 375.0000 mg | ORAL_TABLET | Freq: Two times a day (BID) | ORAL | 0 refills | Status: DC

## 2024-01-12 MED ORDER — KETOROLAC TROMETHAMINE 15 MG/ML IJ SOLN
15.0000 mg | Freq: Once | INTRAMUSCULAR | Status: AC
  Administered 2024-01-12: 15 mg via INTRAMUSCULAR
  Filled 2024-01-12: qty 1

## 2024-01-12 MED ORDER — LIDOCAINE 5 % EX PTCH: 1.0000 | MEDICATED_PATCH | CUTANEOUS | 0 refills | Status: DC

## 2024-01-12 MED ORDER — LIDOCAINE 5 % EX PTCH: 1.0000 | MEDICATED_PATCH | CUTANEOUS | 0 refills | Status: AC

## 2024-01-12 MED ORDER — LIDOCAINE 5 % EX PTCH
1.0000 | MEDICATED_PATCH | CUTANEOUS | Status: DC
  Administered 2024-01-12: 1 via TRANSDERMAL
  Filled 2024-01-12: qty 1

## 2024-01-12 MED ORDER — NAPROXEN 375 MG PO TABS: 375.0000 mg | ORAL_TABLET | Freq: Two times a day (BID) | ORAL | 0 refills | Status: AC

## 2024-01-12 NOTE — ED Triage Notes (Signed)
 Pt states she began having L lower back pain radiating down her L leg. Pt reports hx of sciatica and pain feels similar. No saddle anesthesia, no bowel/bladder incontinence, no unilateral weakness per pt.

## 2024-01-12 NOTE — ED Provider Notes (Signed)
 [place] EMERGENCY DEPARTMENT AT [facility] Provider Note  CSN: [id number] Arrival date & time: 01/12/24 6295  Chief Complaint(s) Sciatica  HPI Teresa Collier is a 73 y.o. female with PMH sciatica, HTN, HLD who presents emerged apartment for evaluation of back pain.  States that pain began earlier this morning over the left lower leg shooting down to the ankle.  No additional red flag signs of back pain including saddle anesthesia, loss of bowel or bladder, numbness, tingling, weakness.  States that this feels similar to her previous sciatica flare.  No trauma to the back.  Denies chest pain, shortness of breath, abdominal pain, nausea, vomiting or other systemic symptoms.   Past Medical History Past Medical History:  Diagnosis Date   Blood transfusion without reported diagnosis    1973   Cataract    "beginnings"   Hearing loss in right ear    Heart murmur    Hyperlipidemia    Hypertension    Hypokalemia    Muscle spasms of lower extremity 06/2020   Seasonal allergies    Vitamin D deficiency    Patient Active Problem List   Diagnosis Date Noted   Upper respiratory tract infection 11/01/2022   Healthcare maintenance 05/30/2022   Shortness of breath 11/02/2019   Vitamin D deficiency 11/02/2019   Right lower quadrant abdominal pain 09/09/2019   Bilateral hearing loss 03/09/2019   Cataract 03/09/2019   Seasonal allergies 03/09/2019   Essential hypertension 10/16/2016   Hyperlipidemia 10/16/2016   Closed disp fracture of left lateral malleolus with routine healing 08/23/2016   Acute right-sided low back pain without sciatica 05/29/2016   Prediabetes 05/29/2016   Obesity (BMI 30-39.9) 12/05/2015   Home Medication(s) Prior to Admission medications   Medication Sig Start Date End Date Taking? Authorizing Provider  amitriptyline (ELAVIL) 10 MG tablet Take 1-2 tablets (10-20 mg total) by mouth at bedtime. Patient not taking: Reported on 01/02/2024 10/18/23    Arfeen, Phillips Grout, MD  celecoxib (CELEBREX) 50 MG capsule Take 1 capsule (50 mg total) by mouth 2 (two) times daily. Take with food> can not take with aleve, advil, aspirtin, motrin, ibuprofen or naproxen Patient not taking: Reported on 01/02/2024 05/26/23   Claiborne Rigg, NP  cetirizine (ZYRTEC ALLERGY) 10 MG tablet Take 1 tablet (10 mg total) by mouth daily. 02/27/23 05/28/23  Garrison, [place] N, FNP  hydrochlorothiazide (HYDRODIURIL) 25 MG tablet TAKE 1 TABLET(25 MG) BY MOUTH DAILY 06/24/23   Ivonne Andrew, NP  hydrOXYzine (ATARAX) 10 MG tablet Take 1 tablet (10 mg total) by mouth 3 (three) times daily as needed. Patient not taking: Reported on 01/02/2024 09/20/23   Ivonne Andrew, NP  lidocaine (LIDODERM) 5 % Place 1 patch onto the skin daily. Remove & Discard patch within 12 hours or as directed by MD 01/12/24   Zanyia Silbaugh, Wyn Forster, MD  methylPREDNISolone (MEDROL DOSEPAK) 4 MG TBPK tablet Take as prescribed 01/12/24   Oluwatobiloba Martin, MD  naproxen (NAPROSYN) 375 MG tablet Take 1 tablet (375 mg total) by mouth 2 (two) times daily. 01/12/24   Zoye Chandra, MD  NIFEdipine (PROCARDIA XL/NIFEDICAL XL) 60 MG 24 hr tablet Take 1 tablet (60 mg total) by mouth daily. 09/02/23   Ivonne Andrew, NP  pravastatin (PRAVACHOL) 80 MG tablet TAKE 1 TABLET(80 MG) BY MOUTH DAILY 12/03/23   Ivonne Andrew, NP  predniSONE (DELTASONE) 20 MG tablet Take 1 tablet (20 mg total) by mouth daily with breakfast. 11/18/23   Ivonne Andrew, NP  predniSONE (DELTASONE) 20 MG tablet Take 1 tablet (20 mg total) by mouth daily with breakfast. Patient not taking: Reported on 01/02/2024 11/20/23   Ivonne Andrew, NP                                                                                                                                    Past Surgical History Past Surgical History:  Procedure Laterality Date   APPENDECTOMY     BUNIONECTOMY     COLONOSCOPY     OPEN REDUCTION INTERNAL FIXATION (ORIF) FOOT LISFRANC FRACTURE Left  07/25/2016   Procedure: OPEN REDUCTION INTERNAL FIXATION (ORIF) FOOT LISFRANC FRACTURE;  Surgeon: Tarry Kos, MD;  Location: [facility];  Service: Orthopedics;  Laterality: Left;   ORIF ANKLE FRACTURE Left 07/25/2016   Procedure: OPEN REDUCTION INTERNAL FIXATION (ORIF) LEFT BIMALLEOLAR  ANKLE AND LISFRANC FRACTURES;  Surgeon: Tarry Kos, MD;  Location: [facility];  Service: Orthopedics;  Laterality: Left;   Family History Family History  Problem Relation Age of Onset   Esophageal cancer Father    Colon cancer Neg Hx    Rectal cancer Neg Hx    Stomach cancer Neg Hx     Social History Social History   Tobacco Use   Smoking status: Former    Current packs/day: 0.00    Average packs/day: 2.0 packs/day for 20.0 years (40.0 ttl pk-yrs)    Types: Cigarettes    Start date: 01/27/1989    Quit date: 01/27/2009    Years since quitting: 14.9   Smokeless tobacco: Never  Vaping Use   Vaping status: Never Used  Substance Use Topics   Alcohol use: Not Currently    Comment: stopped drinking 2010   Drug use: No   Allergies Patient has no known allergies.  Review of Systems Review of Systems  Musculoskeletal:  Positive for back pain.    Physical Exam Vital Signs  I have reviewed the triage vital signs BP (!) 151/87 (BP Location: Left Arm)   Pulse 95   Temp 98.4 F (36.9 C) (Oral)   Resp 16   SpO2 95%   Physical Exam Vitals and nursing note reviewed.  Constitutional:      General: She is not in acute distress.    Appearance: She is well-developed.  HENT:     Head: Normocephalic and atraumatic.  Eyes:     Conjunctiva/sclera: Conjunctivae normal.  Cardiovascular:     Rate and Rhythm: Normal rate and regular rhythm.     Heart sounds: No murmur heard. Pulmonary:     Effort: Pulmonary effort is normal. No respiratory distress.     Breath sounds: Normal breath sounds.  Abdominal:     Palpations: Abdomen is soft.     Tenderness: There is no  abdominal tenderness.  Musculoskeletal:        General: Tenderness present. No swelling.     Cervical back:  Neck supple.  Skin:    General: Skin is warm and dry.     Capillary Refill: Capillary refill takes less than 2 seconds.  Neurological:     Mental Status: She is alert.     Cranial Nerves: No cranial nerve deficit.     Sensory: No sensory deficit.     Motor: No weakness.  Psychiatric:        Mood and Affect: Mood normal.     ED Results and Treatments Labs (all labs ordered are listed, but only abnormal results are displayed) Labs Reviewed - No data to display                                                                                                                        Radiology No results found.  Pertinent labs & imaging results that were available during my care of the patient were reviewed by me and considered in my medical decision making (see MDM for details).  Medications Ordered in ED Medications  ketorolac (TORADOL) 15 MG/ML injection 15 mg (15 mg Intramuscular Given 01/12/24 0838)  dexamethasone (DECADRON) injection 10 mg (10 mg Intramuscular Given 01/12/24 0102)                                                                                                                                     Procedures Procedures  (including critical care time)  Medical Decision Making / ED Course   This patient presents to the ED for concern of back pain, this involves an extensive number of treatment options, and is a complaint that carries with it a high risk of complications and morbidity.  The differential diagnosis includes muscular strain/spasm, lumbago, disc herniation, spinal fracture, cauda equina, epidural abscess or hematoma, psoas abscess, pyelonephritis  MDM: Patient seen in the emergency room for evaluation of back pain.  Physical exam with positive straight leg test but is otherwise unremarkable.  Neurologic exam is unremarkable.  Patient given Toradol  and a Lidoderm patch as well as Decadron and on reevaluation her symptoms have significant improved.  She is able to walk without difficulty.  Will place outpatient neurosurgical referral for possible local injections.  With no trauma and normal neurologic exam, will defer advanced imaging at this time.  At this time she does not meet inpatient criteria for admission and will be discharged outpatient follow-up.   Additional history obtained:  -External  records from outside source obtained and reviewed including: Chart review including previous notes, labs, imaging, consultation notes   Medicines ordered and prescription drug management: Meds ordered this encounter  Medications   ketorolac (TORADOL) 15 MG/ML injection 15 mg   dexamethasone (DECADRON) injection 10 mg   DISCONTD: lidocaine (LIDODERM) 5 % 1 patch   DISCONTD: lidocaine (LIDODERM) 5 %    Sig: Place 1 patch onto the skin daily. Remove & Discard patch within 12 hours or as directed by MD    Dispense:  30 patch    Refill:  0   DISCONTD: naproxen (NAPROSYN) 375 MG tablet    Sig: Take 1 tablet (375 mg total) by mouth 2 (two) times daily.    Dispense:  20 tablet    Refill:  0   DISCONTD: methylPREDNISolone (MEDROL DOSEPAK) 4 MG TBPK tablet    Sig: Take as prescribed    Dispense:  1 each    Refill:  0   lidocaine (LIDODERM) 5 %    Sig: Place 1 patch onto the skin daily. Remove & Discard patch within 12 hours or as directed by MD    Dispense:  30 patch    Refill:  0   methylPREDNISolone (MEDROL DOSEPAK) 4 MG TBPK tablet    Sig: Take as prescribed    Dispense:  1 each    Refill:  0   naproxen (NAPROSYN) 375 MG tablet    Sig: Take 1 tablet (375 mg total) by mouth 2 (two) times daily.    Dispense:  20 tablet    Refill:  0    -I have reviewed the patients home medicines and have made adjustments as needed  Critical interventions none    Social Determinants of Health:  Factors impacting patients care include:  none   Reevaluation: After the interventions noted above, I reevaluated the patient and found that they have :improved  Co morbidities that complicate the patient evaluation  Past Medical History:  Diagnosis Date   Blood transfusion without reported diagnosis    1973   Cataract    "beginnings"   Hearing loss in right ear    Heart murmur    Hyperlipidemia    Hypertension    Hypokalemia    Muscle spasms of lower extremity 06/2020   Seasonal allergies    Vitamin D deficiency       Dispostion: I considered admission for this patient, but at this time she does not meet inpatient criteria for admission and will be discharged with outpatient follow-up.     Final Clinical Impression(s) / ED Diagnoses Final diagnoses:  Sciatica of right side     @PCDICTATION @    Glendora Score, MD 01/12/24 1918

## 2024-02-01 ENCOUNTER — Other Ambulatory Visit: Payer: Self-pay | Admitting: Nurse Practitioner

## 2024-02-01 DIAGNOSIS — J302 Other seasonal allergic rhinitis: Secondary | ICD-10-CM

## 2024-02-02 ENCOUNTER — Other Ambulatory Visit: Payer: Self-pay | Admitting: Nurse Practitioner

## 2024-02-02 DIAGNOSIS — J302 Other seasonal allergic rhinitis: Secondary | ICD-10-CM

## 2024-02-11 ENCOUNTER — Other Ambulatory Visit: Payer: Self-pay | Admitting: Nurse Practitioner

## 2024-02-11 DIAGNOSIS — Z1231 Encounter for screening mammogram for malignant neoplasm of breast: Secondary | ICD-10-CM

## 2024-02-25 ENCOUNTER — Ambulatory Visit
Admission: RE | Admit: 2024-02-25 | Discharge: 2024-02-25 | Disposition: A | Discharge status: Home / Self Care | Payer: Medicare HMO | Source: Ambulatory Visit | Attending: Nurse Practitioner

## 2024-02-25 DIAGNOSIS — Z1231 Encounter for screening mammogram for malignant neoplasm of breast: Secondary | ICD-10-CM

## 2024-03-03 ENCOUNTER — Other Ambulatory Visit: Payer: Self-pay | Admitting: Nurse Practitioner

## 2024-03-03 DIAGNOSIS — I10 Essential (primary) hypertension: Secondary | ICD-10-CM

## 2024-03-18 ENCOUNTER — Telehealth: Payer: Self-pay | Admitting: Nurse Practitioner

## 2024-03-18 NOTE — Telephone Encounter (Signed)
 Copied from CRM [id number]. Topic: Medical Record Request - Provider/Facility Request >> Mar 18, 2024 11:50 AM Stanly Early wrote: Reason for CRM: [facility] is requesting medical records to be faxed  Patient will callback with fax number

## 2024-03-31 ENCOUNTER — Other Ambulatory Visit: Payer: Self-pay

## 2024-04-01 ENCOUNTER — Telehealth (HOSPITAL_COMMUNITY): Admitting: Psychiatry

## 2024-04-07 ENCOUNTER — Other Ambulatory Visit (INDEPENDENT_AMBULATORY_CARE_PROVIDER_SITE_OTHER)

## 2024-04-07 DIAGNOSIS — I10 Essential (primary) hypertension: Secondary | ICD-10-CM

## 2024-04-07 DIAGNOSIS — E785 Hyperlipidemia, unspecified: Secondary | ICD-10-CM

## 2024-04-07 MED ORDER — BLOOD PRESSURE CUFF MISC
0 refills | Status: DC
Start: 2024-04-07 — End: 2024-05-22

## 2024-04-07 MED ORDER — ROSUVASTATIN CALCIUM 20 MG PO TABS
20.0000 mg | ORAL_TABLET | Freq: Every day | ORAL | 3 refills | Status: AC
Start: 2024-04-07 — End: ?

## 2024-04-07 NOTE — Progress Notes (Signed)
 04/07/2024 Name: Teresa Collier MRN: [id number] DOB: [birth year]  Chief Complaint  Patient presents with   Hypertension   Hyperlipidemia    Teresa Collier is a 73 y.o. year old female who presented for a telephone visit.   They were referred to the pharmacist by their PCP for assistance in managing hyperlipidemia. PMH includes HTN, bilateral hearing loss, HLD, BMI > 30, prediabetes  Subjective: Patient was last engaged by Alexandria Angel, PharmD on 01/02/24. She reported adherence to pravastatin , but revealed missing a few doses per week which was consistent with her fill history. She was advised to stop pravastatin  80 mg daily and switch to rosuvastatin 10 mg daily, but this order was never entered. She was instructed to get a repeat lipid panel in 4-8 weeks, but this was not scheduled. Per fill history, patient last filled pravastatin  80 mg daily on 03/08/24 for a 30ds.   Today, patient reports doing well. She reports that she still forgets to take her pravastatin  a couple times per week. She states she often forgets because she was told she needs to take this medication at night, and she takes all her other medications int he AM.   Care Team: Primary Care Provider: Jerrlyn Morel, NP ; Next Scheduled Visit: 05/28/24 Patient is also seen at the [facility] in [place], [place]  Medication Access/Adherence  Current Pharmacy:  [facility] #[id number] - [place], [place] - [address] AT [facility] OF [facility] & [address] [place] [address] Phone: [phone] Fax: [phone]  [facility] DR[facility]ORE #[id number] - [place], [place] - [address] AT [facility] OF [address] [place] [address] Phone: [phone] Fax: [phone]  [facility] DR[facility] STORE #[id number] [place], [place] - [address] AT [facility] OF [facility] & [address] [place] [address] Phone: [phone] Fax:  [phone]  [facility] - [place], [place] - [address] [place] [address] Phone: [phone] Fax: [phone]   Patient reports affordability concerns with their medications: No  Patient reports access/transportation concerns to their pharmacy: No  Patient reports adherence concerns with their medications:  Yes  - frequently misses PM pravastatin    Hypertension:  Current medications: hydrochlorothiazide  25 mg daily, nifedipine  60 mg daily  Patient has an automated, upper arm home BP cuff - reports that her cuff is old, and she is not sure where it is. Requests to have a new one sent.  Current blood pressure readings readings: not currently checking. Reports BP at [facility] appt recently was 122/58 mmHg  Patient denies hypotensive s/sx including dizziness, lightheadedness.  Patient denies hypertensive symptoms including headache, chest pain, shortness of breath  Current meal patterns: veggies, salad, rare fried foods, she doesn't eat a lot of cheese or red meat.  Denies intake of processed sugary foods. Reports sweet food, like honeybun once a month Drinks: very occasional soda - infrequently, reports glass of wine 3-4 times per week  Current physical activity: walks ~ 15 minutes three times per day    Hyperlipidemia/ASCVD Risk Reduction  Current lipid lowering medications: pravastatin  80 mg daily (frequently misses doses)  Antiplatelet regimen: N/A  ASCVD History:  Family History: Uncle that died of stroke  Risk Factors: former smoker (quit 2010), HTN  Clinical ASCVD: No  The 10-year ASCVD risk score (Arnett DK, et al., 2019) is: 20.2%   Values used to calculate  the score:     Age: 34 years     Sex: Female     Is Non-Hispanic African American: Yes     Diabetic: No     Tobacco smoker: No     Systolic Blood Pressure: 151 mmHg     Is BP treated: Yes     HDL Cholesterol: 52 mg/dL     Total Cholesterol: 262 mg/dL     Objective:  BP at [facility] appt on 03/25/24 - 122/58 mmHg,HR 96  BP Readings from Last 3 Encounters:  01/12/24 (!) 151/87  11/29/23 (!) 136/58  09/20/23 (!) 162/85    Lab Results  Component Value Date   HGBA1C 5.6 05/05/2021    Lab Results  Component Value Date   CREATININE 0.71 11/29/2023   BUN 9 11/29/2023   NA 141 11/29/2023   K 3.8 11/29/2023   CL 100 11/29/2023   CO2 22 11/29/2023    Lab Results  Component Value Date   CHOL 262 (H) 11/29/2023   HDL 52 11/29/2023   LDLCALC 108 (H) 11/29/2023   TRIG 589 (HH) 11/29/2023   CHOLHDL 5.0 (H) 11/29/2023   *lipid panel was drawn ~2PM and was likely non-fasting. TG in prior years was 200-300 mg/dL.    Medications Reviewed Today     Reviewed by Adra Alanis, RPH (Pharmacist) on 04/07/24 at 1507  Med List Status: <None>   Medication Order Taking? Sig Documenting Provider Last Dose Status Informant  0.9 %  sodium chloride  infusion [id number]   Armbruster, Lendon Queen, MD  Active   Blood Pressure Monitoring (BLOOD PRESSURE CUFF) MISC [id number] Yes Use as directed to monitor blood pressure once daily. Jerrlyn Morel, NP  Active   cetirizine  (ZYRTEC  ALLERGY) 10 MG tablet [id number]  Take 1 tablet (10 mg total) by mouth daily. Harlow Lighter, [place]  N, FNP  Expired 05/28/23 2359   hydrochlorothiazide  (HYDRODIURIL ) 25 MG tablet [id number] Yes TAKE 1 TABLET(25 MG) BY MOUTH DAILY Jerrlyn Morel, NP Taking Active   lidocaine  (LIDODERM ) 5 % [id number]  Place 1 patch onto the skin daily. Remove & Discard patch within 12 hours or as directed by MD Karlyn Overman, MD  Active     Discontinued 04/07/24 1507 (Duplicate)   loratadine  (CLARITIN ) 10 MG tablet [id number]  TAKE 1 TABLET(10 MG) BY MOUTH DAILY Nichols, Tonya S, NP  Active     Discontinued 04/07/24 1507 (Completed Course)   naproxen  (NAPROSYN ) 375 MG tablet [id number]  Take 1 tablet (375 mg total) by mouth 2 (two) times daily. Kommor, Madison, MD  Active   NIFEdipine  (PROCARDIA  XL/NIFEDICAL  XL) 60 MG 24 hr tablet [id number] Yes TAKE 1 TABLET(60 MG) BY MOUTH DAILY Nichols, Tonya S, NP Taking Active   predniSONE  (DELTASONE ) 20 MG tablet [id number]  Take 1 tablet (20 mg total) by mouth daily with breakfast.  Patient not taking: Reported on 01/02/2024   Nichols, Tonya S, NP  Active   rosuvastatin (CRESTOR) 20 MG tablet [id number] Yes Take 1 tablet (20 mg total) by mouth daily. Paseda, Folashade R, FNP  Active               Assessment/Plan:   Hypertension: - Currently uncontrolled with last clinic BP above goal < 130/80 mmHg. Patient reports BP was below goal at recent [place] appt, therefore, I am hesitant to escalate pharmacotherapy today. Instructed patient to monitor once daily and record readings.  - Reviewed long term cardiovascular and renal outcomes of uncontrolled blood pressure - Reviewed appropriate blood  pressure monitoring technique and reviewed goal blood pressure. Recommended to check home blood pressure and heart rate once daily and keep a log.  - Recommend to continue hydrochlorothiazide  25 mg daily, nifedipine  60 mg daily - Provided prescription for BP cuff per protocol for mail order from [facility]    Hyperlipidemia/ASCVD Risk Reduction: - Currently uncontrolled with last LDL-C of 108 mg/dL above goal < 70 mg/dL given ASCVD risk > 16%. Last TG > 500 mg/dL which puts patient at risk for pancreatitis, however, suspect that these were non-fasting labs as they were drawn in the afternoon. Discussed foods and drinks that influence triglycerides. Provided education on s/sx of pancreatitis. I suspect that her TG would be < 500 mg/dL if she was fasting, therefore I recommend to maximize statin today and consider adding fibrate if TG are still > 500 mg/dL on recheck.  - Reviewed long term complications of uncontrolled cholesterol - Reviewed dietary recommendations including avoiding alcohol, sugary beverages, processed foods.  - Reviewed lifestyle recommendations including  increasing physical activity.  - Recommend to stop pravastatin . Recommend to start rosuvastatin 20 mg daily. Will collaborate with provider to place orders.  - Consider addition of fenofibrate if TG remain > 500 mg/dL on recheck - Repeat lipid panel + direct LDL at next PCP appt 05/28/24.  Follow Up Plan: Pharmacist telephone 05/19/24, PCP 05/28/24  Arthea Larsson, PharmD PGY1 Pharmacy Resident

## 2024-04-23 ENCOUNTER — Ambulatory Visit: Admitting: Podiatry

## 2024-04-23 ENCOUNTER — Ambulatory Visit (INDEPENDENT_AMBULATORY_CARE_PROVIDER_SITE_OTHER)

## 2024-04-23 ENCOUNTER — Encounter: Payer: Self-pay | Admitting: Podiatry

## 2024-04-23 DIAGNOSIS — M76821 Posterior tibial tendinitis, right leg: Secondary | ICD-10-CM

## 2024-04-23 DIAGNOSIS — M76822 Posterior tibial tendinitis, left leg: Secondary | ICD-10-CM

## 2024-04-23 DIAGNOSIS — M7752 Other enthesopathy of left foot: Secondary | ICD-10-CM | POA: Diagnosis not present

## 2024-04-23 DIAGNOSIS — M7751 Other enthesopathy of right foot: Secondary | ICD-10-CM | POA: Diagnosis not present

## 2024-04-23 MED ORDER — TRIAMCINOLONE ACETONIDE 10 MG/ML IJ SUSP
10.0000 mg | Freq: Once | INTRAMUSCULAR | Status: AC
Start: 2024-04-23 — End: 2024-04-23
  Administered 2024-04-23: 10 mg via INTRA_ARTICULAR

## 2024-04-24 NOTE — Progress Notes (Signed)
 Subjective:   Patient ID: [place], female   DOB: 73 y.o.   MRN: [id number]   HPI Patient's not been seen for around 8 months states she was doing excellent until a couple weeks ago and started to develop discomfort again in the medial side of the ankle bilateral and it has been slowly   ROS      Objective:  Physical Exam  Inflammation noted in the posterior tibial tendon as it comes under the medial malleolus bilateral with fluid buildup no indication tendon dysfunction     Assessment:  Posterior tibial tendinitis bilateral with no indication of pathology as far as function     Plan:  H&P reviewed that it has been 8 months I recommended injections which do extremely well for her at her age and I did explain risk of these.  She wants to do this sterile prep injected the sheath of the posterior tib right and left as it comes under the medial malleolus 3 mg Dexasone Kenalog  5 mg Xylocaine  and applied sterile dressings  Precaution your x-rays do today due to the increased and discomfort do indicate depression of the arch bilateral when compared to previous views not a significant difference and no change in arthritis or other pathology

## 2024-04-27 ENCOUNTER — Telehealth (HOSPITAL_COMMUNITY): Admitting: Psychiatry

## 2024-04-30 ENCOUNTER — Telehealth: Payer: Self-pay | Admitting: Podiatry

## 2024-04-30 NOTE — Telephone Encounter (Signed)
 Lft mess for pt to call me back

## 2024-05-04 ENCOUNTER — Telehealth: Payer: Self-pay | Admitting: Podiatry

## 2024-05-04 DIAGNOSIS — Z0271 Encounter for disability determination: Secondary | ICD-10-CM

## 2024-05-04 NOTE — Telephone Encounter (Signed)
 Pt lft mess on my vmail dates for [employer] form should be 04/22/24-08/22/24. I revised and faxed to [phone]

## 2024-05-06 ENCOUNTER — Telehealth: Payer: Self-pay | Admitting: Podiatry

## 2024-05-06 NOTE — Telephone Encounter (Signed)
 Recd [employer] forms back. Refaxed back with notes to [phone].

## 2024-05-07 ENCOUNTER — Telehealth: Payer: Self-pay | Admitting: Podiatry

## 2024-05-07 NOTE — Telephone Encounter (Signed)
 pt lft mess on my vmail. cld back and her vmail if full so I could not leave a mess for her. if she calls in....her forms were faxed on 05/04/24 to [employer]

## 2024-05-11 ENCOUNTER — Telehealth: Payer: Self-pay | Admitting: Podiatry

## 2024-05-11 NOTE — Telephone Encounter (Signed)
 Refaxed [employer] [phone] with update of appts/freq per their request.

## 2024-05-18 ENCOUNTER — Other Ambulatory Visit: Payer: Self-pay

## 2024-05-18 NOTE — Progress Notes (Signed)
 Attempted to contact patient for scheduled appointment for medication management x2. Left HIPAA compliant message for patient to return my call at their convenience.   Lorain Baseman, PharmD [facility] [phone]

## 2024-05-19 ENCOUNTER — Telehealth: Payer: Self-pay | Admitting: Podiatry

## 2024-05-19 NOTE — Telephone Encounter (Signed)
 pt lft mess on my vmail. I called and lft mess on her vmail that I faxed [employer] the forms and filled out what was missing(freq,etc).

## 2024-05-20 ENCOUNTER — Other Ambulatory Visit: Payer: Self-pay | Admitting: Nurse Practitioner

## 2024-05-20 DIAGNOSIS — I10 Essential (primary) hypertension: Secondary | ICD-10-CM

## 2024-05-22 ENCOUNTER — Other Ambulatory Visit: Payer: Self-pay

## 2024-05-22 DIAGNOSIS — I10 Essential (primary) hypertension: Secondary | ICD-10-CM

## 2024-05-22 MED ORDER — HYDROCHLOROTHIAZIDE 25 MG PO TABS: 25.0000 mg | ORAL_TABLET | Freq: Every day | ORAL | 3 refills | Status: AC

## 2024-05-22 NOTE — Progress Notes (Signed)
 05/22/2024 Name: Teresa Collier MRN: [id number] DOB: 1951-02-02  Chief Complaint  Patient presents with   Hypertension   Hyperlipidemia    Teresa Collier is a 73 y.o. year old female who presented for a telephone visit.   They were referred to the pharmacist by their PCP for assistance in managing hyperlipidemia. PMH includes HTN, bilateral hearing loss, HLD, BMI > 30, prediabetes  Subjective: Patient was last engaged by pharmacy on 04/07/24. She reported frequently missing doses of pravastatin  due to being told she needs to take it in the evening. She was transitioned to rosuvastatin  20 mg daily. We also discussed her HTN. She reported her BP at her recent [place] appt was 122/58, so we did not make any medication adjustments.   Today, patient reports doing ok. Her son was in a car accident this week, so it has been stressful.   Care Team: Primary Care Provider: [name], NP ; Next Scheduled Visit: 05/28/24 - confirms she will be attending Patient is also seen at the [facility] in [place], [place]  Medication Access/Adherence  Current Pharmacy:  [facility] #[id number] - [name], [place] - [address] AT [facility] OF [facility] & [address] [place] [address] Phone: [phone] Fax: [phone]  [facility] DR[facility] STORE #[id number] - [name], [name] - [address] AT [facility] OF [address] & [address] [name] [address] Phone: [phone] Fax: [phone]  [facility] DRUG STORE #[id number] [name], [place] - [address] AT [facility] OF [facility] & [address] [place] [address] Phone: [phone] Fax: [phone]  [facility] - [place], [place] - [address] [place] [address] Phone: [phone] Fax: [phone]  [facility] STORE #[id number] - [address], [place] - [address] AT [facility] OF [address] [place] [address] Phone: [phone] Fax: [phone]   Patient reports affordability concerns with their medications: No  Patient reports access/transportation concerns to their pharmacy: No  Patient reports adherence concerns with their medications:  Yes  - frequently misses PM pravastatin    Hypertension:  Current medications: hydrochlorothiazide  25 mg daily, nifedipine  60 mg daily  Patient has an automated, upper arm home BP cuff - reports that her cuff is old, and she is not sure where it is. Did not receive BP cuff in the mail from [employer] (they are not able to bill to Medicare). She states she cannot afford a cuff.   Current blood pressure readings readings: not currently checking. Reports BP at [facility] appt recently was 122/58 mmHg. Has not had BP rechecked since our last visit.  Patient denies hypotensive s/sx including dizziness, lightheadedness.  Patient denies hypertensive symptoms including headache, chest pain, shortness of breath  Current meal patterns: veggies, salad, rare fried foods, she doesn't eat a lot of cheese or red meat.  Denies intake of processed sugary foods. Reports sweet food, like honeybun once a month Drinks: very occasional soda - infrequently, initially reported glass of wine 3-4 times per week (today reports she has decreased slightly)  Current physical activity: walks ~ 15 minutes daily or twice daily depending on the heat   Hyperlipidemia/ASCVD Risk Reduction  Current lipid lowering medications: rosuvastatin  20 mg daily (taking in the AM with her other medications)  Antiplatelet regimen: N/A  ASCVD History:  Family History: Uncle that died of stroke  Risk Factors: former smoker (  quit 2010), HTN  Clinical ASCVD: No  The 10-year ASCVD risk score (Arnett DK, et al., 2019) is: 20.2%   Values used to calculate the score:     Age: 47 years     Clincally relevant sex: Female     Is Non-Hispanic African American: Yes     Diabetic:  No     Tobacco smoker: No     Systolic Blood Pressure: 151 mmHg     Is BP treated: Yes     HDL Cholesterol: 52 mg/dL     Total Cholesterol: 262 mg/dL    Objective:  BP at [facility] appt on 03/25/24 - 122/58 mmHg,HR 96  BP Readings from Last 3 Encounters:  01/12/24 (!) 151/87  11/29/23 (!) 136/58  09/20/23 (!) 162/85    Lab Results  Component Value Date   HGBA1C 5.6 05/05/2021    Lab Results  Component Value Date   CREATININE 0.71 11/29/2023   BUN 9 11/29/2023   NA 141 11/29/2023   K 3.8 11/29/2023   CL 100 11/29/2023   CO2 22 11/29/2023    Lab Results  Component Value Date   CHOL 262 (H) 11/29/2023   HDL 52 11/29/2023   LDLCALC 108 (H) 11/29/2023   TRIG 589 (HH) 11/29/2023   CHOLHDL 5.0 (H) 11/29/2023   *lipid panel was drawn ~2PM and was likely non-fasting. TG in prior years was 200-300 mg/dL.    Medications Reviewed Today     Reviewed by Brinda Lorain SQUIBB, RPH (Pharmacist) on 05/22/24 at 1027  Med List Status: <None>   Medication Order Taking? Sig Documenting Provider Last Dose Status Informant  0.9 %  sodium chloride  infusion [id number]   Armbruster, Elspeth SQUIBB, MD  Active     Discontinued 05/22/24 1027 (Cost of medication)   cetirizine  (ZYRTEC  ALLERGY) 10 MG tablet [id number]  Take 1 tablet (10 mg total) by mouth daily. Dreama, [place]  N, FNP  Expired 05/28/23 2359   hydrochlorothiazide  (HYDRODIURIL ) 25 MG tablet [id number]  TAKE 1 TABLET(25 MG) BY MOUTH DAILY Nichols, Tonya S, NP  Active   lidocaine  (LIDODERM ) 5 % [id number]  Place 1 patch onto the skin daily. Remove & Discard patch within 12 hours or as directed by MD Kommor, Lum, MD  Active   loratadine  (CLARITIN ) 10 MG tablet [id number]  TAKE 1 TABLET(10 MG) BY MOUTH DAILY Nichols, Tonya S, NP  Active   naproxen  (NAPROSYN ) 375 MG tablet [id number]  Take 1 tablet (375 mg total) by mouth 2 (two) times daily. Kommor, Madison, MD  Active   NIFEdipine  (PROCARDIA  XL/NIFEDICAL XL) 60 MG 24 hr tablet [id number] Yes TAKE 1  TABLET(60 MG) BY MOUTH DAILY Nichols, Tonya S, NP  Active    Patient not taking:   Discontinued 05/22/24 1027 (Completed Course)   rosuvastatin  (CRESTOR ) 20 MG tablet [id number] Yes Take 1 tablet (20 mg total) by mouth daily. Paseda, Folashade R, FNP  Active               Assessment/Plan:   Hypertension: - Currently uncontrolled with last clinic BP above goal < 130/80 mmHg. Patient reported BP was below goal at recent [facility] appt but has not been able to recheck BP since. Will provide patient with home monitor and reevaluate for escalation in therapy. - Reviewed long term cardiovascular and renal outcomes of uncontrolled blood pressure - Reviewed appropriate blood pressure monitoring technique and reviewed goal blood pressure. Recommended to check home blood pressure and heart rate once daily and keep a log once  she obtains a BP cuff - Recommend to continue hydrochlorothiazide  25 mg daily, nifedipine  60 mg daily - Will collaborate with CMA at [facility] to provide patient BP cuff at next appointment  The patient has a diagnosis of hypertension and it is medically necessary for them to have access to a home device to monitor blood pressure. The patient does not have readily available insurance access to a device and cannot afford to purchase a device at this time. The patient has been counseled that they do not need to continue to receive services from [facility] for receive a device. The patient will be given a device free of charge at next PCP appointment.  Hyperlipidemia/ASCVD Risk Reduction: - Currently uncontrolled with last LDL-C of 108 mg/dL above goal < 70 mg/dL given ASCVD risk > 79%. Last TG > 500 mg/dL which puts patient at risk for pancreatitis, however, suspect that these were non-fasting labs as they were drawn in the afternoon. Discussed foods and drinks that influence triglycerides. Provided education on s/sx of pancreatitis. I suspect that her TG would be < 500 mg/dL if she was fasting.  She is tolerating high-intensity statin well. Will consider adding fibrate if TG are still > 500 mg/dL on recheck.  - Reviewed long term complications of uncontrolled cholesterol - Reviewed dietary recommendations including avoiding alcohol, sugary beverages, processed foods.  - Reviewed lifestyle recommendations including increasing physical activity.  - Recommend to continue rosuvastatin  20 mg daily.  - Consider addition of fenofibrate if TG remain > 500 mg/dL on recheck - Repeat fasting lipid panel + direct LDL at next PCP appt 05/28/24.  Follow Up Plan:  PCP 05/28/24, Pharmacist telephone 06/26/24  Lorain Baseman, PharmD [facility] [phone]

## 2024-05-25 ENCOUNTER — Encounter (HOSPITAL_COMMUNITY): Payer: Self-pay | Admitting: Psychiatry

## 2024-05-25 ENCOUNTER — Telehealth (HOSPITAL_COMMUNITY): Admitting: Psychiatry

## 2024-05-25 VITALS — Wt 181.0 lb

## 2024-05-25 DIAGNOSIS — F330 Major depressive disorder, recurrent, mild: Secondary | ICD-10-CM

## 2024-05-25 DIAGNOSIS — F4310 Post-traumatic stress disorder, unspecified: Secondary | ICD-10-CM | POA: Diagnosis not present

## 2024-05-25 MED ORDER — AMITRIPTYLINE HCL 10 MG PO TABS: 20.0000 mg | ORAL_TABLET | Freq: Every day | ORAL | 1 refills | Status: AC

## 2024-05-25 NOTE — Progress Notes (Signed)
 [facility] MD Virtual Progress Note   Patient Location: Home Provider Location: Home Office  I connect with patient by video and verified that I am speaking with correct person by using two identifiers. I discussed the limitations of evaluation and management by telemedicine and the availability of in person appointments. I also discussed with the patient that there may be a patient responsible charge related to this service. The patient expressed understanding and agreed to proceed.  Teresa Collier [id number] 73 y.o.  05/25/2024 9:09 AM  History of Present Illness:  Patient is 73 year old African-American married female with a history of PTSD and major depressive disorder and remote history of alcohol and DUI.  She was initially seen last year and she missed her appointment and rescheduled was an issue.  She admitted not taking the amitriptyline  every night.  She reported when take the medicine it helps her sleep, anger, irritability, nightmares.  When asked about why not taking regularly she reported sometimes it makes funny when she takes in the morning.  Patient is married and lives with her husband.  Patient told last month one of her son got into a motor vehicle accident who lives in casual [place] but doing better now.  Patient involved in church activities.  She reported nightmares and flashbacks are not as bad.  She had guilt about her past when she joined the [employer] in 1982 but got pregnant with the person who was coworker and under the pressure she had to get abortion.  She is a history of irritability, impulsive behavior, anger but noticed the medicine had helped and she like to continue.  She is no longer taking hydroxyzine  which was given by PCP.  She also started therapy but after 1 session did not go back to schedule appointment.  Patient works at [employer] past 5 years.  She reported her job is going well.  She admitted drink a glass of wine before going to bed  some nights.  She denies any intoxication.  Patient has a history of speeding ticket and recently had a court date and she told her lawyer took care of the charges.  She has a history of DUI in 2011.  She denies illegal substance use.  Her appetite is fair.  Her weight is unchanged from the past.  She denies any hallucination, paranoia, suicidal thoughts.  Past Psychiatric History: No history of inpatient, psychosis, suicidal attempt. History of abortion in 81. History of nightmares, flashback. History of heavy drinking and cocaine and had DUI in 2011. Claimed to be sober since then. Never seen psychiatrist or therapist in the past.   Past Medical History:  Diagnosis Date   Blood transfusion without reported diagnosis    1973   Cataract    beginnings   Hearing loss in right ear    Heart murmur    Hyperlipidemia    Hypertension    Hypokalemia    Muscle spasms of lower extremity 06/2020   Seasonal allergies    Vitamin D  deficiency     Outpatient Encounter Medications as of 05/25/2024  Medication Sig   cetirizine  (ZYRTEC  ALLERGY) 10 MG tablet Take 1 tablet (10 mg total) by mouth daily.   hydrochlorothiazide  (HYDRODIURIL ) 25 MG tablet Take 1 tablet (25 mg total) by mouth daily.   lidocaine  (LIDODERM ) 5 % Place 1 patch onto the skin daily. Remove & Discard patch within 12 hours or as directed by MD   loratadine  (CLARITIN ) 10 MG tablet TAKE 1 TABLET(10 MG) BY  MOUTH DAILY   naproxen  (NAPROSYN ) 375 MG tablet Take 1 tablet (375 mg total) by mouth 2 (two) times daily.   NIFEdipine  (PROCARDIA  XL/NIFEDICAL XL) 60 MG 24 hr tablet TAKE 1 TABLET(60 MG) BY MOUTH DAILY   rosuvastatin  (CRESTOR ) 20 MG tablet Take 1 tablet (20 mg total) by mouth daily.   Facility-Administered Encounter Medications as of 05/25/2024  Medication   0.9 %  sodium chloride  infusion    No results found for this or any previous visit (from the past 2160 hours).   Psychiatric Specialty Exam: Physical Exam  Review of  Systems  There were no vitals taken for this visit.There is no height or weight on file to calculate BMI.  General Appearance: Fairly Groomed  Eye Contact:  Fair  Speech:  Slow  Volume:  Decreased  Mood:  Anxious and Dysphoric  Affect:  Congruent  Thought Process:  Descriptions of Associations: Intact  Orientation:  Full (Time, Place, and Person)  Thought Content:  Rumination  Suicidal Thoughts:  No  Homicidal Thoughts:  No  Memory:  Immediate;   Fair Recent;   Good Remote;   Fair  Judgement:  Good  Insight:  Fair  Psychomotor Activity:  Decreased  Concentration:  Concentration: Fair and Attention Span: Fair  Recall:  Fair  Fund of Knowledge:  Good  Language:  Good  Akathisia:  No  Handed:  Right  AIMS (if indicated):     Assets:  Communication Skills Desire for Improvement Housing Social Support Transportation  ADL's:  Intact  Cognition:  WNL  Sleep:  fair       11/29/2023    1:41 PM 10/18/2023    9:21 AM 09/20/2023   11:23 AM 05/30/2022   11:30 AM 11/06/2021   10:34 AM  Depression screen PHQ 2/9  Decreased Interest 1 1 2  0 0  Down, Depressed, Hopeless 2 1 2  0 0  PHQ - 2 Score 3 2 4  0 0  Altered sleeping 1 2 1     Tired, decreased energy 1 2 1     Change in appetite 1 0 0    Feeling bad or failure about yourself  1 1 3     Trouble concentrating 0 1 2    Moving slowly or fidgety/restless 0 0 1    Suicidal thoughts 0 0 1    PHQ-9 Score 7 8 13     Difficult doing work/chores Somewhat difficult Somewhat difficult       Assessment/Plan: MDD (major depressive disorder), recurrent episode, mild (HCC) - Plan: amitriptyline  (ELAVIL ) 10 MG tablet  PTSD (post-traumatic stress disorder) - Plan: amitriptyline  (ELAVIL ) 10 MG tablet  Patient is a 73 year old African-American female with history of hypertension, bilateral hearing loss requires hearing aid, hyperlipidemia, PTSD and major depressive disorder.  Review collateral information and blood work results and current  medication.  Blood work in January shows triglyceride 589 and cholesterol 262.  Discussed noncompliance with medication, follow-up and not seeing therapist regularly.  Patient reported medicine helped but also making it funny when she took in the morning.  She was given amitriptyline  10 mg that she can take up to 10 to 20 mg but she is not consistent.  Discussed compliance and efficacy if taken regularly.  Encouraged not to take in the morning due to sedation and take at bedtime.  Recommend trial of a 10 mg does not help and she can take 20 mg.  Encouraged to keep appointment with therapist.  Will follow-up in 8 weeks or sooner if  needed.  Discussed watching her calorie intake, exercise, walking.  Patient has high cholesterol and triglyceride more than 500.  Recommend to call back if she has any questions.  Will follow-up in 2 months.   Follow Up Instructions:     I discussed the assessment and treatment plan with the patient. The patient was provided an opportunity to ask questions and all were answered. The patient agreed with the plan and demonstrated an understanding of the instructions.   The patient was advised to call back or seek an in-person evaluation if the symptoms worsen or if the condition fails to improve as anticipated.    Collaboration of Care: Other provider involved in patient's care AEB notes are available in epic to review  Patient/Guardian was advised Release of Information must be obtained prior to any record release in order to collaborate their care with an outside provider. Patient/Guardian was advised if they have not already done so to contact the registration department to sign all necessary forms in order for us  to release information regarding their care.   Consent: Patient/Guardian gives verbal consent for treatment and assignment of benefits for services provided during this visit. Patient/Guardian expressed understanding and agreed to proceed.     Total encounter  time 32 minutes which includes face-to-face time, chart reviewed, care coordination, order entry and documentation during this encounter.   Note: This document was prepared by [employer] voice dictation technology and any errors that results from this process are unintentional.    Leni ONEIDA Client, MD 05/25/2024

## 2024-05-28 ENCOUNTER — Telehealth: Payer: Self-pay | Admitting: Podiatry

## 2024-05-28 ENCOUNTER — Ambulatory Visit: Payer: Self-pay | Admitting: Nurse Practitioner

## 2024-05-28 NOTE — Telephone Encounter (Signed)
 Called [employer] and s/w Kecia. She said to correct the continuous date for question #8 and put duration for intermittent on question #2. I faxed back again  [phone]. The pt lft mess they had reached back out to her again about the dates.

## 2024-06-25 ENCOUNTER — Other Ambulatory Visit (HOSPITAL_COMMUNITY): Payer: Self-pay | Admitting: Physician Assistant

## 2024-06-25 DIAGNOSIS — F17200 Nicotine dependence, unspecified, uncomplicated: Secondary | ICD-10-CM

## 2024-06-26 ENCOUNTER — Other Ambulatory Visit: Payer: Self-pay

## 2024-06-26 ENCOUNTER — Telehealth: Payer: Self-pay

## 2024-06-26 NOTE — Progress Notes (Unsigned)
 06/26/2024 Name: Teresa Collier MRN: [id number] DOB: [birth year]  No chief complaint on file.   Teresa Collier is a 73 y.o. year old female who presented for a telephone visit.   They were referred to the pharmacist by their PCP for assistance in managing hyperlipidemia. PMH includes HTN, bilateral hearing loss, HLD, BMI > 30, prediabetes  Subjective: Patient was engaged by pharmacy on 04/07/24 via telephone. She reported frequently missing doses of pravastatin  due to being told she needs to take it in the evening. She was transitioned to rosuvastatin  20 mg daily. We also discussed her HTN. She reported her BP at her recent [place] appt was 122/58, so we did not make any medication adjustments. At last [facility]arma[facility] telephone appt on 05/22/24 patient reported taking her medications as prescribed, but still had not been able to monitor her BP at home due to not being able to afford a BP cuff. Her PCP appt on 05/28/24 was rescheduled for 07/06/24  Today, patient reports doing ok. ***  Care Team: Primary Care Provider: [name], NP ; 07/06/24 Patient is also seen at the [facility] in [place], [place]  Medication Access/Adherence  Current Pharmacy:  [facility] #[id number] - [name], [place] - [address] AT [facility] & [address] [place] [address] Phone: [phone] Fax: [phone]  [facility] #[id number] - [name], [place] - [address] AT [facility] OF [address] & [address] [name] [address] Phone: [phone] Fax: [phone]  [facility] #[id number] [name], [place] - [address] AT [facility] & [address] [place] [address] Phone: [phone] Fax: [phone]  [facility] - [place], [place] - [address] [place] [address] Phone: [phone] Fax: [phone]  [facility] #[id number] -  [address] [place] - [address] AT [facility] OF [address] [place] [address] Phone: [phone] Fax: [phone]   Patient reports affordability concerns with their medications: No  Patient reports access/transportation concerns to their pharmacy: No  Patient reports adherence concerns with their medications:  Yes  ***   Hypertension:  Current medications: hydrochlorothiazide  25 mg daily, nifedipine  60 mg daily  Patient has an automated, upper arm home BP cuff - reports that her cuff is old, and she is not sure where it is. Did not receive BP cuff in the mail from [facility] (they are not able to bill to Medicare). She states she cannot afford a cuff.   Current blood pressure readings readings: not currently checking. Reports BP at [facility] appt recently was 122/58 mmHg. Has not had BP rechecked since our last visit.  Patient denies hypotensive s/sx including dizziness, lightheadedness.  Patient denies hypertensive symptoms including headache, chest pain, shortness of breath  Current meal patterns: veggies, salad, rare fried foods, she doesn't eat a lot of cheese or red meat.  Denies intake of processed sugary foods. Reports sweet food, like honeybun once a month Drinks: very occasional soda - infrequently, initially reported glass of wine 3-4 times per week (today reports she has decreased slightly)  Current physical activity: walks ~ 15 minutes daily or twice daily depending on the heat   Hyperlipidemia/ASCVD Risk Reduction  Current lipid lowering medications: rosuvastatin  20 mg daily (taking in the AM with her other medications)  Antiplatelet regimen: N/A  ASCVD History:  Family History:  Uncle that died of stroke  Risk Factors: former smoker (quit 2010), HTN  Clinical ASCVD: No  The 10-year ASCVD risk score (Arnett DK, et al., 2019) is: 20.9%   Values used to calculate the score:     Age: 68 years     Clincally relevant sex: Female     Is  Non-Hispanic African American: Yes     Diabetic: No     Tobacco smoker: No     Systolic Blood Pressure: 151 mmHg     Is BP treated: Yes     HDL Cholesterol: 52 mg/dL     Total Cholesterol: 262 mg/dL    Objective:  BP at [facility] appt on 03/25/24 - 122/58 mmHg,HR 96  BP Readings from Last 3 Encounters:  01/12/24 (!) 151/87  11/29/23 (!) 136/58  09/20/23 (!) 162/85    Lab Results  Component Value Date   HGBA1C 5.6 05/05/2021    Lab Results  Component Value Date   CREATININE 0.71 11/29/2023   BUN 9 11/29/2023   NA 141 11/29/2023   K 3.8 11/29/2023   CL 100 11/29/2023   CO2 22 11/29/2023    Lab Results  Component Value Date   CHOL 262 (H) 11/29/2023   HDL 52 11/29/2023   LDLCALC 108 (H) 11/29/2023   TRIG 589 (HH) 11/29/2023   CHOLHDL 5.0 (H) 11/29/2023   *lipid panel was drawn ~2PM and was likely non-fasting. TG in prior years was 200-300 mg/dL.    Medications Reviewed Today   Medications were not reviewed in this encounter       Assessment/Plan:   Hypertension: - Currently uncontrolled with last clinic BP above goal < 130/80 mmHg. Patient reported BP was below goal at recent [facility] appt but has not been able to recheck BP since. Will provide patient with home monitor and reevaluate for escalation in therapy. - Reviewed long term cardiovascular and renal outcomes of uncontrolled blood pressure - Reviewed appropriate blood pressure monitoring technique and reviewed goal blood pressure. Recommended to check home blood pressure and heart rate once daily and keep a log once she obtains a BP cuff - Recommend to continue hydrochlorothiazide  25 mg daily, nifedipine  60 mg daily - Will collaborate with CMA at [facility] to provide patient BP cuff at next appointment  The patient has a diagnosis of hypertension and it is medically necessary for them to have access to a home device to monitor blood pressure. The patient does not have readily available insurance access to a device and  cannot afford to purchase a device at this time. The patient has been counseled that they do not need to continue to receive services from [facility] for receive a device. The patient will be given a device free of charge at next PCP appointment.  Hyperlipidemia/ASCVD Risk Reduction: - Currently uncontrolled with last LDL-C of 108 mg/dL above goal < 70 mg/dL given ASCVD risk > 79%. Last TG > 500 mg/dL which puts patient at risk for pancreatitis, however, suspect that these were non-fasting labs as they were drawn in the afternoon. Discussed foods and drinks that influence triglycerides. Provided education on s/sx of pancreatitis. I suspect that her TG would be < 500 mg/dL if she was fasting. She is tolerating high-intensity statin well. Will consider adding fibrate if TG are still > 500 mg/dL on recheck.  - Reviewed long term complications of uncontrolled cholesterol - Reviewed dietary recommendations including avoiding alcohol, sugary beverages, processed foods.  - Reviewed lifestyle recommendations including increasing physical  activity.  - Recommend to continue rosuvastatin  20 mg daily.  - Consider addition of fenofibrate if TG remain > 500 mg/dL on recheck - Repeat fasting lipid panel + direct LDL at next PCP appt 05/28/24.  Follow Up Plan:  PCP 07/06/24, Pharmacist telephone ***  Lorain Baseman, PharmD [facility] [phone]

## 2024-06-26 NOTE — Progress Notes (Signed)
 Attempted to contact patient for scheduled appointment for medication management.. VM full - unable to leave message for patient to return my call at their convenience.   Lorain Baseman, PharmD [facility] [phone]

## 2024-06-27 ENCOUNTER — Encounter (HOSPITAL_COMMUNITY): Payer: Self-pay

## 2024-06-27 ENCOUNTER — Emergency Department (HOSPITAL_COMMUNITY)
Admission: EM | Admit: 2024-06-27 | Discharge: 2024-06-27 | Disposition: A | Discharge status: Home / Self Care | Attending: Emergency Medicine | Admitting: Emergency Medicine

## 2024-06-27 ENCOUNTER — Other Ambulatory Visit: Payer: Self-pay

## 2024-06-27 DIAGNOSIS — M5441 Lumbago with sciatica, right side: Secondary | ICD-10-CM | POA: Diagnosis not present

## 2024-06-27 DIAGNOSIS — Z79899 Other long term (current) drug therapy: Secondary | ICD-10-CM | POA: Diagnosis not present

## 2024-06-27 DIAGNOSIS — M5431 Sciatica, right side: Secondary | ICD-10-CM | POA: Insufficient documentation

## 2024-06-27 DIAGNOSIS — Z87891 Personal history of nicotine dependence: Secondary | ICD-10-CM | POA: Insufficient documentation

## 2024-06-27 DIAGNOSIS — I10 Essential (primary) hypertension: Secondary | ICD-10-CM | POA: Insufficient documentation

## 2024-06-27 MED ORDER — KETOROLAC TROMETHAMINE 60 MG/2ML IM SOLN
30.0000 mg | Freq: Once | INTRAMUSCULAR | Status: AC
  Administered 2024-06-27: 30 mg via INTRAMUSCULAR
  Filled 2024-06-27: qty 2

## 2024-06-27 MED ORDER — METHYLPREDNISOLONE 4 MG PO TBPK: ORAL_TABLET | ORAL | 0 refills | Status: DC

## 2024-06-27 MED ORDER — LIDOCAINE 5 % EX PTCH
1.0000 | MEDICATED_PATCH | CUTANEOUS | Status: DC
  Administered 2024-06-27: 1 via TRANSDERMAL
  Filled 2024-06-27: qty 1

## 2024-06-27 MED ORDER — LIDOCAINE 5 % EX PTCH
3.0000 | MEDICATED_PATCH | CUTANEOUS | Status: DC
  Administered 2024-06-27: 3 via TRANSDERMAL
  Filled 2024-06-27: qty 3

## 2024-06-27 NOTE — ED Provider Notes (Cosign Needed Addendum)
 [facility] EMERGENCY DEPARTMENT AT [facility] Provider Note   CSN: [id number] Arrival date & time: 06/27/24  9088     Patient presents with: Back Pain   Teresa Collier is a 73 y.o. female.   73 year old female presenting with sciatica.  Patient tells me that she has a history of sciatica and has tried medications at home to relieve her symptoms, however she cannot member the name of the medication.  Symptoms have been worsening over the past few days.  She describes pain in her right buttocks that is worse with ambulation, denies fall/inciting event or injury.  Denies numbness/tingling in her legs, fever, bowel/bladder incontinence, saddle anesthesia.   Back Pain      Prior to Admission medications   Medication Sig Start Date End Date Taking? Authorizing Provider  methylPREDNISolone  (MEDROL  DOSEPAK) 4 MG TBPK tablet Use as indicated on packaging 06/27/24  Yes Dosha Broshears N, PA-C  amitriptyline  (ELAVIL ) 10 MG tablet Take 2 tablets (20 mg total) by mouth at bedtime. 05/25/24   Arfeen, Leni DASEN, MD  cetirizine  (ZYRTEC  ALLERGY) 10 MG tablet Take 1 tablet (10 mg total) by mouth daily. 02/27/23 05/28/23  Dreama, Georgia  N, FNP  hydrochlorothiazide  (HYDRODIURIL ) 25 MG tablet Take 1 tablet (25 mg total) by mouth daily. 05/22/24   [name], NP  lidocaine  (LIDODERM ) 5 % Place 1 patch onto the skin daily. Remove & Discard patch within 12 hours or as directed by MD 01/12/24   Kommor, Lum, MD  loratadine  (CLARITIN ) 10 MG tablet TAKE 1 TABLET(10 MG) BY MOUTH DAILY 02/03/24   [name], NP  naproxen  (NAPROSYN ) 375 MG tablet Take 1 tablet (375 mg total) by mouth 2 (two) times daily. 01/12/24   Kommor, Madison, MD  NIFEdipine  (PROCARDIA  XL/NIFEDICAL XL) 60 MG 24 hr tablet TAKE 1 TABLET(60 MG) BY MOUTH DAILY 05/20/24   Nichols, Tonya S, NP  rosuvastatin  (CRESTOR ) 20 MG tablet Take 1 tablet (20 mg total) by mouth daily. 04/07/24   Paseda, Folashade R, FNP    Allergies: Patient  has no known allergies.    Review of Systems  Musculoskeletal:  Positive for back pain.    Updated Vital Signs  Vitals:   06/27/24 0918 06/27/24 0919  BP: (!) 161/83   Pulse: 90   Resp: 18   Temp: 98.4 F (36.9 C)   TempSrc: Oral   SpO2: 94%   Weight:  82.1 kg  Height:  5' 3 (1.6 m)     Physical Exam Vitals and nursing note reviewed.  HENT:     Head: Normocephalic.  Eyes:     Extraocular Movements: Extraocular movements intact.  Cardiovascular:     Rate and Rhythm: Normal rate.  Pulmonary:     Effort: Pulmonary effort is normal.  Musculoskeletal:     Cervical back: Normal range of motion. No tenderness.     Comments: Moves all extremity spontaneously without difficulty 5/5 strength against resistance of bilateral upper and lower extremities Back: No C-spine/T-spine/L-spine TTP, no paraspinal muscle TTP Mild TTP of R lateral buttocks  Skin:    General: Skin is warm and dry.  Neurological:     Mental Status: She is alert.     Comments: No appreciable sensory deficits or weakness in bilateral lower extremities     (all labs ordered are listed, but only abnormal results are displayed) Labs Reviewed - No data to display  EKG: None  Radiology: No results found.   Procedures   Medications Ordered in  the ED  lidocaine  (LIDODERM ) 5 % 1 patch (has no administration in time range)  ketorolac  (TORADOL ) injection 30 mg (has no administration in time range)                                    Medical Decision Making This patient presents to the ED for concern of sciatica, this involves an extensive number of treatment options, and is a complaint that carries with it a high risk of complications and morbidity.  The differential diagnosis includes sciatica, degenerative disc disease, bulging versus slipped disc, cauda equina   Co morbidities that complicate the patient evaluation  Hypertension, hyperlipidemia   Additional history obtained:  Additional  history obtained from record review External records from outside source obtained and reviewed including previous PCP note   Cardiac Monitoring: / EKG:  The patient was maintained on a cardiac monitor.  I personally viewed and interpreted the cardiac monitored which showed an underlying rhythm of: NSR  Problem List / ED Course / Critical interventions / Medication management  I ordered medication including lidocaine  patch and Toradol  for back/buttocks pain Reevaluation of the patient after these medicines showed that the patient improved I have reviewed the patients home medicines and have made adjustments as needed   Social Determinants of Health:  Former tobacco use, depression, housing instability   Test / Admission - Considered:  Physical exam is largely unremarkable as above, patient does not demonstrate any red flag back pain signs/symptoms, including fever, bowel/bladder incontinence, saddle anesthesia.  There are no appreciable sensory deficits or weakness of the lower extremities on exam, she does endorse some mild tenderness to palpation of her right lateral buttocks but otherwise normal strength of bilateral lower extremities.  Will provide patient with lidocaine  patches and Toradol  in the emergency department today, will prescribe Medrol  Dosepak for sciatica exacerbation.  She was provided with extra lidocaine  patches prior to discharge, as she tells me that these have worked well for her in the past but her insurance will not cover them.  I discussed this plan in depth with the patient and she is in agreement.  I encouraged her to follow-up with her primary care provider if her symptoms persist.  Return precautions discussed, she is appropriate for discharge at this time.  Staffed with Dr. Bernard    Risk Prescription drug management.        Final diagnoses:  Sciatica of right side    ED Discharge Orders          Ordered    methylPREDNISolone  (MEDROL  DOSEPAK) 4  MG TBPK tablet        06/27/24 0959                  Teresa Rocky SAILOR, PA-C 06/27/24 1100    Bernard Drivers, MD 06/28/24 [phone]

## 2024-06-27 NOTE — ED Triage Notes (Signed)
 Reports hx of sciatica and reports flare up that started on Monday and has been taking home pain meds but not getting any better.  Reports pain goes into right buttock.  Denies incontinence

## 2024-06-27 NOTE — Discharge Instructions (Addendum)
 Continue Tylenol  as needed for pain.  Start Medrol  Dosepak, use as directed on packaging.  Follow-up with your primary care provider if your symptoms persist, return to the emergency department if your symptoms worsen.

## 2024-07-06 ENCOUNTER — Ambulatory Visit: Admitting: Nurse Practitioner

## 2024-07-07 ENCOUNTER — Telehealth: Payer: Self-pay | Admitting: Nurse Practitioner

## 2024-07-08 ENCOUNTER — Ambulatory Visit: Admitting: Nurse Practitioner

## 2024-07-08 NOTE — Telephone Encounter (Signed)
 9/9//2025 @ 12:42pm Reminder Call of appointment on 07/08/2024 @9 :[facility] vm full

## 2024-07-16 ENCOUNTER — Telehealth: Payer: Self-pay | Admitting: *Deleted

## 2024-07-16 NOTE — Progress Notes (Signed)
 Complex Care Management Care Guide Note  07/16/2024 Name: Teresa Collier MRN: [id number] DOB: January 11, 1951  Teresa Collier is a 73 y.o. year old female who is a primary care patient of [name], NP and is actively engaged with the care management team. I reached out to Krysteena Wolke by phone today to assist with re-scheduling  with the Pharmacist.  Follow up plan: Unsuccessful telephone outreach attempt made. Patient requested a return call.  Harlene Satterfield  [facility]  [facility] Guide  Direct Dial: [phone]  Fax [phone]

## 2024-07-20 ENCOUNTER — Encounter (HOSPITAL_COMMUNITY): Payer: Self-pay

## 2024-07-20 ENCOUNTER — Telehealth (HOSPITAL_BASED_OUTPATIENT_CLINIC_OR_DEPARTMENT_OTHER): Payer: Self-pay | Admitting: Psychiatry

## 2024-07-20 DIAGNOSIS — Z91199 Patient's noncompliance with other medical treatment and regimen due to unspecified reason: Secondary | ICD-10-CM

## 2024-07-20 NOTE — Progress Notes (Signed)
 Patient is no-show today.  She had missed appointments in the past.  If she is not interested to keep the appointment then we need to close the chart.  She was last seen in July 28 but after a 1 year break.

## 2024-07-21 NOTE — Progress Notes (Signed)
 Complex Care Management Care Guide Note  07/21/2024 Name: Teresa Collier MRN: [id number] DOB: 05-Feb-1951  Teresa Collier is a 73 y.o. year old female who is a primary care patient of [name], NP and is actively engaged with the care management team. I reached out to Pressley Keil by phone today to assist with re-scheduling  with the Pharmacist.  Follow up plan: Telephone appointment with complex care management team member scheduled for:  11/5 at 900 AM  Harlene Satterfield  [facility], [facility] Guide  Direct Dial: [phone]  Fax [phone]

## 2024-07-27 ENCOUNTER — Encounter (HOSPITAL_COMMUNITY): Payer: Self-pay | Admitting: Psychiatry

## 2024-07-27 ENCOUNTER — Encounter (HOSPITAL_COMMUNITY): Payer: Self-pay

## 2024-07-27 ENCOUNTER — Telehealth (HOSPITAL_COMMUNITY): Payer: Self-pay | Admitting: Psychiatry

## 2024-07-27 DIAGNOSIS — Z91199 Patient's noncompliance with other medical treatment and regimen due to unspecified reason: Secondary | ICD-10-CM

## 2024-07-27 NOTE — Telephone Encounter (Signed)
 Per provider, patient has been discharged due to multiple missed appointments. Dismissal letter has been sent via mychart and certified mail on 07/27/2024.

## 2024-07-27 NOTE — Progress Notes (Signed)
 Patient is no-show today.  Tried to call but no one picked up the phone.  Left a message.  Due to history of noncompliance with appointments we will refer her out and close the chart.

## 2024-07-30 ENCOUNTER — Encounter: Payer: Self-pay | Admitting: Nurse Practitioner

## 2024-07-30 ENCOUNTER — Ambulatory Visit: Admitting: Nurse Practitioner

## 2024-07-30 VITALS — BP 152/80 | HR 80 | Ht 63.0 in | Wt 182.0 lb

## 2024-07-30 DIAGNOSIS — Z23 Encounter for immunization: Secondary | ICD-10-CM

## 2024-07-30 DIAGNOSIS — E785 Hyperlipidemia, unspecified: Secondary | ICD-10-CM | POA: Diagnosis not present

## 2024-07-30 NOTE — Progress Notes (Signed)
 Subjective   Patient ID: Teresa Collier, female    DOB: 12-13-1950, 73 y.o.   MRN: [id number]  Chief Complaint  Patient presents with   Hyperlipidemia    Wanting labs    Referring provider: Oley Teresa RAMAN, NP  Teresa Collier is a 73 y.o. female with Past Medical History: No date: Blood transfusion without reported diagnosis     Comment:  1973 No date: Cataract     Comment:  beginnings No date: Hearing loss in right ear No date: Heart murmur No date: Hyperlipidemia No date: Hypertension No date: Hypokalemia 06/2020: Muscle spasms of lower extremity No date: Seasonal allergies No date: Vitamin D  deficiency   HPI  Patient presents today for follow-up visit for hypertension.  Patient was given a blood pressure cuff to check blood pressures at home today.  She states that she currently does not need any refills.  She does need flu and pneumonia vaccines today.  Overall doing well from last visit.  We will check labs today. Denies f/c/s, n/v/d, hemoptysis, PND, leg swelling Denies chest pain or edema     No Known Allergies  Immunization History  Administered Date(s) Administered   INFLUENZA, HIGH DOSE SEASONAL PF 07/30/2024   Influenza,inj,Quad PF,6+ Mos 08/27/2016, 12/11/2017, 09/08/2018, 09/09/2019, 07/20/2020   Influenza,inj,quad, With Preservative 10/21/2013   Influenza-Unspecified 10/21/2013, 09/04/2021, 09/20/2023   Pneumococcal Conjugate-13 09/24/2016   Pneumococcal Polysaccharide-23 07/30/2024   Tdap 10/30/2007, 09/24/2016    Tobacco History: Social History   Tobacco Use  Smoking Status Former   Current packs/day: 0.00   Average packs/day: 2.0 packs/day for 20.0 years (40.0 ttl pk-yrs)   Types: Cigarettes   Start date: 01/27/1989   Quit date: 01/27/2009   Years since quitting: 15.5  Smokeless Tobacco Never   Counseling given: Not Answered   Outpatient Encounter Medications as of 07/30/2024  Medication Sig   amitriptyline  (ELAVIL ) 10 MG tablet  Take 2 tablets (20 mg total) by mouth at bedtime.   cetirizine  (ZYRTEC  ALLERGY) 10 MG tablet Take 1 tablet (10 mg total) by mouth daily.   hydrochlorothiazide  (HYDRODIURIL ) 25 MG tablet Take 1 tablet (25 mg total) by mouth daily.   lidocaine  (LIDODERM ) 5 % Place 1 patch onto the skin daily. Remove & Discard patch within 12 hours or as directed by MD   loratadine  (CLARITIN ) 10 MG tablet TAKE 1 TABLET(10 MG) BY MOUTH DAILY   naproxen  (NAPROSYN ) 375 MG tablet Take 1 tablet (375 mg total) by mouth 2 (two) times daily.   NIFEdipine  (PROCARDIA  XL/NIFEDICAL XL) 60 MG 24 hr tablet TAKE 1 TABLET(60 MG) BY MOUTH DAILY   rosuvastatin  (CRESTOR ) 20 MG tablet Take 1 tablet (20 mg total) by mouth daily.   [DISCONTINUED] methylPREDNISolone  (MEDROL  DOSEPAK) 4 MG TBPK tablet Use as indicated on packaging   Facility-Administered Encounter Medications as of 07/30/2024  Medication   0.9 %  sodium chloride  infusion    Review of Systems  Review of Systems  Constitutional: Negative.   HENT: Negative.    Cardiovascular: Negative.   Gastrointestinal: Negative.   Allergic/Immunologic: Negative.   Neurological: Negative.   Psychiatric/Behavioral: Negative.       Objective:   BP (!) 152/80   Pulse 80   Ht 5' 3 (1.6 m)   Wt 182 lb (82.6 kg)   SpO2 98%   BMI 32.24 kg/m   Wt Readings from Last 5 Encounters:  07/30/24 182 lb (82.6 kg)  06/27/24 181 lb (82.1 kg)  11/29/23 181 lb (82.1 kg)  09/20/23  175 lb (79.4 kg)  08/23/23 175 lb (79.4 kg)     Physical Exam Vitals and nursing note reviewed.  Constitutional:      General: She is not in acute distress.    Appearance: She is well-developed.  Cardiovascular:     Rate and Rhythm: Normal rate and regular rhythm.  Pulmonary:     Effort: Pulmonary effort is normal.     Breath sounds: Normal breath sounds.  Neurological:     Mental Status: She is alert and oriented to person, place, and time.       Assessment & Plan:   Need for immunization  against influenza -     Flu vaccine HIGH DOSE PF(Fluzone Trivalent)  Hyperlipidemia, unspecified hyperlipidemia type -     LDL cholesterol, direct -     Lipid panel  Need for pneumococcal vaccination -     Pneumococcal polysaccharide vaccine 23-valent greater than or equal to 2yo subcutaneous/IM     Return in about 6 months (around 01/28/2025).    The patients has a diagnosis of hypertension and it is medically necessary for them to have access to a home device to monitor blood pressure.  The patient does not have readily available insurance access to a device and cannot afford to purchase a device at this time.  The patient has been counseled that they do not need to continue to receive services from [facility] to receive a device.  The patient will be given a device free of charge.   Teresa GORMAN Borer, NP 07/30/2024

## 2024-07-31 ENCOUNTER — Telehealth: Payer: Self-pay

## 2024-07-31 ENCOUNTER — Ambulatory Visit: Payer: Self-pay | Admitting: Nurse Practitioner

## 2024-07-31 LAB — LIPID PANEL
Chol/HDL Ratio: 2.7 ratio (ref 0.0–4.4)
Cholesterol, Total: 179 mg/dL (ref 100–199)
HDL: 66 mg/dL (ref >39)
LDL Chol Calc (NIH): 95 mg/dL (ref 0–99)
Triglycerides: 99 mg/dL (ref 0–149)
VLDL Cholesterol Cal: 18 mg/dL (ref 5–40)

## 2024-07-31 LAB — LDL CHOLESTEROL, DIRECT: LDL Direct: 82 mg/dL (ref 0–99)

## 2024-07-31 NOTE — Telephone Encounter (Signed)
 Copied from CRM [id number]. Topic: Clinical - Lab/Test Results >> Jul 31, 2024 11:25 AM Jasmin G wrote: Reason for CRM: Pt requested a call back from one of Ms. Nichols nurses to discuss recent test results. Call pt back at [phone].  Lvm For pt to call back. Kh

## 2024-08-06 ENCOUNTER — Telehealth: Payer: Self-pay | Admitting: Nurse Practitioner

## 2024-08-06 DIAGNOSIS — E782 Mixed hyperlipidemia: Secondary | ICD-10-CM

## 2024-08-06 NOTE — Progress Notes (Signed)
 Pharmacy Quality Measure Review  This patient is appearing on a report for being at risk of failing the adherence measure for cholesterol (statin) medications this calendar year.   Medication: Rosuvastatin  20mg  Last fill date: 06/10 for 90 day supply  Reviewed medication indication, dosing, and goals of therapy. Recently switched from pravastatin , which may have caused gap in therapy. All questions by patient were answered.  Angela Baalmann, PharmD [facility] Pharmacist

## 2024-08-11 ENCOUNTER — Other Ambulatory Visit: Payer: Self-pay

## 2024-08-11 ENCOUNTER — Emergency Department (HOSPITAL_COMMUNITY)
Admission: EM | Admit: 2024-08-11 | Discharge: 2024-08-11 | Disposition: A | Discharge status: Home / Self Care | Attending: Emergency Medicine | Admitting: Emergency Medicine

## 2024-08-11 ENCOUNTER — Encounter (HOSPITAL_COMMUNITY): Payer: Self-pay

## 2024-08-11 DIAGNOSIS — Z79899 Other long term (current) drug therapy: Secondary | ICD-10-CM | POA: Insufficient documentation

## 2024-08-11 DIAGNOSIS — M5441 Lumbago with sciatica, right side: Secondary | ICD-10-CM | POA: Diagnosis not present

## 2024-08-11 DIAGNOSIS — M5431 Sciatica, right side: Secondary | ICD-10-CM | POA: Insufficient documentation

## 2024-08-11 MED ORDER — METHYLPREDNISOLONE 4 MG PO TBPK: ORAL_TABLET | ORAL | 0 refills | Status: DC

## 2024-08-11 MED ORDER — ACETAMINOPHEN 500 MG PO TABS
1000.0000 mg | ORAL_TABLET | Freq: Once | ORAL | Status: AC
  Administered 2024-08-11: 1000 mg via ORAL
  Filled 2024-08-11: qty 2

## 2024-08-11 MED ORDER — KETOROLAC TROMETHAMINE 60 MG/2ML IM SOLN
30.0000 mg | Freq: Once | INTRAMUSCULAR | Status: AC
  Administered 2024-08-11: 30 mg via INTRAMUSCULAR
  Filled 2024-08-11: qty 2

## 2024-08-11 NOTE — ED Provider Notes (Signed)
 [place] EMERGENCY DEPARTMENT AT [facility] Provider Note   CSN: [id number] Arrival date & time: 08/11/24  9194     Patient presents with: Sciatica   Teresa Collier is a 73 y.o. female.   This is 73 year old female is here today with some right sided back pain.  Patient has a history of sciatica, states that it flared up couple of days ago.  She has been taking between 10 and 20 mg of prednisone  as needed over the last couple of days without significant improvement.  She denies any bowel or bladder incontinence or retention.  No difficulty walking.        Prior to Admission medications   Medication Sig Start Date End Date Taking? Authorizing Provider  methylPREDNISolone  (MEDROL  DOSEPAK) 4 MG TBPK tablet Take as instructed by dose packaging 08/11/24  Yes Mannie Pac T, DO  amitriptyline  (ELAVIL ) 10 MG tablet Take 2 tablets (20 mg total) by mouth at bedtime. 05/25/24   Arfeen, Leni DASEN, MD  cetirizine  (ZYRTEC  ALLERGY) 10 MG tablet Take 1 tablet (10 mg total) by mouth daily. 02/27/23 07/30/24  Dreama, Georgia  N, FNP  hydrochlorothiazide  (HYDRODIURIL ) 25 MG tablet Take 1 tablet (25 mg total) by mouth daily. 05/22/24   [name], NP  lidocaine  (LIDODERM ) 5 % Place 1 patch onto the skin daily. Remove & Discard patch within 12 hours or as directed by MD 01/12/24   Kommor, Lum, MD  loratadine  (CLARITIN ) 10 MG tablet TAKE 1 TABLET(10 MG) BY MOUTH DAILY 02/03/24   Nichols, Tonya S, NP  naproxen  (NAPROSYN ) 375 MG tablet Take 1 tablet (375 mg total) by mouth 2 (two) times daily. 01/12/24   Kommor, Madison, MD  NIFEdipine  (PROCARDIA  XL/NIFEDICAL XL) 60 MG 24 hr tablet TAKE 1 TABLET(60 MG) BY MOUTH DAILY 05/20/24   Nichols, Tonya S, NP  rosuvastatin  (CRESTOR ) 20 MG tablet Take 1 tablet (20 mg total) by mouth daily. 04/07/24   Paseda, Folashade R, FNP    Allergies: Patient has no known allergies.    Review of Systems  Updated Vital Signs BP (!) 143/118 (BP Location:  Left Arm)   Pulse 87   Temp 98.3 F (36.8 C)   Resp 17   SpO2 98%   Physical Exam Vitals and nursing note reviewed.  Musculoskeletal:        General: Normal range of motion.  Neurological:     General: No focal deficit present.     Mental Status: She is alert.     (all labs ordered are listed, but only abnormal results are displayed) Labs Reviewed - No data to display  EKG: None  Radiology: No results found.   Procedures   Medications Ordered in the ED  ketorolac  (TORADOL ) injection 30 mg (has no administration in time range)  acetaminophen  (TYLENOL ) tablet 1,000 mg (has no administration in time range)                                    Medical Decision Making 73 year old female here today with right-sided back pain.  Differential diagnosis includes sciatica, less likely cord compression, less likely malignancy, less likely nephro lithiasis, less likely pyelonephritis.  Plan-patient with a history of sciatica, your exam is consistent with this.  I considered imaging, however do not believe is indicated given her reassuring exam and history.  She does not have any numbness or tingling in her lower extremities.  She does  not have any weakness.  She has no difficulty with her gait.  No saddle anesthesia.  Patient has been taking suboptimal doses of her steroids.  Will provide her some analgesia here in the emergency room and send her prescription for Medrol .  Reviewed the patient's most recent urgent care outpatient note.  This patient's health care is complicated by the following social determinants of health-lack of access to primary care.  Risk OTC drugs. Prescription drug management.        Final diagnoses:  Sciatica of right side    ED Discharge Orders          Ordered    methylPREDNISolone  (MEDROL  DOSEPAK) 4 MG TBPK tablet        08/11/24 0902               Mannie Pac T, DO 08/11/24 [phone]

## 2024-08-11 NOTE — Discharge Instructions (Addendum)
 I have sent you a prescription for a steroid pack.  You may take it as instructed by the dose packaging until they run out.  You may also take 1000 mg of Tylenol  every 8 hours.  Please follow-up with your primary care doctor.

## 2024-08-11 NOTE — ED Triage Notes (Signed)
 Pt reports with a sciatica flare up since Sunday. Pt has been resting and taking prednisone  with no relief. Pt has pain to her right lower back, buttock, and right leg.

## 2024-08-19 DIAGNOSIS — H524 Presbyopia: Secondary | ICD-10-CM | POA: Diagnosis not present

## 2024-08-19 DIAGNOSIS — H5202 Hypermetropia, left eye: Secondary | ICD-10-CM | POA: Diagnosis not present

## 2024-08-19 DIAGNOSIS — Z961 Presence of intraocular lens: Secondary | ICD-10-CM | POA: Diagnosis not present

## 2024-08-19 DIAGNOSIS — H52223 Regular astigmatism, bilateral: Secondary | ICD-10-CM | POA: Diagnosis not present

## 2024-08-20 ENCOUNTER — Encounter (HOSPITAL_COMMUNITY): Payer: Self-pay | Admitting: Emergency Medicine

## 2024-08-20 ENCOUNTER — Emergency Department (HOSPITAL_COMMUNITY)
Admission: EM | Admit: 2024-08-20 | Discharge: 2024-08-20 | Disposition: A | Discharge status: Home / Self Care | Attending: Emergency Medicine | Admitting: Emergency Medicine

## 2024-08-20 ENCOUNTER — Other Ambulatory Visit: Payer: Self-pay

## 2024-08-20 DIAGNOSIS — M5431 Sciatica, right side: Secondary | ICD-10-CM | POA: Diagnosis not present

## 2024-08-20 DIAGNOSIS — M5441 Lumbago with sciatica, right side: Secondary | ICD-10-CM | POA: Diagnosis not present

## 2024-08-20 MED ORDER — LIDOCAINE 5 % EX PTCH: 1.0000 | MEDICATED_PATCH | CUTANEOUS | 0 refills | Status: AC

## 2024-08-20 MED ORDER — PREDNISONE 10 MG (21) PO TBPK: ORAL_TABLET | Freq: Every day | ORAL | 0 refills | Status: DC

## 2024-08-20 MED ORDER — LIDOCAINE 5 % EX PTCH
1.0000 | MEDICATED_PATCH | CUTANEOUS | Status: DC
  Administered 2024-08-20: 1 via TRANSDERMAL
  Filled 2024-08-20: qty 1

## 2024-08-20 MED ORDER — GABAPENTIN 300 MG PO CAPS: 300.0000 mg | ORAL_CAPSULE | Freq: Three times a day (TID) | ORAL | 0 refills | Status: AC

## 2024-08-20 NOTE — ED Triage Notes (Signed)
 Pt reports posterior right leg pain that radiates down the leg. Hx of sciatica.

## 2024-08-20 NOTE — ED Provider Notes (Signed)
 [place] EMERGENCY DEPARTMENT AT [facility] Provider Note   CSN: [id number] Arrival date & time: 08/20/24  9073     Patient presents with: Leg Pain   Teresa Collier is a 73 y.o. female.   This is a 73 year old female with history of sciatica presents with pain to her right leg.  Pain starts in the back With thigh area and goes down her leg to the foot.  Denies a history of trauma.  No back pain.  No bowel or bladder dysfunction.  No urinary symptoms.  Pain is better with rest.  This is similar to her prior episodes of sciatica.  Denies any history of trauma       Prior to Admission medications   Medication Sig Start Date End Date Taking? Authorizing Provider  amitriptyline  (ELAVIL ) 10 MG tablet Take 2 tablets (20 mg total) by mouth at bedtime. 05/25/24   Arfeen, Leni DASEN, MD  cetirizine  (ZYRTEC  ALLERGY) 10 MG tablet Take 1 tablet (10 mg total) by mouth daily. 02/27/23 07/30/24  Dreama, Georgia  N, FNP  hydrochlorothiazide  (HYDRODIURIL ) 25 MG tablet Take 1 tablet (25 mg total) by mouth daily. 05/22/24   [name], NP  lidocaine  (LIDODERM ) 5 % Place 1 patch onto the skin daily. Remove & Discard patch within 12 hours or as directed by MD 01/12/24   Kommor, Lum, MD  loratadine  (CLARITIN ) 10 MG tablet TAKE 1 TABLET(10 MG) BY MOUTH DAILY 02/03/24   [name], NP  methylPREDNISolone  (MEDROL  DOSEPAK) 4 MG TBPK tablet Take as instructed by dose packaging 08/11/24   Mannie Fairy DASEN, DO  naproxen  (NAPROSYN ) 375 MG tablet Take 1 tablet (375 mg total) by mouth 2 (two) times daily. 01/12/24   Kommor, Madison, MD  NIFEdipine  (PROCARDIA  XL/NIFEDICAL XL) 60 MG 24 hr tablet TAKE 1 TABLET(60 MG) BY MOUTH DAILY 05/20/24   Nichols, Tonya S, NP  rosuvastatin  (CRESTOR ) 20 MG tablet Take 1 tablet (20 mg total) by mouth daily. 04/07/24   Paseda, Folashade R, FNP    Allergies: Patient has no known allergies.    Review of Systems  All other systems reviewed and are  negative.   Updated Vital Signs BP (!) 187/81   Pulse 82   Temp 98.6 F (37 C) (Oral)   Resp 17   SpO2 93%   Physical Exam Vitals and nursing note reviewed.  Constitutional:      General: She is not in acute distress.    Appearance: Normal appearance. She is well-developed. She is not toxic-appearing.  HENT:     Head: Normocephalic and atraumatic.  Eyes:     General: Lids are normal.     Conjunctiva/sclera: Conjunctivae normal.     Pupils: Pupils are equal, round, and reactive to light.  Neck:     Thyroid : No thyroid  mass.     Trachea: No tracheal deviation.  Cardiovascular:     Rate and Rhythm: Normal rate and regular rhythm.     Heart sounds: Normal heart sounds. No murmur heard.    No gallop.  Pulmonary:     Effort: Pulmonary effort is normal. No respiratory distress.     Breath sounds: Normal breath sounds. No stridor. No decreased breath sounds, wheezing, rhonchi or rales.  Abdominal:     General: There is no distension.     Palpations: Abdomen is soft.     Tenderness: There is no abdominal tenderness. There is no rebound.  Musculoskeletal:        General: No  tenderness. Normal range of motion.     Cervical back: Normal range of motion and neck supple.       Legs:  Skin:    General: Skin is warm and dry.     Findings: No abrasion or rash.  Neurological:     Mental Status: She is alert and oriented to person, place, and time. Mental status is at baseline.     GCS: GCS eye subscore is 4. GCS verbal subscore is 5. GCS motor subscore is 6.     Cranial Nerves: No cranial nerve deficit.     Sensory: No sensory deficit.     Motor: Motor function is intact.  Psychiatric:        Attention and Perception: Attention normal.        Speech: Speech normal.        Behavior: Behavior normal.     (all labs ordered are listed, but only abnormal results are displayed) Labs Reviewed - No data to display  EKG: None  Radiology: No results found.   Procedures    Medications Ordered in the ED - No data to display                                  Medical Decision Making  Patient's symptoms consistent with sciatica.  Low suspicion for serious etiology such as cauda equina.  Will give patient, Lidoderm  gabapentin as well as prescribed prednisone .  Has an appointment to see her doctor in a few weeks     Final diagnoses:  None    ED Discharge Orders     None          Dasie Faden, MD 08/20/24 1006

## 2024-09-02 ENCOUNTER — Telehealth: Payer: Self-pay

## 2024-09-02 ENCOUNTER — Other Ambulatory Visit: Payer: Self-pay

## 2024-09-02 NOTE — Progress Notes (Deleted)
 09/02/2024 Name: Teresa Collier MRN: [id number] DOB: 1951/06/17  No chief complaint on file.   Teresa Collier is a 73 y.o. year old female who presented for a telephone visit.   They were referred to the pharmacist by their PCP for assistance in managing hyperlipidemia. PMH includes HTN, bilateral hearing loss, HLD, BMI > 30, prediabetes  Subjective: Patient was engaged by pharmacy on 04/07/24 via telephone. She reported frequently missing doses of pravastatin  due to being told she needs to take it in the evening. She was transitioned to rosuvastatin  20 mg daily. We also discussed her HTN. She reported her BP at her recent [place] appt was 122/58, so we did not make any medication adjustments. At last pharmafy telephone appt on 05/22/24 patient reported taking her medications as prescribed, but still had not been able to monitor her BP at home due to not being able to afford a BP cuff. Her PCP appt on 05/28/24 was rescheduled for 07/06/24  Today, patient reports doing ok. ***  Care Team: Primary Care Provider: [name], NP ; 07/06/24 Patient is also seen at the [facility] in [place], [place]  Medication Access/Adherence  Current Pharmacy:  [facility] #[id number] - [name], [place] - [address] AT [facility] OF [facility] & [address] [place] [address] Phone: [phone] Fax: [phone]  [facility] #[id number] - [name], [place] - [address] AT [facility] OF [address] & [address] [name] [address] Phone: [phone] Fax: [phone]  [facility] #[id number] [name], [place] - [address] AT [facility] OF [facility] & [address] [place] [address] Phone: [phone] Fax: [phone]  [facility] - [place], [place] - [address] [place] [address] Phone: [phone] Fax: [phone]  [facility] #[id number] -  [address] [place] - [address] AT [facility] OF [address] [place] [address] Phone: [phone] Fax: [phone]   Patient reports affordability concerns with their medications: No  Patient reports access/transportation concerns to their pharmacy: No  Patient reports adherence concerns with their medications:  Yes  ***   Hypertension:  Current medications: hydrochlorothiazide  25 mg daily, nifedipine  60 mg daily  Patient has an automated, upper arm home BP cuff - reports that her cuff is old, and she is not sure where it is. Did not receive BP cuff in the mail from [employer] (they are not able to bill to Medicare). She states she cannot afford a cuff.   Current blood pressure readings readings: not currently checking. Reports BP at [facility] appt recently was 122/58 mmHg. Has not had BP rechecked since our last visit.  Patient denies hypotensive s/sx including dizziness, lightheadedness.  Patient denies hypertensive symptoms including headache, chest pain, shortness of breath  Current meal patterns: veggies, salad, rare fried foods, she doesn't eat a lot of cheese or red meat.  Denies intake of processed sugary foods. Reports sweet food, like honeybun once a month Drinks: very occasional soda - infrequently, initially reported glass of wine 3-4 times per week (today reports she has decreased slightly)  Current physical activity: walks ~ 15 minutes daily or twice daily depending on the heat   Hyperlipidemia/ASCVD Risk Reduction  Current lipid lowering medications: rosuvastatin  20 mg daily (taking in the AM with her other medications)  Antiplatelet regimen: N/A  ASCVD History:  Family History:  Uncle that died of stroke  Risk Factors: former smoker (quit 2010), HTN  Clinical ASCVD: No  The 10-year ASCVD risk score (Arnett DK, et al., 2019) is: 22.4%   Values used to calculate the score:     Age: 6 years     Clincally relevant sex: Female     Is  Non-Hispanic African American: Yes     Diabetic: No     Tobacco smoker: No     Systolic Blood Pressure: 187 mmHg     Is BP treated: Yes     HDL Cholesterol: 66 mg/dL     Total Cholesterol: 179 mg/dL    Objective:  BP at [facility] appt on 03/25/24 - 122/58 mmHg,HR 96  BP Readings from Last 3 Encounters:  08/20/24 (!) 187/81  08/11/24 (!) 140/90  07/30/24 (!) 152/80    Lab Results  Component Value Date   HGBA1C 5.6 05/05/2021    Lab Results  Component Value Date   CREATININE 0.71 11/29/2023   BUN 9 11/29/2023   NA 141 11/29/2023   K 3.8 11/29/2023   CL 100 11/29/2023   CO2 22 11/29/2023    Lab Results  Component Value Date   CHOL 179 07/30/2024   HDL 66 07/30/2024   LDLCALC 95 07/30/2024   LDLDIRECT 82 07/30/2024   TRIG 99 07/30/2024   CHOLHDL 2.7 07/30/2024   *lipid panel was drawn ~2PM and was likely non-fasting. TG in prior years was 200-300 mg/dL.    Medications Reviewed Today   Medications were not reviewed in this encounter       Assessment/Plan:   Hypertension: - Currently uncontrolled with last clinic BP above goal < 130/80 mmHg. Patient reported BP was below goal at recent [facility] appt but has not been able to recheck BP since. Will provide patient with home monitor and reevaluate for escalation in therapy. - Reviewed long term cardiovascular and renal outcomes of uncontrolled blood pressure - Reviewed appropriate blood pressure monitoring technique and reviewed goal blood pressure. Recommended to check home blood pressure and heart rate once daily and keep a log once she obtains a BP cuff - Recommend to continue hydrochlorothiazide  25 mg daily, nifedipine  60 mg daily - Will collaborate with CMA at [facility] to provide patient BP cuff at next appointment  The patient has a diagnosis of hypertension and it is medically necessary for them to have access to a home device to monitor blood pressure. The patient does not have readily available insurance access to a device  and cannot afford to purchase a device at this time. The patient has been counseled that they do not need to continue to receive services from [facility] for receive a device. The patient will be given a device free of charge at next PCP appointment.  Hyperlipidemia/ASCVD Risk Reduction: - Currently uncontrolled with last LDL-C of 108 mg/dL above goal < 70 mg/dL given ASCVD risk > 79%. Last TG > 500 mg/dL which puts patient at risk for pancreatitis, however, suspect that these were non-fasting labs as they were drawn in the afternoon. Discussed foods and drinks that influence triglycerides. Provided education on s/sx of pancreatitis. I suspect that her TG would be < 500 mg/dL if she was fasting. She is tolerating high-intensity statin well. Will consider adding fibrate if TG are still > 500 mg/dL on recheck.  - Reviewed long term complications of uncontrolled cholesterol - Reviewed dietary recommendations including avoiding alcohol, sugary beverages, processed foods.  - Reviewed lifestyle recommendations including increasing  physical activity.  - Recommend to continue rosuvastatin  20 mg daily.  - Consider addition of fenofibrate if TG remain > 500 mg/dL on recheck - Repeat fasting lipid panel + direct LDL at next PCP appt 05/28/24.  Follow Up Plan:  PCP 07/06/24, Pharmacist telephone ***  Lorain Baseman, PharmD [facility] [phone]

## 2024-09-02 NOTE — Telephone Encounter (Signed)
 Attempted to contact patient for scheduled appointment for medication management. Left HIPAA compliant message for patient to return my call at their convenience.   Lorain Baseman, PharmD [facility] [phone]

## 2024-09-02 NOTE — Progress Notes (Signed)
 Complex Care Management Care Guide Note  09/02/2024 Name: Teresa Collier MRN: [id number] DOB: 1951-04-02  Teresa Collier is a 73 y.o. year old female who is a primary care patient of [name], NP and is actively engaged with the care management team. I reached out to Candela Barse by phone today to assist with re-scheduling  with the Pharmacist.  Follow up plan: Unsuccessful telephone outreach attempt made. A HIPAA compliant phone message was left for the patient providing contact information and requesting a return call.  Leotis Rase [facility], [facility] Guide  Direct Dial: [phone]  Fax [phone]

## 2024-10-28 ENCOUNTER — Telehealth: Admitting: Physician Assistant

## 2024-10-28 ENCOUNTER — Encounter: Payer: Self-pay | Admitting: Physician Assistant

## 2024-10-28 DIAGNOSIS — J069 Acute upper respiratory infection, unspecified: Secondary | ICD-10-CM

## 2024-10-28 MED ORDER — BENZONATATE 100 MG PO CAPS: 100.0000 mg | ORAL_CAPSULE | Freq: Three times a day (TID) | ORAL | 0 refills | Status: AC | PRN

## 2024-10-28 MED ORDER — IPRATROPIUM BROMIDE 0.03 % NA SOLN: 2.0000 | Freq: Two times a day (BID) | NASAL | 0 refills | Status: AC

## 2024-10-28 NOTE — Patient Instructions (Signed)
 " Cybill Kwong, thank you for joining Teresa Velma Lunger, PA-C for today's virtual visit.  While this provider is not your primary care provider (PCP), if your PCP is located in our provider database this encounter information will be shared with them immediately following your visit.   A [facility] MyChart account gives you access to today's visit and all your visits, tests, and labs performed at [facility]  click here if you don't have a  MyChart account or go to [email or web address]  Consent: (Patient) Teresa Collier provided verbal consent for this virtual visit at the beginning of the encounter.  Current Medications:  Current Outpatient Medications:    amitriptyline  (ELAVIL ) 10 MG tablet, Take 2 tablets (20 mg total) by mouth at bedtime., Disp: 60 tablet, Rfl: 1   cetirizine  (ZYRTEC  ALLERGY) 10 MG tablet, Take 1 tablet (10 mg total) by mouth daily., Disp: 30 tablet, Rfl: 2   gabapentin  (NEURONTIN ) 300 MG capsule, Take 1 capsule (300 mg total) by mouth 3 (three) times daily., Disp: 30 capsule, Rfl: 0   hydrochlorothiazide  (HYDRODIURIL ) 25 MG tablet, Take 1 tablet (25 mg total) by mouth daily., Disp: 90 tablet, Rfl: 3   lidocaine  (LIDODERM ) 5 %, Place 1 patch onto the skin daily. Remove & Discard patch within 12 hours or as directed by MD, Disp: 30 patch, Rfl: 0   lidocaine  (LIDODERM ) 5 %, Place 1 patch onto the skin daily. Remove & Discard patch within 12 hours or as directed by MD, Disp: 30 patch, Rfl: 0   loratadine  (CLARITIN ) 10 MG tablet, TAKE 1 TABLET(10 MG) BY MOUTH DAILY, Disp: 90 tablet, Rfl: 3   methylPREDNISolone  (MEDROL  DOSEPAK) 4 MG TBPK tablet, Take as instructed by dose packaging, Disp: 1 each, Rfl: 0   naproxen  (NAPROSYN ) 375 MG tablet, Take 1 tablet (375 mg total) by mouth 2 (two) times daily., Disp: 20 tablet, Rfl: 0   NIFEdipine  (PROCARDIA  XL/NIFEDICAL XL) 60 MG 24 hr tablet, TAKE 1 TABLET(60 MG) BY MOUTH DAILY, Disp: 30 tablet, Rfl: 0    predniSONE  (STERAPRED UNI-PAK 21 TAB) 10 MG (21) TBPK tablet, Take by mouth daily. Take 6 tabs by mouth daily  for 2 days, then 5 tabs for 2 days, then 4 tabs for 2 days, then 3 tabs for 2 days, 2 tabs for 2 days, then 1 tab by mouth daily for 2 days, Disp: 42 tablet, Rfl: 0   rosuvastatin  (CRESTOR ) 20 MG tablet, Take 1 tablet (20 mg total) by mouth daily., Disp: 90 tablet, Rfl: 3  Current Facility-Administered Medications:    0.9 %  sodium chloride  infusion, 500 mL, Intravenous, Continuous, Armbruster, Elspeth SQUIBB, MD   Medications ordered in this encounter:  No orders of the defined types were placed in this encounter.    *If you need refills on other medications prior to your next appointment, please contact your pharmacy*  Follow-Up: Call back or seek an in-person evaluation if the symptoms worsen or if the condition fails to improve as anticipated.   Virtual Care [phone]  Other Instructions Make sure to take a home COVID/flu test as directed, notifying us  of the results ASAP.  Regardless of result, make sure to hydrate and rest. The saline nasal rinse to flush out nasal passages. Okay to continue your OTC medications. Start the prescribed medications as directed. We will add on further treatments based on your test results. If you note any non-resolving, new, or worsening symptoms despite treatment, please seek an in-person evaluation ASAP.  If you have been instructed to have an in-person evaluation today at a local Urgent Care facility, please use the link below. It will take you to a list of all of our available [facility], including address, phone number and hours of operation. Please do not delay care.  [facility]  If you or a family member do not have a primary care provider, use the link below to schedule a visit and establish care. When you choose a [facility] primary care physician or advanced practice provider, you gain a  long-term partner in health. Find a Primary Care Provider  Learn more about [facility]'s in-office and virtual care options: [facility] - Get Care Now  "

## 2024-10-28 NOTE — Progress Notes (Signed)
 " Virtual Visit Consent   Teresa Collier, you are scheduled for a virtual visit with a [facility] provider today. Just as with appointments in the office, your consent must be obtained to participate. Your consent will be active for this visit and any virtual visit you may have with one of our providers in the next 365 days. If you have a MyChart account, a copy of this consent can be sent to you electronically.  As this is a virtual visit, video technology does not allow for your provider to perform a traditional examination. This may limit your provider's ability to fully assess your condition. If your provider identifies any concerns that need to be evaluated in person or the need to arrange testing (such as labs, EKG, etc.), we will make arrangements to do so. Although advances in technology are sophisticated, we cannot ensure that it will always work on either your end or our end. If the connection with a video visit is poor, the visit may have to be switched to a telephone visit. With either a video or telephone visit, we are not always able to ensure that we have a secure connection.  By engaging in this virtual visit, you consent to the provision of healthcare and authorize for your insurance to be billed (if applicable) for the services provided during this visit. Depending on your insurance coverage, you may receive a charge related to this service.  I need to obtain your verbal consent now. Are you willing to proceed with your visit today? Teresa Collier has provided verbal consent on 10/28/2024 for a virtual visit (video or telephone). Teresa Collier, [place]  Date: 10/28/2024 2:48 PM   Virtual Visit via Video Note   I, Teresa Collier, connected with  Gara Hosier  ([id number], 1951-06-08) on 10/28/2024 at  2:00 PM EST by a video-enabled telemedicine application and verified that I am speaking with the correct person using two identifiers.  Location: Patient: Virtual Visit  Location Patient: Home Provider: Virtual Visit Location Provider: Home Office   I discussed the limitations of evaluation and management by telemedicine and the availability of in person appointments. The patient expressed understanding and agreed to proceed.    History of Present Illness: Teresa Collier is a 73 y.o. who identifies as a female who was assigned female at birth, and is being seen today for URI symptoms starting Sunday with some mild chills, nasal congestion and drainage.  Think she felt a little hot Sunday night, but no fever.  Notes a slightly growingly stomach without nausea or diarrhea.  Denies sinus pain, chest pain or shortness of breath.  Denies recent travel.  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  Has not taken a home COVID or flu test.  OTC --Tylenol , loratadine   HPI: HPI  Problems:  Patient Active Problem List   Diagnosis Date Noted   Upper respiratory tract infection 11/01/2022   Combined forms of age-related cataract of left eye 08/16/2022   Healthcare maintenance 05/30/2022   Shortness of breath 11/02/2019   Vitamin D  deficiency 11/02/2019   Right lower quadrant abdominal pain 09/09/2019   Bilateral hearing loss 03/09/2019   Cataract 03/09/2019   Seasonal allergies 03/09/2019   Essential hypertension 10/16/2016   Hyperlipidemia 10/16/2016   Closed disp fracture of left lateral malleolus with routine healing 08/23/2016   Acute right-sided low back pain without sciatica 05/29/2016   Prediabetes 05/29/2016   Obesity (BMI 30-39.9) 12/05/2015    Allergies: Allergies[1] Medications: Current Medications[2]  Observations/Objective: Patient is well-developed, well-nourished in no acute distress.  Resting comfortably  at home.  Head is normocephalic, atraumatic.  No labored breathing.  Speech is clear and coherent with logical content.  Patient is alert and oriented at baseline.   Assessment and Plan: 1. Viral URI with cough (Primary) - ipratropium  (ATROVENT) 0.03 % nasal spray; Place 2 sprays into both nostrils every 12 (twelve) hours.  Dispense: 30 mL; Refill: 0 - benzonatate  (TESSALON ) 100 MG capsule; Take 1 capsule (100 mg total) by mouth 3 (three) times daily as needed for cough.  Dispense: 30 capsule; Refill: 0  Will have her take a home COVID/flu test as a precaution, especially given age over 26 and medical history.  Supportive measures and OTC medications reviewed.  Will start Atrovent nasal spray and Tessalon  per orders.  She is to notify us  ASAP of her test results so further adjustments in treatment can be made.  Strict in person evaluation precautions reviewed with patient.  Follow Up Instructions: I discussed the assessment and treatment plan with the patient. The patient was provided an opportunity to ask questions and all were answered. The patient agreed with the plan and demonstrated an understanding of the instructions.  A copy of instructions were sent to the patient via MyChart unless otherwise noted below.   The patient was advised to call back or seek an in-person evaluation if the symptoms worsen or if the condition fails to improve as anticipated.    Teresa Velma Lunger, PA-C    [1] No Known Allergies [2]  Current Outpatient Medications:    benzonatate  (TESSALON ) 100 MG capsule, Take 1 capsule (100 mg total) by mouth 3 (three) times daily as needed for cough., Disp: 30 capsule, Rfl: 0   ipratropium (ATROVENT) 0.03 % nasal spray, Place 2 sprays into both nostrils every 12 (twelve) hours., Disp: 30 mL, Rfl: 0   amitriptyline  (ELAVIL ) 10 MG tablet, Take 2 tablets (20 mg total) by mouth at bedtime., Disp: 60 tablet, Rfl: 1   cetirizine  (ZYRTEC  ALLERGY) 10 MG tablet, Take 1 tablet (10 mg total) by mouth daily., Disp: 30 tablet, Rfl: 2   gabapentin  (NEURONTIN ) 300 MG capsule, Take 1 capsule (300 mg total) by mouth 3 (three) times daily., Disp: 30 capsule, Rfl: 0   hydrochlorothiazide  (HYDRODIURIL ) 25 MG tablet, Take 1  tablet (25 mg total) by mouth daily., Disp: 90 tablet, Rfl: 3   lidocaine  (LIDODERM ) 5 %, Place 1 patch onto the skin daily. Remove & Discard patch within 12 hours or as directed by MD, Disp: 30 patch, Rfl: 0   lidocaine  (LIDODERM ) 5 %, Place 1 patch onto the skin daily. Remove & Discard patch within 12 hours or as directed by MD, Disp: 30 patch, Rfl: 0   loratadine  (CLARITIN ) 10 MG tablet, TAKE 1 TABLET(10 MG) BY MOUTH DAILY, Disp: 90 tablet, Rfl: 3   naproxen  (NAPROSYN ) 375 MG tablet, Take 1 tablet (375 mg total) by mouth 2 (two) times daily., Disp: 20 tablet, Rfl: 0   NIFEdipine  (PROCARDIA  XL/NIFEDICAL XL) 60 MG 24 hr tablet, TAKE 1 TABLET(60 MG) BY MOUTH DAILY, Disp: 30 tablet, Rfl: 0   rosuvastatin  (CRESTOR ) 20 MG tablet, Take 1 tablet (20 mg total) by mouth daily., Disp: 90 tablet, Rfl: 3  Current Facility-Administered Medications:    0.9 %  sodium chloride  infusion, 500 mL, Intravenous, Continuous, Armbruster, Elspeth SQUIBB, MD  "

## 2024-11-26 ENCOUNTER — Telehealth: Payer: Self-pay | Admitting: Pharmacy Technician

## 2024-11-26 NOTE — Progress Notes (Signed)
" ° °  11/26/2024  Patient ID: Teresa Collier, female   DOB: August 22, 1951, 74 y.o.   MRN: [id number]  Pharmacy Quality Measure Review  This patient failed the 2025 adherence measure for cholesterol (statin) medications this calendar year.   Medication: Rosuvastatin  40mg  (take 1/2 tablet (=20mg ) daily Last fill date: 07/22/2024 for 90 day supply at [facility]  Patient declined pharmacy outreach. [facility]. [name], CPhT [facility] Office: [phone] Email: [name]   "
